# Patient Record
Sex: Male | Born: 1950 | Race: White | Hispanic: No | Marital: Married | Smoking: Never smoker
Health system: Southern US, Community
[De-identification: ages and names within clinical notes are randomized; demographics above are authoritative.]

## PROBLEM LIST (undated history)

## (undated) DIAGNOSIS — I10 Essential (primary) hypertension: Secondary | ICD-10-CM

## (undated) DIAGNOSIS — E785 Hyperlipidemia, unspecified: Secondary | ICD-10-CM

## (undated) DIAGNOSIS — K635 Polyp of colon: Secondary | ICD-10-CM

## (undated) HISTORY — DX: Essential (primary) hypertension: I10

## (undated) HISTORY — DX: Polyp of colon: K63.5

## (undated) HISTORY — DX: Hyperlipidemia, unspecified: E78.5

## (undated) HISTORY — PX: ANKLE FUSION: SHX881

---

## 2007-01-03 ENCOUNTER — Ambulatory Visit: Payer: Self-pay | Admitting: Nurse Practitioner

## 2012-11-05 DIAGNOSIS — Z981 Arthrodesis status: Secondary | ICD-10-CM | POA: Insufficient documentation

## 2012-11-05 DIAGNOSIS — M19072 Primary osteoarthritis, left ankle and foot: Secondary | ICD-10-CM | POA: Insufficient documentation

## 2016-10-11 ENCOUNTER — Encounter (HOSPITAL_COMMUNITY): Payer: Self-pay

## 2016-10-11 ENCOUNTER — Encounter (HOSPITAL_COMMUNITY): Payer: BLUE CROSS/BLUE SHIELD

## 2016-10-11 ENCOUNTER — Encounter (HOSPITAL_COMMUNITY): Payer: BLUE CROSS/BLUE SHIELD | Attending: Oncology | Admitting: Oncology

## 2016-10-11 DIAGNOSIS — D709 Neutropenia, unspecified: Secondary | ICD-10-CM | POA: Diagnosis present

## 2016-10-11 DIAGNOSIS — Z833 Family history of diabetes mellitus: Secondary | ICD-10-CM | POA: Diagnosis not present

## 2016-10-11 DIAGNOSIS — Z7982 Long term (current) use of aspirin: Secondary | ICD-10-CM | POA: Insufficient documentation

## 2016-10-11 DIAGNOSIS — D708 Other neutropenia: Secondary | ICD-10-CM

## 2016-10-11 DIAGNOSIS — Z9889 Other specified postprocedural states: Secondary | ICD-10-CM | POA: Insufficient documentation

## 2016-10-11 DIAGNOSIS — Z823 Family history of stroke: Secondary | ICD-10-CM | POA: Diagnosis not present

## 2016-10-11 DIAGNOSIS — Z79899 Other long term (current) drug therapy: Secondary | ICD-10-CM | POA: Insufficient documentation

## 2016-10-11 DIAGNOSIS — Z8249 Family history of ischemic heart disease and other diseases of the circulatory system: Secondary | ICD-10-CM | POA: Diagnosis not present

## 2016-10-11 DIAGNOSIS — E785 Hyperlipidemia, unspecified: Secondary | ICD-10-CM | POA: Insufficient documentation

## 2016-10-11 DIAGNOSIS — I10 Essential (primary) hypertension: Secondary | ICD-10-CM | POA: Diagnosis not present

## 2016-10-11 LAB — COMPREHENSIVE METABOLIC PANEL
ALK PHOS: 75 U/L (ref 38–126)
ALT: 18 U/L (ref 17–63)
AST: 24 U/L (ref 15–41)
Albumin: 3.8 g/dL (ref 3.5–5.0)
Anion gap: 7 (ref 5–15)
BILIRUBIN TOTAL: 0.8 mg/dL (ref 0.3–1.2)
BUN: 13 mg/dL (ref 6–20)
CALCIUM: 8.9 mg/dL (ref 8.9–10.3)
CO2: 25 mmol/L (ref 22–32)
CREATININE: 0.87 mg/dL (ref 0.61–1.24)
Chloride: 104 mmol/L (ref 101–111)
GFR calc Af Amer: >60 mL/min (ref >60)
GLUCOSE: 99 mg/dL (ref 65–99)
Potassium: 4.3 mmol/L (ref 3.5–5.1)
Sodium: 136 mmol/L (ref 135–145)
TOTAL PROTEIN: 6.8 g/dL (ref 6.5–8.1)

## 2016-10-11 LAB — CBC WITH DIFFERENTIAL/PLATELET
BASOS ABS: 0 10*3/uL (ref 0.0–0.1)
Basophils Relative: 1 %
Eosinophils Absolute: 0.2 10*3/uL (ref 0.0–0.7)
Eosinophils Relative: 5 %
HEMATOCRIT: 35.6 % — AB (ref 39.0–52.0)
HEMOGLOBIN: 12.2 g/dL — AB (ref 13.0–17.0)
LYMPHS PCT: 34 %
Lymphs Abs: 1 10*3/uL (ref 0.7–4.0)
MCH: 29.8 pg (ref 26.0–34.0)
MCHC: 34.3 g/dL (ref 30.0–36.0)
MCV: 86.8 fL (ref 78.0–100.0)
MONO ABS: 0.3 10*3/uL (ref 0.1–1.0)
Monocytes Relative: 11 %
NEUTROS ABS: 1.5 10*3/uL — AB (ref 1.7–7.7)
NEUTROS PCT: 49 %
Platelets: 265 10*3/uL (ref 150–400)
RBC: 4.1 MIL/uL — AB (ref 4.22–5.81)
RDW: 14.9 % (ref 11.5–15.5)
WBC: 3 10*3/uL — ABNORMAL LOW (ref 4.0–10.5)

## 2016-10-11 NOTE — Progress Notes (Signed)
[facility] Initial Visit:  Patient Care Team: Renee Rival, NP as PCP - General (Nurse Practitioner)  CHIEF COMPLAINTS/PURPOSE OF CONSULTATION:  Neutropenia  HISTORY OF PRESENTING ILLNESS: Jesus Cunningham 66 y.o. 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 Patient went to see his physician for routine follow-up and had a CBC performed on 08/29/16 which demonstrated WBC 2.9K, hemoglobin 13.7 g/dL, hematocrit 42.2%, MCV 85, platelet count 257, absolute neutrophils 1.3K, otherwise differential is normal. Review of his previous lab work stated back to November 2017 demonstrated that he has a chronic go Pini ranging between [place] to 4.2K with a neutropenia. Patient states that he does not get frequent or recurrent infections. Overall he feels well. He denies any night sweats, fevers or chills, weight loss, shortness breath, abdominal pain, chest pain, focal weakness.  Review of Systems  Constitutional: Negative for appetite change, chills, fatigue and fever.  HENT:   Negative for hearing loss, lump/mass, mouth sores, sore throat and tinnitus.   Eyes: Negative for eye problems and icterus.  Respiratory: Negative for chest tightness, cough, hemoptysis, shortness of breath and wheezing.   Cardiovascular: Negative for chest pain, leg swelling and palpitations.  Gastrointestinal: Negative for abdominal distention, abdominal pain, blood in stool, diarrhea, nausea and vomiting.  Endocrine: Negative.  Negative for hot flashes.  Genitourinary: Negative for difficulty urinating, frequency and hematuria.   Musculoskeletal: Negative for arthralgias and neck pain.  Skin: Negative for itching and rash.  Neurological: Negative for dizziness, headaches and speech difficulty.  Hematological: Negative for adenopathy. Does not bruise/bleed easily.  Psychiatric/Behavioral: Negative for confusion. The patient is not nervous/anxious.     MEDICAL HISTORY: Past Medical History:   Diagnosis Date  . Hyperlipidemia   . Hypertension     SURGICAL HISTORY: Past Surgical History:  Procedure Laterality Date  . ANKLE FUSION      SOCIAL HISTORY: Social History   Social History  . Marital status: Married    Spouse name: N/A  . Number of children: N/A  . Years of education: N/A   Occupational History  . Not on file.   Social History Main Topics  . Smoking status: Never Smoker  . Smokeless tobacco: Former Systems developer    Types: Chew    Quit date: 10/11/1981  . Alcohol use 2.4 oz/week    4 Glasses of wine per week  . Drug use: No  . Sexual activity: Not on file   Other Topics Concern  . Not on file   Social History Narrative  . No narrative on file    FAMILY HISTORY Family History  Problem Relation Age of Onset  . Stroke Mother   . Heart failure Father   . Diabetes Father   . Hypertension Brother     ALLERGIES:  has No Known Allergies.  MEDICATIONS:  Current Outpatient Prescriptions  Medication Sig Dispense Refill  . Ascorbic Acid (VITAMIN C) 1000 MG tablet Take 1,000 mg by mouth daily.    Marland Kitchen aspirin EC 81 MG tablet Take by mouth.    . Eluxadoline (VIBERZI) 75 MG TABS Take 75 mg by mouth daily.    Marland Kitchen lisinopril (PRINIVIL,ZESTRIL) 20 MG tablet Take by mouth.    . simvastatin (ZOCOR) 20 MG tablet Take by mouth.     No current facility-administered medications for this visit.     PHYSICAL EXAMINATION:  ECOG PERFORMANCE STATUS: 0 - Asymptomatic   Vitals:   10/11/16 0855  BP: (!) 145/77  Pulse: (!) 52  Resp: 16    Filed Weights   10/11/16 0855  Weight: 215 lb (97.5 kg)     Physical Exam  Constitutional: He is oriented to person, place, and time and well-developed, well-nourished, and in no distress. No distress.  HENT:  Head: Normocephalic and atraumatic.  Mouth/Throat: No oropharyngeal exudate.  Eyes: Conjunctivae are normal. Pupils are equal, round, and reactive to light. No scleral icterus.  Neck: Normal range of motion. Neck  supple. No JVD present.  Cardiovascular: Normal rate, regular rhythm and normal heart sounds.  Exam reveals no gallop and no friction rub.   No murmur heard. Pulmonary/Chest: Breath sounds normal. No respiratory distress. He has no wheezes. He has no rales.  Abdominal: Soft. Bowel sounds are normal. He exhibits no distension. There is no tenderness. There is no guarding.  Musculoskeletal: He exhibits no edema or tenderness.  Lymphadenopathy:    He has no cervical adenopathy.  Neurological: He is alert and oriented to person, place, and time. No cranial nerve deficit.  Skin: Skin is warm and dry. No rash noted. No erythema. No pallor.  Psychiatric: Affect and judgment normal.     LABORATORY DATA: I have personally reviewed the data as listed:  Appointment on 10/11/2016  Component Date Value Ref Range Status  . WBC 10/11/2016 3.0* 4.0 - 10.5 K/uL Final  . RBC 10/11/2016 4.10* 4.22 - 5.81 MIL/uL Final  . Hemoglobin 10/11/2016 12.2* 13.0 - 17.0 g/dL Final  . HCT 10/11/2016 35.6* 39.0 - 52.0 % Final  . MCV 10/11/2016 86.8  78.0 - 100.0 fL Final  . MCH 10/11/2016 29.8  26.0 - 34.0 pg Final  . MCHC 10/11/2016 34.3  30.0 - 36.0 g/dL Final  . RDW 10/11/2016 14.9  11.5 - 15.5 % Final  . Platelets 10/11/2016 265  150 - 400 K/uL Final  . Neutrophils Relative % 10/11/2016 49  % Final  . Neutro Abs 10/11/2016 1.5* 1.7 - 7.7 K/uL Final  . Lymphocytes Relative 10/11/2016 34  % Final  . Lymphs Abs 10/11/2016 1.0  0.7 - 4.0 K/uL Final  . Monocytes Relative 10/11/2016 11  % Final  . Monocytes Absolute 10/11/2016 0.3  0.1 - 1.0 K/uL Final  . Eosinophils Relative 10/11/2016 5  % Final  . Eosinophils Absolute 10/11/2016 0.2  0.0 - 0.7 K/uL Final  . Basophils Relative 10/11/2016 1  % Final  . Basophils Absolute 10/11/2016 0.0  0.0 - 0.1 K/uL Final  . Sodium 10/11/2016 136  135 - 145 mmol/L Final  . Potassium 10/11/2016 4.3  3.5 - 5.1 mmol/L Final  . Chloride 10/11/2016 104  101 - 111 mmol/L Final   . CO2 10/11/2016 25  22 - 32 mmol/L Final  . Glucose, Bld 10/11/2016 99  65 - 99 mg/dL Final  . BUN 10/11/2016 13  6 - 20 mg/dL Final  . Creatinine, Ser 10/11/2016 0.87  0.61 - 1.24 mg/dL Final  . Calcium 10/11/2016 8.9  8.9 - 10.3 mg/dL Final  . Total Protein 10/11/2016 6.8  6.5 - 8.1 g/dL Final  . Albumin 10/11/2016 3.8  3.5 - 5.0 g/dL Final  . AST 10/11/2016 24  15 - 41 U/L Final  . ALT 10/11/2016 18  17 - 63 U/L Final  . Alkaline Phosphatase 10/11/2016 75  38 - 126 U/L Final  . Total Bilirubin 10/11/2016 0.8  0.3 - 1.2 mg/dL Final  . GFR calc non Af Amer 10/11/2016 >60  >60 mL/min Final  . GFR calc Af Amer 10/11/2016 >60  >  60 mL/min Final   Comment: (NOTE) The eGFR has been calculated using the CKD EPI equation. This calculation has not been validated in all clinical situations. eGFR's persistently <60 mL/min signify possible Chronic Kidney Disease.   . Anion gap 10/11/2016 7  5 - 15 Final    RADIOGRAPHIC STUDIES: I have personally reviewed the radiological images as listed and agree with the findings in the report  No results found.  ASSESSMENT/PLAN Chronic mild leukopenia with neutropenia- suspect benign isolated neutropenia. Patient is asymptomatic from the neutropenia.  PLAN: Will check labs as stated below for workup of his neutropenia. RTC in 2 weeks to review labs.  Orders Placed This Encounter  Procedures  . CBC with Differential    Standing Status:   Future    Number of Occurrences:   1    Standing Expiration Date:   10/11/2017  . Comprehensive metabolic panel    Standing Status:   Future    Number of Occurrences:   1    Standing Expiration Date:   10/11/2017  . HIV antibody (with reflex)  . Hepatitis panel, acute    Standing Status:   Future    Number of Occurrences:   1    Standing Expiration Date:   10/11/2017  . Copper, serum    Standing Status:   Future    Number of Occurrences:   1    Standing Expiration Date:   10/11/2017    All questions were  answered. The patient knows to call the clinic with any problems, questions or concerns.  This note was electronically signed.    Twana First, MD  10/11/2016 12:30 PM

## 2016-10-12 LAB — HEPATITIS PANEL, ACUTE
HCV AB: 0.1 {s_co_ratio} (ref 0.0–0.9)
HEP A IGM: NEGATIVE
Hep B C IgM: NEGATIVE
Hepatitis B Surface Ag: NEGATIVE

## 2016-10-12 LAB — HIV ANTIBODY (ROUTINE TESTING W REFLEX): HIV Screen 4th Generation wRfx: NONREACTIVE

## 2016-10-13 LAB — COPPER, SERUM: COPPER: 102 ug/dL (ref 72–166)

## 2016-10-25 ENCOUNTER — Encounter (HOSPITAL_COMMUNITY): Payer: BLUE CROSS/BLUE SHIELD | Attending: Oncology | Admitting: Oncology

## 2016-10-25 ENCOUNTER — Encounter (HOSPITAL_COMMUNITY): Payer: Self-pay

## 2016-10-25 VITALS — BP 134/84 | HR 57 | Resp 16 | Ht 72.0 in | Wt 210.0 lb

## 2016-10-25 DIAGNOSIS — I10 Essential (primary) hypertension: Secondary | ICD-10-CM | POA: Insufficient documentation

## 2016-10-25 DIAGNOSIS — E785 Hyperlipidemia, unspecified: Secondary | ICD-10-CM | POA: Insufficient documentation

## 2016-10-25 DIAGNOSIS — Z7982 Long term (current) use of aspirin: Secondary | ICD-10-CM | POA: Insufficient documentation

## 2016-10-25 DIAGNOSIS — D72819 Decreased white blood cell count, unspecified: Secondary | ICD-10-CM

## 2016-10-25 DIAGNOSIS — D709 Neutropenia, unspecified: Secondary | ICD-10-CM | POA: Insufficient documentation

## 2016-10-25 DIAGNOSIS — Z9889 Other specified postprocedural states: Secondary | ICD-10-CM | POA: Insufficient documentation

## 2016-10-25 DIAGNOSIS — D708 Other neutropenia: Secondary | ICD-10-CM

## 2016-10-25 DIAGNOSIS — Z833 Family history of diabetes mellitus: Secondary | ICD-10-CM | POA: Insufficient documentation

## 2016-10-25 DIAGNOSIS — Z8249 Family history of ischemic heart disease and other diseases of the circulatory system: Secondary | ICD-10-CM | POA: Insufficient documentation

## 2016-10-25 DIAGNOSIS — Z823 Family history of stroke: Secondary | ICD-10-CM | POA: Insufficient documentation

## 2016-10-25 DIAGNOSIS — Z79899 Other long term (current) drug therapy: Secondary | ICD-10-CM | POA: Insufficient documentation

## 2016-10-26 NOTE — Progress Notes (Signed)
[facility] Follow Up Visit:  Patient Care Team: Renee Rival, NP as PCP - General (Nurse Practitioner)  CHIEF COMPLAINTS/PURPOSE OF CONSULTATION:  Neutropenia  HISTORY OF PRESENTING ILLNESS: Jesus Cunningham 65 y.o. 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 Patient went to see his physician for routine follow-up and had a CBC performed on 08/29/16 which demonstrated WBC 2.9K, hemoglobin 13.7 g/dL, hematocrit 42.2%, MCV 85, platelet count 257, absolute neutrophils 1.3K, otherwise differential is normal. Review of his previous lab work stated back to November 2017 demonstrated that he has a chronic go Pini ranging between [place] to 4.2K with a neutropenia. Patient states that he does not get frequent or recurrent infections. Overall he feels well. He denies any night sweats, fevers or chills, weight loss, shortness breath, abdominal pain, chest pain, focal weakness.  INTERVAL HISTORY: Patient presented for follow-up and review of his previous leukopenia workup on 10/11/16. CBC revealed a WBC of 3.0K, hemoglobin 12.2 g/dL, hematocrit 35.6%, plate count 119E, ANC 1.5K. CMP, acute hepatitis panel, HIV were all within normal limits. Today patient states that he feels well and has not had any infections since his last visit. He has no complaints today.  Review of Systems  Constitutional: Negative for appetite change, chills, fatigue and fever.  HENT:   Negative for hearing loss, lump/mass, mouth sores, sore throat and tinnitus.   Eyes: Negative for eye problems and icterus.  Respiratory: Negative for chest tightness, cough, hemoptysis, shortness of breath and wheezing.   Cardiovascular: Negative for chest pain, leg swelling and palpitations.  Gastrointestinal: Negative for abdominal distention, abdominal pain, blood in stool, diarrhea, nausea and vomiting.  Endocrine: Negative.  Negative for hot flashes.  Genitourinary: Negative for difficulty urinating, frequency and  hematuria.   Musculoskeletal: Negative for arthralgias and neck pain.  Skin: Negative for itching and rash.  Neurological: Negative for dizziness, headaches and speech difficulty.  Hematological: Negative for adenopathy. Does not bruise/bleed easily.  Psychiatric/Behavioral: Negative for confusion. The patient is not nervous/anxious.     MEDICAL HISTORY: Past Medical History:  Diagnosis Date  . Hyperlipidemia   . Hypertension     SURGICAL HISTORY: Past Surgical History:  Procedure Laterality Date  . ANKLE FUSION      SOCIAL HISTORY: Social History   Social History  . Marital status: Married    Spouse name: N/A  . Number of children: N/A  . Years of education: N/A   Occupational History  . Not on file.   Social History Main Topics  . Smoking status: Never Smoker  . Smokeless tobacco: Former Systems developer    Types: Chew    Quit date: 10/11/1981  . Alcohol use 2.4 oz/week    4 Glasses of wine per week  . Drug use: No  . Sexual activity: Not on file   Other Topics Concern  . Not on file   Social History Narrative  . No narrative on file    FAMILY HISTORY Family History  Problem Relation Age of Onset  . Stroke Mother   . Heart failure Father   . Diabetes Father   . Hypertension Brother     ALLERGIES:  has No Known Allergies.  MEDICATIONS:  Current Outpatient Prescriptions  Medication Sig Dispense Refill  . Ascorbic Acid (VITAMIN C) 1000 MG tablet Take 1,000 mg by mouth daily.    Marland Kitchen aspirin EC 81 MG tablet Take by mouth.    . Eluxadoline (VIBERZI) 75 MG TABS Take 75 mg by mouth  daily.    . lisinopril (PRINIVIL,ZESTRIL) 20 MG tablet Take by mouth.    . simvastatin (ZOCOR) 20 MG tablet Take by mouth.     No current facility-administered medications for this visit.     PHYSICAL EXAMINATION:  ECOG PERFORMANCE STATUS: 0 - Asymptomatic   Vitals:   10/25/16 0941  BP: 134/84  Pulse: (!) 57  Resp: 16    Filed Weights   10/25/16 0941  Weight: 210 lb (95.3  kg)     Physical Exam  Constitutional: He is oriented to person, place, and time and well-developed, well-nourished, and in no distress. No distress.  HENT:  Head: Normocephalic and atraumatic.  Mouth/Throat: No oropharyngeal exudate.  Eyes: Pupils are equal, round, and reactive to light. Conjunctivae are normal. No scleral icterus.  Neck: Normal range of motion. Neck supple. No JVD present.  Cardiovascular: Normal rate, regular rhythm and normal heart sounds.  Exam reveals no gallop and no friction rub.   No murmur heard. Pulmonary/Chest: Breath sounds normal. No respiratory distress. He has no wheezes. He has no rales.  Abdominal: Soft. Bowel sounds are normal. He exhibits no distension. There is no tenderness. There is no guarding.  Musculoskeletal: He exhibits no edema or tenderness.  Lymphadenopathy:    He has no cervical adenopathy.  Neurological: He is alert and oriented to person, place, and time. No cranial nerve deficit.  Skin: Skin is warm and dry. No rash noted. No erythema. No pallor.  Psychiatric: Affect and judgment normal.     LABORATORY DATA: I have personally reviewed the data as listed:  Appointment on 10/11/2016  Component Date Value Ref Range Status  . WBC 10/11/2016 3.0* 4.0 - 10.5 K/uL Final  . RBC 10/11/2016 4.10* 4.22 - 5.81 MIL/uL Final  . Hemoglobin 10/11/2016 12.2* 13.0 - 17.0 g/dL Final  . HCT 10/11/2016 35.6* 39.0 - 52.0 % Final  . MCV 10/11/2016 86.8  78.0 - 100.0 fL Final  . MCH 10/11/2016 29.8  26.0 - 34.0 pg Final  . MCHC 10/11/2016 34.3  30.0 - 36.0 g/dL Final  . RDW 10/11/2016 14.9  11.5 - 15.5 % Final  . Platelets 10/11/2016 265  150 - 400 K/uL Final  . Neutrophils Relative % 10/11/2016 49  % Final  . Neutro Abs 10/11/2016 1.5* 1.7 - 7.7 K/uL Final  . Lymphocytes Relative 10/11/2016 34  % Final  . Lymphs Abs 10/11/2016 1.0  0.7 - 4.0 K/uL Final  . Monocytes Relative 10/11/2016 11  % Final  . Monocytes Absolute 10/11/2016 0.3  0.1 - 1.0  K/uL Final  . Eosinophils Relative 10/11/2016 5  % Final  . Eosinophils Absolute 10/11/2016 0.2  0.0 - 0.7 K/uL Final  . Basophils Relative 10/11/2016 1  % Final  . Basophils Absolute 10/11/2016 0.0  0.0 - 0.1 K/uL Final  . Sodium 10/11/2016 136  135 - 145 mmol/L Final  . Potassium 10/11/2016 4.3  3.5 - 5.1 mmol/L Final  . Chloride 10/11/2016 104  101 - 111 mmol/L Final  . CO2 10/11/2016 25  22 - 32 mmol/L Final  . Glucose, Bld 10/11/2016 99  65 - 99 mg/dL Final  . BUN 10/11/2016 13  6 - 20 mg/dL Final  . Creatinine, Ser 10/11/2016 0.87  0.61 - 1.24 mg/dL Final  . Calcium 10/11/2016 8.9  8.9 - 10.3 mg/dL Final  . Total Protein 10/11/2016 6.8  6.5 - 8.1 g/dL Final  . Albumin 10/11/2016 3.8  3.5 - 5.0 g/dL Final  . AST  10/11/2016 24  15 - 41 U/L Final  . ALT 10/11/2016 18  17 - 63 U/L Final  . Alkaline Phosphatase 10/11/2016 75  38 - 126 U/L Final  . Total Bilirubin 10/11/2016 0.8  0.3 - 1.2 mg/dL Final  . GFR calc non Af Amer 10/11/2016 >60  >60 mL/min Final  . GFR calc Af Amer 10/11/2016 >60  >60 mL/min Final   Comment: (NOTE) The eGFR has been calculated using the CKD EPI equation. This calculation has not been validated in all clinical situations. eGFR's persistently <60 mL/min signify possible Chronic Kidney Disease.   . Anion gap 10/11/2016 7  5 - 15 Final  . Hepatitis B Surface Ag 10/11/2016 Negative  Negative Final  . HCV Ab 10/11/2016 0.1  0.0 - 0.9 s/co ratio Final   Comment: (NOTE)                                  Negative:     < 0.8                             Indeterminate: 0.8 - 0.9                                  Positive:     > 0.9 The CDC recommends that a positive HCV antibody result be followed up with a HCV Nucleic Acid Amplification test (326712). Performed At: [facility] [place], [place] [id number] Lindon Romp MD [id number]   . Hep A IgM 10/11/2016 Negative  Negative Final  . Hep B C IgM 10/11/2016 Negative  Negative  Final  . Copper 10/11/2016 102  72 - 166 ug/dL Final   Comment: (NOTE)                                Detection Limit = 5 Performed At: [facility] [place] [facility] [place], [place] [id number] Lindon Romp MD [id number]   Office Visit on 10/11/2016  Component Date Value Ref Range Status  . HIV Screen 4th Generation wRfx 10/11/2016 Non Reactive  Non Reactive Final   Comment: (NOTE) Performed At: [facility] [place], [place] [id number] Lindon Romp MD [id number]     RADIOGRAPHIC STUDIES: I have personally reviewed the radiological images as listed and agree with the findings in the report  No results found.  ASSESSMENT/PLAN Chronic mild leukopenia with neutropenia- suspect benign isolated neutropenia. Patient is asymptomatic from the neutropenia.  PLAN: Reviewed his labs with him in detail. Mild neutropenia stable. Recommend to continue observation at this time. RTC in 6 months for follow up with labs.  Orders Placed This Encounter  Procedures  . CBC with Differential    Standing Status:   Future    Standing Expiration Date:   10/25/2017  . Comprehensive metabolic panel    Standing Status:   Future    Standing Expiration Date:   10/25/2017    All questions were answered. The patient knows to call the clinic with any problems, questions or concerns.  This note was electronically signed.    Twana First, MD  10/26/2016 8:53 AM

## 2017-04-27 ENCOUNTER — Other Ambulatory Visit (HOSPITAL_COMMUNITY): Payer: BLUE CROSS/BLUE SHIELD

## 2017-04-27 ENCOUNTER — Ambulatory Visit (HOSPITAL_COMMUNITY): Payer: BLUE CROSS/BLUE SHIELD

## 2017-05-01 ENCOUNTER — Inpatient Hospital Stay (HOSPITAL_COMMUNITY): Payer: BLUE CROSS/BLUE SHIELD | Attending: Oncology

## 2017-05-01 ENCOUNTER — Other Ambulatory Visit: Payer: Self-pay

## 2017-05-01 ENCOUNTER — Inpatient Hospital Stay (HOSPITAL_BASED_OUTPATIENT_CLINIC_OR_DEPARTMENT_OTHER): Payer: BLUE CROSS/BLUE SHIELD | Admitting: Oncology

## 2017-05-01 ENCOUNTER — Encounter (HOSPITAL_COMMUNITY): Payer: Self-pay | Admitting: Oncology

## 2017-05-01 VITALS — BP 132/97 | HR 68 | Temp 98.1°F | Resp 18 | Ht 72.0 in | Wt 206.6 lb

## 2017-05-01 DIAGNOSIS — D709 Neutropenia, unspecified: Secondary | ICD-10-CM

## 2017-05-01 DIAGNOSIS — D708 Other neutropenia: Secondary | ICD-10-CM

## 2017-05-01 LAB — CBC WITH DIFFERENTIAL/PLATELET
BASOS ABS: 0 10*3/uL (ref 0.0–0.1)
Basophils Relative: 0 %
EOS ABS: 0.2 10*3/uL (ref 0.0–0.7)
EOS PCT: 3 %
HCT: 43.2 % (ref 39.0–52.0)
Hemoglobin: 14.2 g/dL (ref 13.0–17.0)
Lymphocytes Relative: 27 %
Lymphs Abs: 1.4 10*3/uL (ref 0.7–4.0)
MCH: 29 pg (ref 26.0–34.0)
MCHC: 32.9 g/dL (ref 30.0–36.0)
MCV: 88.2 fL (ref 78.0–100.0)
Monocytes Absolute: 0.7 10*3/uL (ref 0.1–1.0)
Monocytes Relative: 14 %
Neutro Abs: 3 10*3/uL (ref 1.7–7.7)
Neutrophils Relative %: 56 %
PLATELETS: 289 10*3/uL (ref 150–400)
RBC: 4.9 MIL/uL (ref 4.22–5.81)
RDW: 14.7 % (ref 11.5–15.5)
WBC: 5.3 10*3/uL (ref 4.0–10.5)

## 2017-05-01 LAB — COMPREHENSIVE METABOLIC PANEL
ALT: 19 U/L (ref 17–63)
AST: 25 U/L (ref 15–41)
Albumin: 4 g/dL (ref 3.5–5.0)
Alkaline Phosphatase: 85 U/L (ref 38–126)
Anion gap: 10 (ref 5–15)
BUN: 18 mg/dL (ref 6–20)
CHLORIDE: 102 mmol/L (ref 101–111)
CO2: 24 mmol/L (ref 22–32)
CREATININE: 0.84 mg/dL (ref 0.61–1.24)
Calcium: 9.3 mg/dL (ref 8.9–10.3)
GFR calc non Af Amer: >60 mL/min (ref >60)
Glucose, Bld: 101 mg/dL — ABNORMAL HIGH (ref 65–99)
Potassium: 4.4 mmol/L (ref 3.5–5.1)
SODIUM: 136 mmol/L (ref 135–145)
Total Bilirubin: 0.7 mg/dL (ref 0.3–1.2)
Total Protein: 7.6 g/dL (ref 6.5–8.1)

## 2017-05-01 NOTE — Patient Instructions (Signed)
[facility] at [facility] Discharge Instructions  RECOMMENDATIONS MADE BY THE CONSULTANT AND ANY TEST RESULTS WILL BE SENT TO YOUR REFERRING PHYSICIAN.  You were seen today by Jenny Burns, NP  Thank you for choosing [facility] at [facility] to provide your oncology and hematology care.  To afford each patient quality time with our provider, please arrive at least 15 minutes before your scheduled appointment time.    If you have a lab appointment with the Cancer Center please come in thru the  Main Entrance and check in at the main information desk  You need to re-schedule your appointment should you arrive 10 or more minutes late.  We strive to give you quality time with our providers, and arriving late affects you and other patients whose appointments are after yours.  Also, if you no show three or more times for appointments you may be dismissed from the clinic at the providers discretion.     Again, thank you for choosing [facility].  Our hope is that these requests will decrease the amount of time that you wait before being seen by our physicians.       _____________________________________________________________  Should you have questions after your visit to [facility], please contact our office at [phone] between the hours of 8:30 a.m. and 4:30 p.m.  Voicemails left after 4:30 p.m. will not be returned until the following business day.  For prescription refill requests, have your pharmacy contact our office.       Resources For Cancer Patients and their Caregivers ? American Cancer Society: Can assist with transportation, wigs, general needs, runs Look Good Feel Better.        1-888-227-6333 ? Cancer Care: Provides financial assistance, online support groups, medication/co-pay assistance.  1-800-813-HOPE (4673) ? [facility] Assists [place] cancer patients and their  families through emotional , educational and financial support.  336-427-4357 ? [place] DSS Where to apply for food stamps, Medicaid and utility assistance. 336-342-1394 ? [facility]: Transportation to medical appointments. 336-347-2287 ? Social Security Administration: May apply for disability if have a Stage IV cancer. 336-342-7796 1-800-772-1213 ? [place] Aging, Disability and Transit Services: Assists with nutrition, care and transit needs. 336-349-2343  Cancer Center Support Programs: @[email or web address]@ > Cancer Support Group  2nd Tuesday of the month 1pm-2pm, Journey Room  > Creative Journey  3rd Tuesday of the month 1130am-1pm, Journey Room  > Look Good Feel Better  1st Wednesday of the month 10am-12 noon, Journey Room (Call American Cancer Society to register 1-800-395-5775)    

## 2017-05-01 NOTE — Progress Notes (Signed)
[facility] Follow Up Visit:  Patient Care Team: Renee Rival, NP as PCP - General (Nurse Practitioner)  CHIEF COMPLAINTS/PURPOSE OF CONSULTATION:  Neutropenia  HISTORY OF PRESENTING ILLNESS: Jesus Cunningham 67 y.o. 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  Patient was found to have several low white blood cell levels on a series of lab checks at PCPs office.  Initial white blood cell was 2.9K.  Otherwise CBC and differential was normal.  Once reviewed it was determined he had chronic neutropenia ranging between 3K to 4.2K for approximately 6 months.  He denied any frequent or recurrent infections.  He overall felt well.  He denies any night sweats, fevers or chills, weight loss, shortness of breath, abdominal pain, chest pain or weakness.  At last visit, Dr. Talbert Cage checked HIV and hepatitis A B andC. They were all negative.  INTERVAL HISTORY: Today the patient presents for follow-up and review of his labs.  CBC reveals white blood cell count of 5.3, hemoglobin 14.2 and hematocrit of 43.2% platelet count is 280K.  Patient states overall he feels great.    Review of Systems  Constitutional: Negative for appetite change, chills, fatigue and fever.  HENT:   Negative for hearing loss, lump/mass, mouth sores, sore throat and tinnitus.   Eyes: Negative for eye problems and icterus.  Respiratory: Negative for chest tightness, cough, hemoptysis, shortness of breath and wheezing.   Cardiovascular: Negative for chest pain, leg swelling and palpitations.  Gastrointestinal: Negative for abdominal distention, abdominal pain, blood in stool, diarrhea, nausea and vomiting.  Endocrine: Negative.  Negative for hot flashes.  Genitourinary: Negative for difficulty urinating, frequency and hematuria.   Musculoskeletal: Negative for arthralgias and neck pain.  Skin: Negative for itching and rash.  Neurological: Negative for dizziness, headaches and speech difficulty.   Hematological: Negative for adenopathy. Does not bruise/bleed easily.  Psychiatric/Behavioral: Negative for confusion. The patient is not nervous/anxious.     MEDICAL HISTORY: Past Medical History:  Diagnosis Date  . Hyperlipidemia   . Hypertension     SURGICAL HISTORY: Past Surgical History:  Procedure Laterality Date  . ANKLE FUSION      SOCIAL HISTORY: Social History   Socioeconomic History  . Marital status: Married    Spouse name: Not on file  . Number of children: Not on file  . Years of education: Not on file  . Highest education level: Not on file  Social Needs  . Financial resource strain: Not on file  . Food insecurity - worry: Not on file  . Food insecurity - inability: Not on file  . Transportation needs - medical: Not on file  . Transportation needs - non-medical: Not on file  Occupational History  . Not on file  Tobacco Use  . Smoking status: Never Smoker  . Smokeless tobacco: Former Systems developer    Types: Chew  Substance and Sexual Activity  . Alcohol use: Yes    Alcohol/week: 2.4 oz    Types: 4 Glasses of wine per week  . Drug use: No  . Sexual activity: Not on file  Other Topics Concern  . Not on file  Social History Narrative  . Not on file    FAMILY HISTORY Family History  Problem Relation Age of Onset  . Stroke Mother   . Heart failure Father   . Diabetes Father   . Hypertension Brother     ALLERGIES:  has No Known Allergies.  MEDICATIONS:  Current Outpatient Medications  Medication  Sig Dispense Refill  . Ascorbic Acid (VITAMIN C) 1000 MG tablet Take 1,000 mg by mouth daily.    Marland Kitchen aspirin EC 81 MG tablet Take by mouth.    . Eluxadoline (VIBERZI) 75 MG TABS Take 75 mg by mouth daily.    Marland Kitchen lisinopril (PRINIVIL,ZESTRIL) 20 MG tablet Take by mouth.    . simvastatin (ZOCOR) 20 MG tablet Take by mouth.     No current facility-administered medications for this visit.     PHYSICAL EXAMINATION:  ECOG PERFORMANCE STATUS: 0 -  Asymptomatic   Vitals:   05/01/17 1049  BP: (!) 132/97  Pulse: 68  Resp: 18  Temp: 98.1 F (36.7 C)  SpO2: 98%    Filed Weights   05/01/17 1049  Weight: 206 lb 9.6 oz (93.7 kg)     Physical Exam  Constitutional: He is oriented to person, place, and time and well-developed, well-nourished, and in no distress. No distress.  HENT:  Head: Normocephalic and atraumatic.  Mouth/Throat: No oropharyngeal exudate.  Eyes: Conjunctivae are normal. Pupils are equal, round, and reactive to light. No scleral icterus.  Neck: Normal range of motion. Neck supple. No JVD present.  Cardiovascular: Normal rate, regular rhythm and normal heart sounds. Exam reveals no gallop and no friction rub.  No murmur heard. Pulmonary/Chest: Breath sounds normal. No respiratory distress. He has no wheezes. He has no rales.  Abdominal: Soft. Bowel sounds are normal. He exhibits no distension. There is no tenderness. There is no guarding.  Musculoskeletal: He exhibits no edema or tenderness.  Lymphadenopathy:    He has no cervical adenopathy.  Neurological: He is alert and oriented to person, place, and time. No cranial nerve deficit.  Skin: Skin is warm and dry. No rash noted. No erythema. No pallor.  Psychiatric: Affect and judgment normal.     LABORATORY DATA: I have personally reviewed the data as listed:  Appointment on 05/01/2017  Component Date Value Ref Range Status  . WBC 05/01/2017 5.3  4.0 - 10.5 K/uL Final  . RBC 05/01/2017 4.90  4.22 - 5.81 MIL/uL Final  . Hemoglobin 05/01/2017 14.2  13.0 - 17.0 g/dL Final  . HCT 05/01/2017 43.2  39.0 - 52.0 % Final  . MCV 05/01/2017 88.2  78.0 - 100.0 fL Final  . MCH 05/01/2017 29.0  26.0 - 34.0 pg Final  . MCHC 05/01/2017 32.9  30.0 - 36.0 g/dL Final  . RDW 05/01/2017 14.7  11.5 - 15.5 % Final  . Platelets 05/01/2017 289  150 - 400 K/uL Final  . Neutrophils Relative % 05/01/2017 56  % Final  . Neutro Abs 05/01/2017 3.0  1.7 - 7.7 K/uL Final  .  Lymphocytes Relative 05/01/2017 27  % Final  . Lymphs Abs 05/01/2017 1.4  0.7 - 4.0 K/uL Final  . Monocytes Relative 05/01/2017 14  % Final  . Monocytes Absolute 05/01/2017 0.7  0.1 - 1.0 K/uL Final  . Eosinophils Relative 05/01/2017 3  % Final  . Eosinophils Absolute 05/01/2017 0.2  0.0 - 0.7 K/uL Final  . Basophils Relative 05/01/2017 0  % Final  . Basophils Absolute 05/01/2017 0.0  0.0 - 0.1 K/uL Final  . Sodium 05/01/2017 136  135 - 145 mmol/L Final  . Potassium 05/01/2017 4.4  3.5 - 5.1 mmol/L Final  . Chloride 05/01/2017 102  101 - 111 mmol/L Final  . CO2 05/01/2017 24  22 - 32 mmol/L Final  . Glucose, Bld 05/01/2017 101* 65 - 99 mg/dL Final  . BUN  05/01/2017 18  6 - 20 mg/dL Final  . Creatinine, Ser 05/01/2017 0.84  0.61 - 1.24 mg/dL Final  . Calcium 05/01/2017 9.3  8.9 - 10.3 mg/dL Final  . Total Protein 05/01/2017 7.6  6.5 - 8.1 g/dL Final  . Albumin 05/01/2017 4.0  3.5 - 5.0 g/dL Final  . AST 05/01/2017 25  15 - 41 U/L Final  . ALT 05/01/2017 19  17 - 63 U/L Final  . Alkaline Phosphatase 05/01/2017 85  38 - 126 U/L Final  . Total Bilirubin 05/01/2017 0.7  0.3 - 1.2 mg/dL Final  . GFR calc non Af Amer 05/01/2017 >60  >60 mL/min Final  . GFR calc Af Amer 05/01/2017 >60  >60 mL/min Final   Comment: (NOTE) The eGFR has been calculated using the CKD EPI equation. This calculation has not been validated in all clinical situations. eGFR's persistently <60 mL/min signify possible Chronic Kidney Disease.   . Anion gap 05/01/2017 10  5 - 15 Final    RADIOGRAPHIC STUDIES: I have personally reviewed the radiological images as listed and agree with the findings in the report  No results found.  ASSESSMENT/PLAN Chronic mild leukopenia with neutropenia- suspect benign isolated neutropenia. Patient is asymptomatic from the neutropenia.  PLAN: Reviewed his labs with him in detail.  Provided him a copy.  Neutropenia has resolved.  WBCs today is 5.3.  Recommend continued  observation.  Return to clinic in 6 months for follow-up and labs.  No orders of the defined types were placed in this encounter.   All questions were answered. The patient knows to call the clinic with any problems, questions or concerns.  Greater than 50% was spent in counseling and coordination of care with this patient including but not limited to discussion of the relevant topics above (See A&P) including, but not limited to diagnosis and management of acute and chronic medical conditions.   This note was electronically signed.    Jacquelin Hawking, NP  05/01/2017 1:45 PM

## 2017-10-26 ENCOUNTER — Other Ambulatory Visit (HOSPITAL_COMMUNITY): Payer: Self-pay

## 2017-10-26 DIAGNOSIS — D708 Other neutropenia: Secondary | ICD-10-CM

## 2017-10-29 ENCOUNTER — Ambulatory Visit (HOSPITAL_COMMUNITY): Payer: BLUE CROSS/BLUE SHIELD

## 2017-10-29 ENCOUNTER — Other Ambulatory Visit (HOSPITAL_COMMUNITY): Payer: BLUE CROSS/BLUE SHIELD

## 2017-10-29 ENCOUNTER — Ambulatory Visit (HOSPITAL_COMMUNITY): Payer: BLUE CROSS/BLUE SHIELD | Admitting: Hematology

## 2021-03-27 ENCOUNTER — Other Ambulatory Visit: Payer: Self-pay

## 2021-03-27 ENCOUNTER — Inpatient Hospital Stay (HOSPITAL_COMMUNITY)
Admission: EM | Admit: 2021-03-27 | Discharge: 2021-04-03 | DRG: 234 | Disposition: A | Discharge status: Home / Self Care | Payer: BC Managed Care – PPO | Attending: Thoracic Surgery (Cardiothoracic Vascular Surgery) | Admitting: Thoracic Surgery (Cardiothoracic Vascular Surgery)

## 2021-03-27 ENCOUNTER — Emergency Department (HOSPITAL_COMMUNITY): Payer: BC Managed Care – PPO

## 2021-03-27 ENCOUNTER — Encounter (HOSPITAL_COMMUNITY): Payer: Self-pay

## 2021-03-27 DIAGNOSIS — I214 Non-ST elevation (NSTEMI) myocardial infarction: Principal | ICD-10-CM | POA: Diagnosis present

## 2021-03-27 DIAGNOSIS — E8770 Fluid overload, unspecified: Secondary | ICD-10-CM | POA: Diagnosis present

## 2021-03-27 DIAGNOSIS — K449 Diaphragmatic hernia without obstruction or gangrene: Secondary | ICD-10-CM | POA: Diagnosis present

## 2021-03-27 DIAGNOSIS — Z4682 Encounter for fitting and adjustment of non-vascular catheter: Secondary | ICD-10-CM

## 2021-03-27 DIAGNOSIS — I25110 Atherosclerotic heart disease of native coronary artery with unstable angina pectoris: Secondary | ICD-10-CM | POA: Diagnosis present

## 2021-03-27 DIAGNOSIS — E876 Hypokalemia: Secondary | ICD-10-CM | POA: Diagnosis present

## 2021-03-27 DIAGNOSIS — Z7982 Long term (current) use of aspirin: Secondary | ICD-10-CM

## 2021-03-27 DIAGNOSIS — R079 Chest pain, unspecified: Secondary | ICD-10-CM | POA: Diagnosis not present

## 2021-03-27 DIAGNOSIS — D62 Acute posthemorrhagic anemia: Secondary | ICD-10-CM | POA: Diagnosis not present

## 2021-03-27 DIAGNOSIS — Z951 Presence of aortocoronary bypass graft: Secondary | ICD-10-CM

## 2021-03-27 DIAGNOSIS — I10 Essential (primary) hypertension: Secondary | ICD-10-CM | POA: Diagnosis present

## 2021-03-27 DIAGNOSIS — E785 Hyperlipidemia, unspecified: Secondary | ICD-10-CM | POA: Diagnosis present

## 2021-03-27 DIAGNOSIS — Z978 Presence of other specified devices: Secondary | ICD-10-CM

## 2021-03-27 DIAGNOSIS — J939 Pneumothorax, unspecified: Secondary | ICD-10-CM

## 2021-03-27 DIAGNOSIS — I4891 Unspecified atrial fibrillation: Secondary | ICD-10-CM | POA: Diagnosis present

## 2021-03-27 DIAGNOSIS — Z87891 Personal history of nicotine dependence: Secondary | ICD-10-CM

## 2021-03-27 DIAGNOSIS — Z20822 Contact with and (suspected) exposure to covid-19: Secondary | ICD-10-CM | POA: Diagnosis present

## 2021-03-27 DIAGNOSIS — Z791 Long term (current) use of non-steroidal anti-inflammatories (NSAID): Secondary | ICD-10-CM

## 2021-03-27 DIAGNOSIS — Z8249 Family history of ischemic heart disease and other diseases of the circulatory system: Secondary | ICD-10-CM

## 2021-03-27 DIAGNOSIS — K589 Irritable bowel syndrome without diarrhea: Secondary | ICD-10-CM | POA: Diagnosis present

## 2021-03-27 DIAGNOSIS — Z09 Encounter for follow-up examination after completed treatment for conditions other than malignant neoplasm: Secondary | ICD-10-CM

## 2021-03-27 DIAGNOSIS — Z419 Encounter for procedure for purposes other than remedying health state, unspecified: Secondary | ICD-10-CM

## 2021-03-27 DIAGNOSIS — R0603 Acute respiratory distress: Secondary | ICD-10-CM

## 2021-03-27 DIAGNOSIS — Z79899 Other long term (current) drug therapy: Secondary | ICD-10-CM

## 2021-03-27 LAB — CBC
HCT: 46.4 % (ref 39.0–52.0)
Hemoglobin: 16.4 g/dL (ref 13.0–17.0)
MCH: 32.9 pg (ref 26.0–34.0)
MCHC: 35.3 g/dL (ref 30.0–36.0)
MCV: 93.2 fL (ref 80.0–100.0)
Platelets: 315 10*3/uL (ref 150–400)
RBC: 4.98 MIL/uL (ref 4.22–5.81)
RDW: 12.6 % (ref 11.5–15.5)
WBC: 5.1 10*3/uL (ref 4.0–10.5)
nRBC: 0 % (ref 0.0–0.2)

## 2021-03-27 LAB — BASIC METABOLIC PANEL
Anion gap: 10 (ref 5–15)
BUN: 10 mg/dL (ref 8–23)
CO2: 25 mmol/L (ref 22–32)
Calcium: 9.4 mg/dL (ref 8.9–10.3)
Chloride: 100 mmol/L (ref 98–111)
Creatinine, Ser: 1.02 mg/dL (ref 0.61–1.24)
GFR, Estimated: >60 mL/min (ref >60)
Glucose, Bld: 141 mg/dL — ABNORMAL HIGH (ref 70–99)
Potassium: 3.2 mmol/L — ABNORMAL LOW (ref 3.5–5.1)
Sodium: 135 mmol/L (ref 135–145)

## 2021-03-27 LAB — TROPONIN I (HIGH SENSITIVITY)
Troponin I (High Sensitivity): 33 ng/L — ABNORMAL HIGH (ref <18)
Troponin I (High Sensitivity): 48 ng/L — ABNORMAL HIGH (ref <18)
Troponin I (High Sensitivity): 73 ng/L — ABNORMAL HIGH (ref <18)
Troponin I (High Sensitivity): 56 ng/L — ABNORMAL HIGH (ref <18)

## 2021-03-27 LAB — RESP PANEL BY RT-PCR (FLU A&B, COVID) ARPGX2
Influenza A by PCR: NEGATIVE
Influenza B by PCR: NEGATIVE
SARS Coronavirus 2 by RT PCR: NEGATIVE

## 2021-03-27 MED ORDER — ONDANSETRON HCL 4 MG/2ML IJ SOLN: 4.0000 mg | Freq: Four times a day (QID) | INTRAMUSCULAR | Status: DC | PRN

## 2021-03-27 MED ORDER — LISINOPRIL-HYDROCHLOROTHIAZIDE 20-12.5 MG PO TABS: 1.0000 | ORAL_TABLET | Freq: Every day | ORAL | Status: DC

## 2021-03-27 MED ORDER — HYDROCHLOROTHIAZIDE 12.5 MG PO TABS
12.5000 mg | ORAL_TABLET | Freq: Every day | ORAL | Status: DC
Start: 2021-03-28 — End: 2021-03-28
  Administered 2021-03-28: 12.5 mg via ORAL
  Filled 2021-03-27: qty 1

## 2021-03-27 MED ORDER — ASCORBIC ACID 500 MG PO TABS
1000.0000 mg | ORAL_TABLET | Freq: Every day | ORAL | Status: DC
  Administered 2021-03-28 – 2021-03-29 (×2): 1000 mg via ORAL
  Filled 2021-03-27 (×2): qty 2

## 2021-03-27 MED ORDER — ASPIRIN EC 81 MG PO TBEC
81.0000 mg | DELAYED_RELEASE_TABLET | Freq: Every day | ORAL | Status: DC
  Administered 2021-03-28 – 2021-03-29 (×2): 81 mg via ORAL
  Filled 2021-03-27 (×2): qty 1

## 2021-03-27 MED ORDER — ASPIRIN 81 MG PO CHEW
324.0000 mg | CHEWABLE_TABLET | Freq: Once | ORAL | Status: AC
  Administered 2021-03-27: 324 mg via ORAL
  Filled 2021-03-27: qty 4

## 2021-03-27 MED ORDER — SIMVASTATIN 20 MG PO TABS
40.0000 mg | ORAL_TABLET | Freq: Every day | ORAL | Status: DC
  Administered 2021-03-27: 40 mg via ORAL
  Filled 2021-03-27: qty 2

## 2021-03-27 MED ORDER — POTASSIUM CHLORIDE CRYS ER 20 MEQ PO TBCR
20.0000 meq | EXTENDED_RELEASE_TABLET | Freq: Once | ORAL | Status: AC
  Administered 2021-03-27: 20 meq via ORAL
  Filled 2021-03-27: qty 1

## 2021-03-27 MED ORDER — ELUXADOLINE 100 MG PO TABS
100.0000 mg | ORAL_TABLET | Freq: Every day | ORAL | Status: DC
  Filled 2021-03-27: qty 1

## 2021-03-27 MED ORDER — LORATADINE 10 MG PO TABS
10.0000 mg | ORAL_TABLET | Freq: Every day | ORAL | Status: DC
  Administered 2021-03-28 – 2021-03-29 (×2): 10 mg via ORAL
  Filled 2021-03-27 (×2): qty 1

## 2021-03-27 MED ORDER — ENOXAPARIN SODIUM 40 MG/0.4ML IJ SOSY
40.0000 mg | PREFILLED_SYRINGE | INTRAMUSCULAR | Status: DC
  Administered 2021-03-27: 40 mg via SUBCUTANEOUS
  Filled 2021-03-27: qty 0.4

## 2021-03-27 MED ORDER — LISINOPRIL 20 MG PO TABS
20.0000 mg | ORAL_TABLET | Freq: Every day | ORAL | Status: DC
  Administered 2021-03-28 – 2021-03-29 (×2): 20 mg via ORAL
  Filled 2021-03-27: qty 2
  Filled 2021-03-27: qty 1

## 2021-03-27 MED ORDER — AMLODIPINE BESYLATE 5 MG PO TABS
5.0000 mg | ORAL_TABLET | Freq: Every day | ORAL | Status: DC
  Administered 2021-03-28 – 2021-03-29 (×2): 5 mg via ORAL
  Filled 2021-03-27 (×2): qty 1

## 2021-03-27 MED ORDER — ACETAMINOPHEN 325 MG PO TABS: 650.0000 mg | ORAL_TABLET | ORAL | Status: DC | PRN

## 2021-03-27 MED ORDER — NITROGLYCERIN 2 % TD OINT
0.5000 [in_us] | TOPICAL_OINTMENT | Freq: Once | TRANSDERMAL | Status: AC
  Administered 2021-03-27: 0.5 [in_us] via TOPICAL
  Filled 2021-03-27: qty 1

## 2021-03-27 NOTE — ED Triage Notes (Signed)
Pt reports intermittent chest pain and shob since Friday that has become progressively worse. Denies N/V/D, abdominal pain, and back pain.

## 2021-03-27 NOTE — H&P (Signed)
History and Physical    Jesus Cunningham [id number] DOB: [birth year] DOA: 03/27/2021  PCP: Ludwig Clarks, FNP  Patient coming from: Home  Chief Complaint: Chest pressure  HPI: Jesus Cunningham is a 70 y.o. male with medical history significant of HTN, HLD. Presenting with chest pain/pressure. His symptoms started 1 week ago. He had pressure in both his right and left chest w/o any other radiation. These episodes were brought on by activity and improved with rest. The episodes usually lasted around 10 minutes. He didn't think much of them and continued about his life. 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 He didn't have diaphoresis, palpitations, N/V. He felt a lot of pressure. He became concerned and came to the ED. He denies any other aggravating or alleviating factors.   ED Course: He was hypertensive. He was given nitro paste and his pain and BP improved. EKG did not show st elevations. Cardiology was consulted. TRH was called for admission.   Review of Systems: Review of systems is otherwise negative for all not mentioned in HPI.   PMHx Past Medical History:  Diagnosis Date   Hyperlipidemia    Hypertension     PSHx Past Surgical History:  Procedure Laterality Date   ANKLE FUSION      SocHx  reports that he has never smoked. He quit smokeless tobacco use about 39 years ago.  His smokeless tobacco use included chew. He reports current alcohol use of about 4.0 standard drinks per week. He reports that he does not use drugs.  No Known Allergies  FamHx Family History  Problem Relation Age of Onset   Stroke Mother    Heart failure Father    Diabetes Father    Hypertension Brother     Prior to Admission medications   Medication Sig Start Date End Date Taking? Authorizing Provider  amLODipine (NORVASC) 5 MG tablet Take 5 mg by mouth daily. 02/23/21  Yes [provider]  Ascorbic Acid (VITAMIN C) 1000 MG tablet Take 1,000  mg by mouth daily.   Yes [provider]  aspirin EC 81 MG tablet Take 81 mg by mouth daily.   Yes [provider]  cetirizine (ZYRTEC) 10 MG tablet Take 10 mg by mouth daily as needed for allergies.   Yes [provider]  diphenhydrAMINE (BENADRYL) 25 MG tablet Take 25 mg by mouth every 6 (six) hours as needed for allergies.   Yes [provider]  fluticasone (FLONASE) 50 MCG/ACT nasal spray Place 1 spray into both nostrils daily as needed for allergies or rhinitis.   Yes [provider]  lisinopril-hydrochlorothiazide (ZESTORETIC) 20-12.5 MG tablet Take 1 tablet by mouth daily. 02/23/21  Yes [provider]  meloxicam (MOBIC) 15 MG tablet Take 15 mg by mouth daily. 03/14/21  Yes [provider]  simvastatin (ZOCOR) 40 MG tablet Take 40 mg by mouth at bedtime. 01/31/21  Yes [provider]  VIBERZI 100 MG TABS Take 100 mg by mouth daily. 03/07/21  Yes [provider]    Physical Exam: Vitals:   03/27/21 1515 03/27/21 1523 03/27/21 1530 03/27/21 1545  BP: 111/80 128/85 116/83 120/81  Pulse: 81 76 72 73  Resp: (!) 24 13 18 18   Temp:      TempSrc:      SpO2: 95% 94% 93% 95%  Weight:      Height:        General: 70 y.o. male resting  in bed in NAD Eyes: PERRL, normal sclera ENMT: Nares patent w/o discharge, orophaynx clear, dentition normal, ears w/o discharge/lesions/ulcers Neck: Supple, trachea midline Cardiovascular: RRR, +S1, S2, no m/g/r, equal pulses throughout Respiratory: CTABL, no w/r/r, normal WOB GI: BS+, NDNT, no masses noted, no organomegaly noted MSK: No e/c/c Neuro: A&O x 3, no focal deficits Psyc: Appropriate interaction and affect, calm/cooperative  Labs on Admission: I have personally reviewed following labs and imaging studies  CBC: Recent Labs  Lab 03/27/21 1341  WBC 5.1  HGB 16.4  HCT 46.4  MCV 93.2  PLT 315   Basic Metabolic Panel: Recent Labs  Lab 03/27/21 1341  NA 135   K 3.2*  CL 100  CO2 25  GLUCOSE 141*  BUN 10  CREATININE 1.02  CALCIUM 9.4   GFR: Estimated Creatinine Clearance: 80.1 mL/min (by C-G formula based on SCr of 1.02 mg/dL). Liver Function Tests: No results for input(s): AST, ALT, ALKPHOS, BILITOT, PROT, ALBUMIN in the last 168 hours. No results for input(s): LIPASE, AMYLASE in the last 168 hours. No results for input(s): AMMONIA in the last 168 hours. Coagulation Profile: No results for input(s): INR, PROTIME in the last 168 hours. Cardiac Enzymes: No results for input(s): CKTOTAL, CKMB, CKMBINDEX, TROPONINI in the last 168 hours. BNP (last 3 results) No results for input(s): PROBNP in the last 8760 hours. HbA1C: No results for input(s): HGBA1C in the last 72 hours. CBG: No results for input(s): GLUCAP in the last 168 hours. Lipid Profile: No results for input(s): CHOL, HDL, LDLCALC, TRIG, CHOLHDL, LDLDIRECT in the last 72 hours. Thyroid Function Tests: No results for input(s): TSH, T4TOTAL, FREET4, T3FREE, THYROIDAB in the last 72 hours. Anemia Panel: No results for input(s): VITAMINB12, FOLATE, FERRITIN, TIBC, IRON, RETICCTPCT in the last 72 hours. Urine analysis: No results found for: COLORURINE, APPEARANCEUR, LABSPEC, PHURINE, GLUCOSEU, HGBUR, BILIRUBINUR, KETONESUR, PROTEINUR, UROBILINOGEN, NITRITE, LEUKOCYTESUR  Radiological Exams on Admission: DG Chest 2 View  Result Date: 03/27/2021 CLINICAL DATA:  Chest pain and shortness of breath EXAM: CHEST - 2 VIEW COMPARISON:  None. FINDINGS: The heart size and mediastinal contours are within normal limits. Large hiatal hernia. Left basilar atelectasis, lungs are otherwise clear. The visualized skeletal structures are unremarkable. IMPRESSION: 1.  Large hiatal hernia. 2.  Left basilar atelectasis, lungs otherwise clear. Electronically Signed   By: Larose Hires D.O.   On: 03/27/2021 14:02    EKG: Independently reviewed. Sinus, no st elevations  Assessment/Plan Chest  pain Elevated troponin     - place in obs, tele     - trend trp     - ASA     - echo     - cardiology consulted, appreciate assistance  HLD     - continue home statin  HTN     - continue home regimen  IBS     - continue home viberzi  DVT prophylaxis: lovenox Code Status: FULL  Family Communication: w/ wife at bedside  Consults called: EDP spoke with Cardiology  Status is: Observation  The patient remains OBS appropriate and will d/c before 2 midnights.  Teddy Spike DO [facility]  If 7PM-7AM, please contact night-coverage [email or web address]  03/27/2021, 4:02 PM

## 2021-03-27 NOTE — ED Provider Notes (Signed)
[facility]-EMERGENCY DEPT Provider Note   CSN: [id number] Arrival date & time: 03/27/21  1337     History Chief Complaint  Patient presents with   Chest Pain   Shortness of Breath    Jesus Cunningham is a 70 y.o. male.  71 year old male with prior medical history as detailed below presents for evaluation.  Patient reports exertional chest discomfort.  This has been progressive over the last 7 days.  Patient reports that with exertion he will have substernal left-sided chest pressure and discomfort.  With rest his symptoms resolved.  At worst, his discomfort lasted approximately 15 minutes.  He denies associated diaphoresis or nausea.  He does feel winded with exertion as well.  Currently, at rest he is without complaint of pain or dyspnea.  He denies prior history of cardiac issues.  Patient does have established history of hypertension and hyperlipidemia.  Patient does have ED.  He denies any recent use of Viagra within the last 4 to 5 days.    The history is provided by the patient.  Chest Pain Pain location:  L chest and substernal area Pain quality: aching and pressure   Pain radiates to:  Does not radiate Pain severity:  No pain Onset quality:  Gradual Duration:  7 days Timing:  Intermittent Progression:  Waxing and waning Chronicity:  New Associated symptoms: shortness of breath   Shortness of Breath Associated symptoms: chest pain       Past Medical History:  Diagnosis Date   Hyperlipidemia    Hypertension     Patient Active Problem List   Diagnosis Date Noted   Neutropenia (HCC) 10/11/2016    Past Surgical History:  Procedure Laterality Date   ANKLE FUSION         Family History  Problem Relation Age of Onset   Stroke Mother    Heart failure Father    Diabetes Father    Hypertension Brother     Social History   Tobacco Use   Smoking status: Never   Smokeless tobacco: Former    Types: Chew    Quit date: 10/11/1981   Vaping Use   Vaping Use: Never used  Substance Use Topics   Alcohol use: Yes    Alcohol/week: 4.0 standard drinks    Types: 4 Glasses of wine per week   Drug use: No    Home Medications Prior to Admission medications   Medication Sig Start Date End Date Taking? Authorizing Provider  amLODipine (NORVASC) 5 MG tablet Take 5 mg by mouth daily. 02/23/21  Yes [provider]  Ascorbic Acid (VITAMIN C) 1000 MG tablet Take 1,000 mg by mouth daily.   Yes [provider]  aspirin EC 81 MG tablet Take 81 mg by mouth daily.   Yes [provider]  cetirizine (ZYRTEC) 10 MG tablet Take 10 mg by mouth daily as needed for allergies.   Yes [provider]  diphenhydrAMINE (BENADRYL) 25 MG tablet Take 25 mg by mouth every 6 (six) hours as needed for allergies.   Yes [provider]  fluticasone (FLONASE) 50 MCG/ACT nasal spray Place 1 spray into both nostrils daily as needed for allergies or rhinitis.   Yes [provider]  lisinopril-hydrochlorothiazide (ZESTORETIC) 20-12.5 MG tablet Take 1 tablet by mouth daily. 02/23/21  Yes [provider]  meloxicam (MOBIC) 15 MG tablet Take 15 mg by mouth daily. 03/14/21  Yes [provider]  simvastatin (ZOCOR) 40 MG tablet Take 40 mg  by mouth at bedtime. 01/31/21  Yes [provider]  VIBERZI 100 MG TABS Take 100 mg by mouth daily. 03/07/21  Yes [provider]    Allergies    Patient has no known allergies.  Review of Systems   Review of Systems  Respiratory:  Positive for shortness of breath.   Cardiovascular:  Positive for chest pain.  All other systems reviewed and are negative.  Physical Exam Updated Vital Signs BP 111/80   Pulse 81   Temp 98.1 F (36.7 C) (Oral)   Resp (!) 24   Ht 6' (1.829 m)   Wt 93.7 kg   SpO2 95%   BMI 28.02 kg/m   Physical Exam Vitals and nursing note reviewed.  Constitutional:      General: He is not in acute distress.     Appearance: Normal appearance. He is well-developed.  HENT:     Head: Normocephalic and atraumatic.  Eyes:     Conjunctiva/sclera: Conjunctivae normal.     Pupils: Pupils are equal, round, and reactive to light.  Cardiovascular:     Rate and Rhythm: Normal rate and regular rhythm.     Heart sounds: Normal heart sounds.  Pulmonary:     Effort: Pulmonary effort is normal. No respiratory distress.     Breath sounds: Normal breath sounds.  Abdominal:     General: There is no distension.     Palpations: Abdomen is soft.     Tenderness: There is no abdominal tenderness.  Musculoskeletal:        General: No deformity. Normal range of motion.     Cervical back: Normal range of motion and neck supple.  Skin:    General: Skin is warm and dry.  Neurological:     General: No focal deficit present.     Mental Status: He is alert and oriented to person, place, and time.    ED Results / Procedures / Treatments   Labs (all labs ordered are listed, but only abnormal results are displayed) Labs Reviewed  BASIC METABOLIC PANEL - Abnormal; Notable for the following components:      Result Value   Potassium 3.2 (*)    Glucose, Bld 141 (*)    All other components within normal limits  TROPONIN I (HIGH SENSITIVITY) - Abnormal; Notable for the following components:   Troponin I (High Sensitivity) 33 (*)    All other components within normal limits  RESP PANEL BY RT-PCR (FLU A&B, COVID) [id number]  CBC  TROPONIN I (HIGH SENSITIVITY)    EKG EKG Interpretation  Date/Time:  Sunday March 27 2021 14:23:56 EST Ventricular Rate:  76 PR Interval:  159 QRS Duration: 91 QT Interval:  434 QTC Calculation: 488 R Axis:   -12 Text Interpretation: Sinus rhythm Atrial premature complexes Borderline ST depression, lateral leads Borderline prolonged QT interval Confirmed by Kristine Royal [id number]) on 03/27/2021 2:32:41 PM  Radiology DG Chest 2 View  Result Date: 03/27/2021 CLINICAL DATA:  Chest pain  and shortness of breath EXAM: CHEST - 2 VIEW COMPARISON:  None. FINDINGS: The heart size and mediastinal contours are within normal limits. Large hiatal hernia. Left basilar atelectasis, lungs are otherwise clear. The visualized skeletal structures are unremarkable. IMPRESSION: 1.  Large hiatal hernia. 2.  Left basilar atelectasis, lungs otherwise clear. Electronically Signed   By: Larose Hires D.O.   On: 03/27/2021 14:02    Procedures Procedures   Medications Ordered in ED Medications  aspirin chewable tablet 324 mg (324 mg Oral  Given 03/27/21 1410)  nitroGLYCERIN (NITROGLYN) 2 % ointment 0.5 inch (0.5 inches Topical Given 03/27/21 1410)    ED Course  I have reviewed the triage vital signs and the nursing notes.  Pertinent labs & imaging results that were available during my care of the patient were reviewed by me and considered in my medical decision making (see chart for details).    MDM Rules/Calculators/A&P                           MDM  MSE complete  CRUZITO STANDRE was evaluated in Emergency Department on 03/27/2021 for the symptoms described in the history of present illness. He was evaluated in the context of the global COVID-19 pandemic, which necessitated consideration that the patient might be at risk for infection with the SARS-CoV-2 virus that causes COVID-19. Institutional protocols and algorithms that pertain to the evaluation of patients at risk for COVID-19 are in a state of rapid change based on information released by regulatory bodies including the CDC and federal and state organizations. These policies and algorithms were followed during the patient's care in the ED.  Patient is presenting with complaint of chest discomfort associated with exertion.  At rest he is pain-free.  Initial EKG is concerning for possible ischemia.  No evidence of ST elevation MI.  Patient was noted to be hypertensive on arrival with a systolic into the 170s. Improved at time of  admission.  Patient is pain free at time of admission.  Patient without prior cardiac work-up or evaluation  Initial troponin is 33.  Aspirin given on arrival.  Case discussed with cardiology and they will consult.    Tentative plan for stress testing tomorrow.  Patient would benefit from admission for further work-up and treatment.  Hospitalist service is aware of case.   Final Clinical Impression(s) / ED Diagnoses Final diagnoses:  Chest pain, unspecified type    Rx / DC Orders ED Discharge Orders     None        Wynetta Fines, MD 03/27/21 1528

## 2021-03-28 ENCOUNTER — Observation Stay (HOSPITAL_COMMUNITY): Payer: BC Managed Care – PPO

## 2021-03-28 ENCOUNTER — Inpatient Hospital Stay (HOSPITAL_COMMUNITY): Payer: BC Managed Care – PPO

## 2021-03-28 ENCOUNTER — Encounter (HOSPITAL_COMMUNITY)
Admission: EM | Disposition: A | Discharge status: Home / Self Care | Payer: Self-pay | Source: Home / Self Care | Attending: Thoracic Surgery (Cardiothoracic Vascular Surgery)

## 2021-03-28 DIAGNOSIS — K449 Diaphragmatic hernia without obstruction or gangrene: Secondary | ICD-10-CM | POA: Diagnosis present

## 2021-03-28 DIAGNOSIS — R079 Chest pain, unspecified: Secondary | ICD-10-CM | POA: Diagnosis present

## 2021-03-28 DIAGNOSIS — J9601 Acute respiratory failure with hypoxia: Secondary | ICD-10-CM | POA: Diagnosis not present

## 2021-03-28 DIAGNOSIS — I214 Non-ST elevation (NSTEMI) myocardial infarction: Secondary | ICD-10-CM | POA: Diagnosis present

## 2021-03-28 DIAGNOSIS — Z951 Presence of aortocoronary bypass graft: Secondary | ICD-10-CM | POA: Diagnosis not present

## 2021-03-28 DIAGNOSIS — I4891 Unspecified atrial fibrillation: Secondary | ICD-10-CM | POA: Diagnosis present

## 2021-03-28 DIAGNOSIS — Z01810 Encounter for preprocedural cardiovascular examination: Secondary | ICD-10-CM

## 2021-03-28 DIAGNOSIS — I10 Essential (primary) hypertension: Secondary | ICD-10-CM | POA: Diagnosis present

## 2021-03-28 DIAGNOSIS — I2510 Atherosclerotic heart disease of native coronary artery without angina pectoris: Secondary | ICD-10-CM

## 2021-03-28 DIAGNOSIS — I25110 Atherosclerotic heart disease of native coronary artery with unstable angina pectoris: Secondary | ICD-10-CM | POA: Diagnosis present

## 2021-03-28 DIAGNOSIS — E876 Hypokalemia: Secondary | ICD-10-CM | POA: Diagnosis present

## 2021-03-28 DIAGNOSIS — K529 Noninfective gastroenteritis and colitis, unspecified: Secondary | ICD-10-CM | POA: Diagnosis not present

## 2021-03-28 DIAGNOSIS — E871 Hypo-osmolality and hyponatremia: Secondary | ICD-10-CM | POA: Diagnosis not present

## 2021-03-28 DIAGNOSIS — Z79899 Other long term (current) drug therapy: Secondary | ICD-10-CM | POA: Diagnosis not present

## 2021-03-28 DIAGNOSIS — E785 Hyperlipidemia, unspecified: Secondary | ICD-10-CM | POA: Diagnosis present

## 2021-03-28 DIAGNOSIS — Z7982 Long term (current) use of aspirin: Secondary | ICD-10-CM | POA: Diagnosis not present

## 2021-03-28 DIAGNOSIS — E8770 Fluid overload, unspecified: Secondary | ICD-10-CM | POA: Diagnosis present

## 2021-03-28 DIAGNOSIS — I9789 Other postprocedural complications and disorders of the circulatory system, not elsewhere classified: Secondary | ICD-10-CM | POA: Diagnosis not present

## 2021-03-28 DIAGNOSIS — D62 Acute posthemorrhagic anemia: Secondary | ICD-10-CM | POA: Diagnosis not present

## 2021-03-28 DIAGNOSIS — K589 Irritable bowel syndrome without diarrhea: Secondary | ICD-10-CM | POA: Diagnosis present

## 2021-03-28 DIAGNOSIS — Z8249 Family history of ischemic heart disease and other diseases of the circulatory system: Secondary | ICD-10-CM | POA: Diagnosis not present

## 2021-03-28 DIAGNOSIS — Z20822 Contact with and (suspected) exposure to covid-19: Secondary | ICD-10-CM | POA: Diagnosis present

## 2021-03-28 DIAGNOSIS — Z87891 Personal history of nicotine dependence: Secondary | ICD-10-CM | POA: Diagnosis not present

## 2021-03-28 DIAGNOSIS — Z791 Long term (current) use of non-steroidal anti-inflammatories (NSAID): Secondary | ICD-10-CM | POA: Diagnosis not present

## 2021-03-28 HISTORY — PX: LEFT HEART CATH AND CORONARY ANGIOGRAPHY: CATH118249

## 2021-03-28 LAB — ECHOCARDIOGRAM COMPLETE
Area-P 1/2: 2.76 cm2
Height: 72 in
S' Lateral: 3 cm
Weight: 3305.14 oz

## 2021-03-28 LAB — HIV ANTIBODY (ROUTINE TESTING W REFLEX): HIV Screen 4th Generation wRfx: NONREACTIVE

## 2021-03-28 LAB — LIPID PANEL
Cholesterol: 169 mg/dL (ref 0–200)
HDL: 80 mg/dL (ref >40)
LDL Cholesterol: 77 mg/dL (ref 0–99)
Total CHOL/HDL Ratio: 2.1 RATIO
Triglycerides: 59 mg/dL (ref <150)
VLDL: 12 mg/dL (ref 0–40)

## 2021-03-28 SURGERY — LEFT HEART CATH AND CORONARY ANGIOGRAPHY
Anesthesia: LOCAL

## 2021-03-28 MED ORDER — SODIUM CHLORIDE 0.9% FLUSH
3.0000 mL | Freq: Two times a day (BID) | INTRAVENOUS | Status: DC
  Administered 2021-03-28 – 2021-03-29 (×2): 3 mL via INTRAVENOUS

## 2021-03-28 MED ORDER — SODIUM CHLORIDE 0.9 % IV SOLN: 250.0000 mL | INTRAVENOUS | Status: DC | PRN

## 2021-03-28 MED ORDER — ATORVASTATIN CALCIUM 40 MG PO TABS: 40.0000 mg | ORAL_TABLET | Freq: Every day | ORAL | Status: DC

## 2021-03-28 MED ORDER — FENTANYL CITRATE (PF) 100 MCG/2ML IJ SOLN
INTRAMUSCULAR | Status: DC | PRN
  Administered 2021-03-28: 50 ug via INTRAVENOUS

## 2021-03-28 MED ORDER — MUPIROCIN 2 % EX OINT
1.0000 "application " | TOPICAL_OINTMENT | Freq: Two times a day (BID) | CUTANEOUS | Status: DC
  Administered 2021-03-29 (×2): 1 via NASAL
  Filled 2021-03-28: qty 22

## 2021-03-28 MED ORDER — HEPARIN (PORCINE) IN NACL 1000-0.9 UT/500ML-% IV SOLN
INTRAVENOUS | Status: AC
  Filled 2021-03-28: qty 1000

## 2021-03-28 MED ORDER — HEPARIN (PORCINE) 25000 UT/250ML-% IV SOLN
1400.0000 [IU]/h | INTRAVENOUS | Status: DC
  Administered 2021-03-28: 1300 [IU]/h via INTRAVENOUS
  Administered 2021-03-29: 1400 [IU]/h via INTRAVENOUS
  Filled 2021-03-28 (×2): qty 250

## 2021-03-28 MED ORDER — HEPARIN SODIUM (PORCINE) 1000 UNIT/ML IJ SOLN
INTRAMUSCULAR | Status: AC
  Filled 2021-03-28: qty 10

## 2021-03-28 MED ORDER — HEPARIN BOLUS VIA INFUSION
4000.0000 [IU] | Freq: Once | INTRAVENOUS | Status: AC
  Administered 2021-03-28: 4000 [IU] via INTRAVENOUS
  Filled 2021-03-28: qty 4000

## 2021-03-28 MED ORDER — MIDAZOLAM HCL 2 MG/2ML IJ SOLN
INTRAMUSCULAR | Status: DC | PRN
  Administered 2021-03-28: 2 mg via INTRAVENOUS

## 2021-03-28 MED ORDER — HYDRALAZINE HCL 20 MG/ML IJ SOLN: 10.0000 mg | INTRAMUSCULAR | Status: AC | PRN

## 2021-03-28 MED ORDER — VERAPAMIL HCL 2.5 MG/ML IV SOLN
INTRAVENOUS | Status: DC | PRN
  Administered 2021-03-28: 10 mL via INTRA_ARTERIAL

## 2021-03-28 MED ORDER — ATORVASTATIN CALCIUM 80 MG PO TABS
80.0000 mg | ORAL_TABLET | Freq: Every day | ORAL | Status: DC
  Administered 2021-03-29 – 2021-04-03 (×5): 80 mg via ORAL
  Filled 2021-03-28 (×5): qty 1

## 2021-03-28 MED ORDER — SODIUM CHLORIDE 0.9% FLUSH: 3.0000 mL | INTRAVENOUS | Status: DC | PRN

## 2021-03-28 MED ORDER — LIDOCAINE HCL (PF) 1 % IJ SOLN
INTRAMUSCULAR | Status: DC | PRN
  Administered 2021-03-28: 5 mL

## 2021-03-28 MED ORDER — FENTANYL CITRATE (PF) 100 MCG/2ML IJ SOLN
INTRAMUSCULAR | Status: AC
  Filled 2021-03-28: qty 2

## 2021-03-28 MED ORDER — LABETALOL HCL 5 MG/ML IV SOLN: 10.0000 mg | INTRAVENOUS | Status: AC | PRN

## 2021-03-28 MED ORDER — SODIUM CHLORIDE 0.9 % IV SOLN: INTRAVENOUS | Status: AC

## 2021-03-28 MED ORDER — MIDAZOLAM HCL 2 MG/2ML IJ SOLN
INTRAMUSCULAR | Status: AC
  Filled 2021-03-28: qty 2

## 2021-03-28 MED ORDER — VERAPAMIL HCL 2.5 MG/ML IV SOLN
INTRAVENOUS | Status: AC
  Filled 2021-03-28: qty 2

## 2021-03-28 MED ORDER — HEPARIN (PORCINE) 25000 UT/250ML-% IV SOLN
1200.0000 [IU]/h | INTRAVENOUS | Status: DC
Start: 2021-03-28 — End: 2021-03-28
  Administered 2021-03-28: 1200 [IU]/h via INTRAVENOUS
  Filled 2021-03-28: qty 250

## 2021-03-28 MED ORDER — HEPARIN (PORCINE) IN NACL 1000-0.9 UT/500ML-% IV SOLN
INTRAVENOUS | Status: DC | PRN
  Administered 2021-03-28 (×2): 500 mL

## 2021-03-28 MED ORDER — HEPARIN SODIUM (PORCINE) 1000 UNIT/ML IJ SOLN
INTRAMUSCULAR | Status: DC | PRN
  Administered 2021-03-28: 5000 [IU] via INTRAVENOUS

## 2021-03-28 MED ORDER — HEPARIN (PORCINE) 25000 UT/250ML-% IV SOLN: 1300.0000 [IU]/h | INTRAVENOUS | Status: DC

## 2021-03-28 MED ORDER — IOHEXOL 350 MG/ML SOLN
INTRAVENOUS | Status: DC | PRN
  Administered 2021-03-28: 65 mL via INTRACARDIAC

## 2021-03-28 MED ORDER — LIDOCAINE HCL (PF) 1 % IJ SOLN
INTRAMUSCULAR | Status: AC
  Filled 2021-03-28: qty 30

## 2021-03-28 SURGICAL SUPPLY — 12 items
CATH 5FR JL3.5 JR4 ANG PIG MP (CATHETERS) ×1 IMPLANT
DEVICE RAD COMP TR BAND LRG (VASCULAR PRODUCTS) ×1 IMPLANT
GLIDESHEATH SLEND SS 6F .021 (SHEATH) ×1 IMPLANT
GUIDEWIRE INQWIRE 1.5J.035X260 (WIRE) IMPLANT
INQWIRE 1.5J .035X260CM (WIRE) ×2
KIT HEART LEFT (KITS) ×2 IMPLANT
PACK CARDIAC CATHETERIZATION (CUSTOM PROCEDURE TRAY) ×2 IMPLANT
SHEATH 6FR 85 DEST SLENDER (SHEATH) ×1 IMPLANT
SYR MEDRAD MARK 7 150ML (SYRINGE) ×2 IMPLANT
TRANSDUCER W/STOPCOCK (MISCELLANEOUS) ×2 IMPLANT
TUBING CIL FLEX 10 FLL-RA (TUBING) ×2 IMPLANT
WIRE HI TORQ VERSACORE-J 145CM (WIRE) ×1 IMPLANT

## 2021-03-28 NOTE — Consult Note (Addendum)
Cardiology Consultation:   Patient ID: Jesus Cunningham MRN: [id number]; DOB: 02/22/1951  Admit date: 03/27/2021 Date of Consult: 03/28/2021  PCP:  Adams, Erica W, FNP   [facility] Providers Cardiologist:  None  new  Patient Profile:   Jesus Cunningham is a 70 y.o. male with a hx of HTN, HLD, who is being seen 03/28/2021 for the evaluation of chest pain, elevated troponin, at the request of Dr Memon.  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  He also goes to the gym a couple of times a week.  Over the last couple of weeks, he would notice some chest tightness whenever he started to do something that was at the upper level of his normal exertion.  The symptoms were always relieved by rest and never lasted very long.  Yesterday, he was lifting some feed bags that were pretty heavy.  The chest pain started again, but was more severe and did not go away quickly.  It reached a 5 or 6 out of 10.  It made him feel little short of breath.  There was no nausea, vomiting or diaphoresis.  It was the same pain he had been having, but was increased in severity and duration.  He knew something was wrong and came to the hospital.  His symptoms have resolved with rest, and have not returned since he was admitted.  Currently he is resting comfortably.  When options were discussed with the patient, he prefers a definitive evaluation.   Past Medical History:  Diagnosis Date   Hyperlipidemia    Hypertension     Past Surgical History:  Procedure Laterality Date   ANKLE FUSION       Home Medications:  Prior to Admission medications   Medication Sig Start Date End Date Taking? Authorizing Provider  amLODipine (NORVASC) 5 MG tablet Take 5 mg by mouth daily. 02/23/21  Yes [provider]  Ascorbic Acid (VITAMIN C) 1000 MG tablet Take 1,000 mg by mouth daily.   Yes [provider]  aspirin EC 81 MG tablet Take 81 mg by  mouth daily.   Yes [provider]  cetirizine (ZYRTEC) 10 MG tablet Take 10 mg by mouth daily as needed for allergies.   Yes [provider]  diphenhydrAMINE (BENADRYL) 25 MG tablet Take 25 mg by mouth every 6 (six) hours as needed for allergies.   Yes [provider]  fluticasone (FLONASE) 50 MCG/ACT nasal spray Place 1 spray into both nostrils daily as needed for allergies or rhinitis.   Yes [provider]  lisinopril-hydrochlorothiazide (ZESTORETIC) 20-12.5 MG tablet Take 1 tablet by mouth daily. 02/23/21  Yes [provider]  meloxicam (MOBIC) 15 MG tablet Take 15 mg by mouth daily. 03/14/21  Yes [provider]  simvastatin (ZOCOR) 40 MG tablet Take 40 mg by mouth at bedtime. 01/31/21  Yes [provider]  VIBERZI 100 MG TABS Take 100 mg by mouth daily. 03/07/21  Yes [provider]    Inpatient Medications: Scheduled Meds:  amLODipine  5 mg Oral Daily   vitamin C  1,000 mg Oral Daily   aspirin EC  81 mg Oral Daily   Eluxadoline  100 mg Oral Daily   enoxaparin (LOVENOX) injection  40 mg Subcutaneous Q24H   lisinopril  20 mg Oral Daily   And   hydrochlorothiazide  12.5 mg Oral Daily   loratadine  10 mg Oral Daily     simvastatin  40 mg Oral QHS   Continuous Infusions:  PRN Meds: acetaminophen, ondansetron (ZOFRAN) IV  Allergies:   No Known Allergies  Social History:   Social History   Socioeconomic History   Marital status: Married    Spouse name: Not on file   Number of children: Not on file   Years of education: Not on file   Highest education level: Not on file  Occupational History   Not on file  Tobacco Use   Smoking status: Never   Smokeless tobacco: Former    Types: Chew    Quit date: 10/11/1981  Vaping Use   Vaping Use: Never used  Substance and Sexual Activity   Alcohol use: Yes    Alcohol/week: 4.0 standard drinks    Types: 4 Glasses of wine per week   Drug use: No   Sexual  activity: Not on file  Other Topics Concern   Not on file  Social History Narrative   Not on file   Social Determinants of Health   Financial Resource Strain: Not on file  Food Insecurity: Not on file  Transportation Needs: Not on file  Physical Activity: Not on file  Stress: Not on file  Social Connections: Not on file  Intimate Partner Violence: Not on file    Family History:   Family History  Problem Relation Age of Onset   Stroke Mother    Heart failure Father    Diabetes Father    Hypertension Brother      ROS:  Please see the history of present illness.  All other ROS reviewed and negative.     Physical Exam/Data:   Vitals:   03/28/21 0330 03/28/21 0430 03/28/21 0500 03/28/21 0600  BP: 131/81 120/74 120/78 131/88  Pulse: (!) 51 (!) 50 (!) 52 (!) 55  Resp: 15 18 19 17  Temp:      TempSrc:      SpO2: 94% 92% 92% 94%  Weight:      Height:        Intake/Output Summary (Last 24 hours) at 03/28/2021 0912 Last data filed at 03/27/2021 2021 Gross per 24 hour  Intake --  Output 600 ml  Net -600 ml   Last 3 Weights 03/27/2021 05/01/2017 10/25/2016  Weight (lbs) 206 lb 9.1 oz 206 lb 9.6 oz 210 lb  Weight (kg) 93.7 kg 93.713 kg 95.255 kg     Body mass index is 28.02 kg/m.  General:  Well nourished, well developed, in no acute distress HEENT: normal for age Neck: no JVD Vascular: No carotid bruits; Distal pulses 2+ bilaterally Cardiac:  normal S1, S2; RRR; no murmur  Lungs:  clear to auscultation bilaterally, no wheezing, rhonchi or rales  Abd: soft, nontender, no hepatomegaly  Ext: no edema Musculoskeletal:  No deformities, BUE and BLE strength normal and equal Skin: warm and dry  Neuro:  CNs 2-12 intact, no focal abnormalities noted Psych:  Normal affect   EKG:  The EKG was personally reviewed and demonstrates:  03/27/2021 @ 13:42, SR, HR 84, diffuse ST depression, improved on subsequent ECG Telemetry:  Telemetry was personally reviewed and demonstrates:  Sinus rhythm, sinus bradycardia to the high 40s  Relevant CV Studies:  ECHO: ordered  Laboratory Data:  High Sensitivity Troponin:   Recent Labs  Lab 03/27/21 1341 03/27/21 1530 03/27/21 1807 03/27/21 2003  TROPONINIHS 33* 48* 73* 56*     Chemistry Recent Labs  Lab 03/27/21 1341 03/27/21 1808  NA 135  --     K 3.2*  --   CL 100  --   CO2 25  --   GLUCOSE 141*  --   BUN 10  --   CREATININE 1.02 0.81  CALCIUM 9.4  --   GFRNONAA >60 >60  ANIONGAP 10  --     No results for input(s): PROT, ALBUMIN, AST, ALT, ALKPHOS, BILITOT in the last 168 hours. Lipids  Recent Labs  Lab 03/28/21 0230  CHOL 169  TRIG 59  HDL 80  LDLCALC 77  CHOLHDL 2.1    Hematology Recent Labs  Lab 03/27/21 1341 03/27/21 1808  WBC 5.1 4.5  RBC 4.98 4.54  HGB 16.4 15.0  HCT 46.4 42.5  MCV 93.2 93.6  MCH 32.9 33.0  MCHC 35.3 35.3  RDW 12.6 12.7  PLT 315 282   Thyroid No results for input(s): TSH, FREET4 in the last 168 hours.  BNPNo results for input(s): BNP, PROBNP in the last 168 hours.  DDimer No results for input(s): DDIMER in the last 168 hours.   Radiology/Studies:  DG Chest 2 View  Result Date: 03/27/2021 CLINICAL DATA:  Chest pain and shortness of breath EXAM: CHEST - 2 VIEW COMPARISON:  None. FINDINGS: The heart size and mediastinal contours are within normal limits. Large hiatal hernia. Left basilar atelectasis, lungs are otherwise clear. The visualized skeletal structures are unremarkable. IMPRESSION: 1.  Large hiatal hernia. 2.  Left basilar atelectasis, lungs otherwise clear. Electronically Signed   By: Larose Hires D.O.   On: 03/27/2021 14:02     Assessment and Plan:   Non-STEMI -He was having consistent exertional chest pain, that increased with increased activity - Cardiac enzymes are abnormal - His initial ECG shows some diffuse ST depression that improved on subsequent ECG - Cardiac catheterization was discussed with the patient fully. The patient understands  that risks include but are not limited to stroke (1 in 1000), death (1 in 1000), kidney failure [usually temporary] (1 in 500), bleeding (1 in 200), allergic reaction [possibly serious] (1 in 200).  The patient understands and is willing to proceed.    2.  Hypertension -Hold HCTZ for, continue lisinopril -Avoid beta-blocker with resting bradycardia  3.  Hyperlipidemia: - LDL 77 on Zocor 40 mg daily - Recommend changing to Lipitor 40 mg daily   Risk Assessment/Risk Scores:     TIMI Risk Score for Unstable Angina or Non-ST Elevation MI:   The patient's TIMI risk score is 5, which indicates a 26% risk of all cause mortality, new or recurrent myocardial infarction or need for urgent revascularization in the next 14 days.   For questions or updates, please contact [facility] Please consult [email or web address] for contact info under    Signed, Theodore Demark, PA-C  03/28/2021 9:12 AM  Patient seen and examined.  Agree with above documentation.  Jesus Cunningham is a 70 year old male with history of hypertension, hyperlipidemia who we are consulted by Dr. Kerry Hough for evaluation of chest pain.  He reports that over the last few weeks he has begun having exertional chest pain.  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  Typically would only last for about 2 minutes.  However yesterday reports that he was lifting some bags of feed and started having chest pain that was similar to what he had been having but more intense and lasted longer, about 8 to 10 minutes.  Given the symptoms he came to the ED.  On presentation to the ED, initial vital signs notable for BP 165/88, SPO2 97%, pulse 85.  Labs notable for potassium 3.2, creatinine 1.0, WBC 5.1, hemoglobin 16.4, platelets 315, COVID-19 negative, LDL 77, troponin 33 > 48 > 73 > 56.  Chest x-ray showed large hiatal hernia, left basilar atelectasis.  EKG shows sinus rhythm, rate 84, PVC, ST  elevation in aVR with diffuse ST depressions, improved on repeat EKG.  Presentation consistent with an acute coronary syndrome, as he describes worsening exertional chest pain over the last few weeks and presents with mild troponin elevation and ischemic changes on EKG.  Currently chest pain-free.  Recommend cardiac catheterization for further evaluation.  Start heparin drip.  We will switch simvastatin to atorvastatin 40 mg daily.  Risks and benefits of cardiac catheterization have been discussed with the patient.  These include bleeding, infection, kidney damage, stroke, heart attack, death.  The patient understands these risks and is willing to proceed.  Buddie Marston L Hayley Horn, MD  

## 2021-03-28 NOTE — Progress Notes (Signed)
ANTICOAGULATION CONSULT NOTE   Pharmacy Consult for Heparin Indication: chest pain/ACS  No Known Allergies  Patient Measurements: Height: 6' (182.9 cm) Weight: 93.7 kg (206 lb 9.1 oz) IBW/kg (Calculated) : 77.6 Heparin Dosing Weight: 93.7 kg  Vital Signs: BP: 132/88 (12/12 0914) Pulse Rate: 54 (12/12 0900)  Labs: Recent Labs    03/27/21 1341 03/27/21 1530 03/27/21 1807 03/27/21 2003  HGB 16.4  --   --   --   HCT 46.4  --   --   --   PLT 315  --   --   --   CREATININE 1.02  --   --   --   TROPONINIHS 33* 48* 73* 56*   Estimated Creatinine Clearance: 80.1 mL/min (by C-G formula based on SCr of 1.02 mg/dL).  Medical History: Past Medical History:  Diagnosis Date   Hyperlipidemia    Hypertension    Medications:  Scheduled:   amLODipine  5 mg Oral Daily   vitamin C  1,000 mg Oral Daily   aspirin EC  81 mg Oral Daily   Eluxadoline  100 mg Oral Daily   lisinopril  20 mg Oral Daily   loratadine  10 mg Oral Daily   simvastatin  40 mg Oral QHS   Infusions:   heparin 1,200 Units/hr (03/28/21 1013)    Assessment: 70 yoM to ED with chest pain, elevated troponins, intermittent chest pain over past 2 wk. Cards eval: NSTEMI, plan cath, begin Heparin infusion. Baseline CBC wnl, no anti-coagulation PTA.  Goal of Therapy:  Heparin level 0.3-0.7 units/ml Monitor platelets by anticoagulation protocol: Yes   Plan:  Heparin bolus 4000 units, infusion rate at 1200 units/hr Check 6 hr Heparin level, plan daily Heparin level at steady state Daily CBC  Chilton Si, Brand Males PharmD 03/28/2021,9:45 AM

## 2021-03-28 NOTE — Consult Note (Addendum)
[address]       [place] [address]             [phone]        Jesus Cunningham [place] Medical Record [id number] Date of Birth: 10-15-1950  Referring: Burnell Blanks, MD Primary Care: Ludwig Clarks, FNP Primary Cardiologist:None  Chief Complaint:    Chief Complaint  Patient presents with   Chest Pain   Shortness of Breath    History of Present Illness:     Jesus Cunningham is seen Tiet is a very pleasant 70 year old gentleman with no prior cardiac history but does have a history of hypertension, hyperlipidemia, and irritable bowel syndrome.   He formerly used chewing tobacco but has not done so since 1983.  He presented to the emergency room yesterday given history of exertional chest pressure occurring over the previous 10 days.  He described heavy pressure in both his right and left chest that was brought on with activities like stacking firewood or walking up a hill.  But would resolve with rest with an a couple of minutes.  He had a more intense episode of pain yesterday while moving some feed bags and presented to the emergency room.  EKG showed diffuse ST segment depressions.  Serial high-sensitivity troponins measured at [phone].  He was found to be hypertensive in the emergency room and was treated with nitroglycerin with resolution of both his high blood pressure and chest pain.  Chest x-ray showed clear lung fields with some mild left lower lung zone atelectasis.  There is evidence of a large hiatal hernia. Jesus Cunningham was admitted to the hospital for observation.  The cardiology team was consulted and the patient was seen by Dr. Gardiner Rhyme.  He was started on aspirin, lisinopril, and Lipitor.  He was not given a beta-blocker due to sinus bradycardia observed in the ED.  Left heart catheterization was recommended and carried out earlier today.  Study demonstrates a 99% distal left main stenosis with extension into the ostia of both the  LAD and circumflex coronary arteries.  Additionally, there is a long segment 40% stenosis of the mid right coronary artery and a 99% distal right coronary stenosis just before the bifurcation of the PDA and PL.  A ventriculogram was not performed but the ventricle appears vigorous on the angiography images.  An echocardiogram has been performed earlier today but the report is not yet available.    We have been asked to evaluate Jesus Cunningham for consideration of urgent coronary bypass grafting.  Jesus Cunningham is accompanied by his wife for 41 years.  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  He considers himself to have a healthy lifestyle with good eating habits and regular exercise.  He has not had any other medical problems other than mild hypertension and dyslipidemia.  His only previous surgery was a left ankle fusion done 12 years ago with good result.  He has regular dental visits and denies having any active dental problems at this time.  Current Activity/ Functional Status: Patient is independent with mobility/ambulation, transfers, ADL's, IADL's.   Zubrod Score: At the time of surgery this patient's most appropriate activity status/level should be described as: []     0    Normal activity, no symptoms []     1    Restricted in physical strenuous activity but ambulatory, able to do out light  work []     2    Ambulatory and capable of self care, unable to do work activities, up and about                 more than 50%  Of the time                            []     3    Only limited self care, in bed greater than 50% of waking hours []     4    Completely disabled, no self care, confined to bed or chair []     5    Moribund  Past Medical History:  Diagnosis Date   Hyperlipidemia    Hypertension     Past Surgical History:  Procedure Laterality Date   ANKLE FUSION      Social History   Tobacco Use  Smoking Status Never  Smokeless  Tobacco Former   Types: Chew   Quit date: 10/11/1981    Social History   Substance and Sexual Activity  Alcohol Use Yes   Alcohol/week: 4.0 standard drinks   Types: 4 Glasses of wine per week     No Known Allergies  Current Facility-Administered Medications  Medication Dose Route Frequency Provider Last Rate Last Admin   0.9 %  sodium chloride infusion   Intravenous Continuous Burnell Blanks, MD       [MAR Hold] acetaminophen (TYLENOL) tablet 650 mg  650 mg Oral Q4H PRN Marylyn Ishihara, Tyrone A, DO       [MAR Hold] amLODipine (NORVASC) tablet 5 mg  5 mg Oral Daily Kyle, Tyrone A, DO   5 mg at 03/28/21 0914   [MAR Hold] ascorbic acid (VITAMIN C) tablet 1,000 mg  1,000 mg Oral Daily Kyle, Tyrone A, DO   1,000 mg at 03/28/21 0909   [MAR Hold] aspirin EC tablet 81 mg  81 mg Oral Daily Kyle, Tyrone A, DO   81 mg at 03/28/21 0910   [MAR Hold] atorvastatin (LIPITOR) tablet 40 mg  40 mg Oral Daily Donato Heinz, MD       [MAR Hold] Eluxadoline TABS 100 mg  100 mg Oral Daily Kyle, Tyrone A, DO       fentaNYL (SUBLIMAZE) injection    PRN Burnell Blanks, MD   50 mcg at 03/28/21 1312   Heparin (Porcine) in NaCl 1000-0.9 UT/500ML-% SOLN    PRN Lauree Chandler D, MD   500 mL at 03/28/21 1311   heparin ADULT infusion 100 units/mL (25000 units/2104mL)  1,200 Units/hr Intravenous Continuous Green, Terri L, RPH 0.5 mL/hr at 03/28/21 1202 12 Units/hr at 03/28/21 1202   heparin sodium (porcine) injection    PRN Burnell Blanks, MD   5,000 Units at 03/28/21 1338   iohexol (OMNIPAQUE) 350 MG/ML injection    PRN Burnell Blanks, MD   65 mL at 03/28/21 1355   lidocaine (PF) (XYLOCAINE) 1 % injection    PRN Burnell Blanks, MD   5 mL at 03/28/21 1325   [MAR Hold] lisinopril (ZESTRIL) tablet 20 mg  20 mg Oral Daily Kyle, Tyrone A, DO   20 mg at 03/28/21 0909   [MAR Hold] loratadine (CLARITIN) tablet 10 mg  10 mg Oral Daily Kyle, Tyrone A, DO   10 mg at 03/28/21 0909    midazolam (VERSED) injection    PRN Burnell Blanks, MD   2 mg at 03/28/21  1312   [MAR Hold] ondansetron (ZOFRAN) injection 4 mg  4 mg Intravenous Q6H PRN Ronaldo Miyamoto, Tyrone A, DO       Radial Cocktail/Verapamil only    PRN Kathleene Hazel, MD   10 mL at 03/28/21 1326    Medications Prior to Admission  Medication Sig Dispense Refill Last Dose   amLODipine (NORVASC) 5 MG tablet Take 5 mg by mouth daily.   03/27/2021   Ascorbic Acid (VITAMIN C) 1000 MG tablet Take 1,000 mg by mouth daily.   03/25/2021   aspirin EC 81 MG tablet Take 81 mg by mouth daily.   03/13/2021   cetirizine (ZYRTEC) 10 MG tablet Take 10 mg by mouth daily as needed for allergies.   03/26/2021   diphenhydrAMINE (BENADRYL) 25 MG tablet Take 25 mg by mouth every 6 (six) hours as needed for allergies.   Past Week   fluticasone (FLONASE) 50 MCG/ACT nasal spray Place 1 spray into both nostrils daily as needed for allergies or rhinitis.   Past Week   lisinopril-hydrochlorothiazide (ZESTORETIC) 20-12.5 MG tablet Take 1 tablet by mouth daily.   03/27/2021   meloxicam (MOBIC) 15 MG tablet Take 15 mg by mouth daily.   03/27/2021   simvastatin (ZOCOR) 40 MG tablet Take 40 mg by mouth at bedtime.   03/26/2021   VIBERZI 100 MG TABS Take 100 mg by mouth daily.   03/27/2021    Family History  Problem Relation Age of Onset   Stroke Mother    Heart failure Father    Diabetes Father    Hypertension Brother      Review of Systems:      Cardiac Review of Systems: Y or  [    ]= no  Chest Pain [   x ]  Resting SOB [   ] Exertional SOB  [ x ]  Orthopnea [  ]   Pedal Edema [   ]    Palpitations [  ] Syncope  [  ]   Presyncope [   ]  General Review of Systems: [Y] = yes [  ]=no Constitional: recent weight change [  ]; anorexia [  ]; fatigue [  ]; nausea [  ]; night sweats [  ]; fever [  ]; or chills [  ]                                                               Dental: Last Dentist visit: Within the past few months  Eye  : blurred vision [  ]; diplopia [   ]; vision changes [  ];  Amaurosis fugax[  ]; Resp: cough [  ];  wheezing[  ];  hemoptysis[  ]; shortness of breath[  ]; paroxysmal nocturnal dyspnea[  ]; dyspnea on exertion[  ]; or orthopnea[  ];  GI:  gallstones[  ], vomiting[  ];  dysphagia[  ]; melena[  ];  hematochezia [  ]; heartburn[  ];   Hx of  Colonoscopy[  ]; GU: kidney stones [  ]; hematuria[  ];   dysuria [  ];  nocturia[  ];  history of     obstruction [  ]; urinary frequency [  ]  Skin: rash, swelling[  ];, hair loss[  ];  peripheral edema[  ];  or itching[  ]; Musculosketetal: myalgias[  ];  joint swelling[  ];  joint erythema[  ];  joint pain[  ];  back pain[  ];  Heme/Lymph: bruising[  ];  bleeding[  ];  anemia[  ];  Neuro: TIA[  ];  headaches[  ];  stroke[  ];  vertigo[  ];  seizures[  ];   paresthesias[  ];  difficulty walking[  ];  Psych:depression[  ]; anxiety[  ];  Endocrine: diabetes[  ];  thyroid dysfunction[  ];                Physical Exam: BP (!) 144/85   Pulse (!) 58   Temp 98.1 F (36.7 C) (Oral)   Resp (!) 21   Ht 6' (1.829 m)   Wt 93.7 kg   SpO2 94%   BMI 28.02 kg/m    General appearance: alert, cooperative, and no distress Head: Normocephalic, without obvious abnormality, atraumatic Neck: no adenopathy, no carotid bruit, no JVD, and supple, symmetrical, trachea midline Lymph nodes: No palpable cervical or clavicular adenopathy Resp: Breath sounds are full, equal, and clear to auscultation. Cardio: Regular rate and rhythm, no murmur or gallop.  Monitor shows sinus rhythm at 61/min GI: Soft and nontender, active bowel sounds. Extremities: There is left ankle prominence consistent with his fusion surgery 12 years ago.  Otherwise no deformity in the extremities.  All distal pulses are easily palpable.  There is a TR band in place over the right radial artery. Neurologic: Grossly normal  Diagnostic Studies & Laboratory Cunningham:  LEFT HEART CATH AND  CORONARY ANGIOGRAPHY   Conclusion      Prox RCA to Dist RCA lesion is 40% stenosed.   Dist RCA lesion is 99% stenosed.   Mid LM to Ost LAD lesion is 99% stenosed with 99% stenosed side branch in Ost Cx.   Severe, heavily calcified distal left main stenosis The LAD and Circumflex are relatively disease free beyond the ostial segments and appear to be good targets for bypass The RCA is heavily calcified with moderate mid stenosis. Severe distal RCA stenosis Normal LV filling pressure   Recommendations: Will admit to telemetry here at [facility]. Will resume IV heparin 4 hours post sheath pull. CT surgery consult for CABG. Continue ASA, statin and beta blocker.     Coronary Findings  Diagnostic Dominance: Right Left Main  Mid LM to Ost LAD lesion is 99% stenosed with 99% stenosed side branch in Ost Cx. The lesion is calcified.    Left Anterior Descending  Vessel is large.    Left Circumflex  Vessel is large.    Right Coronary Artery  Prox RCA to Dist RCA lesion is 40% stenosed. The lesion is calcified.  Dist RCA lesion is 99% stenosed. The lesion is calcified.    Intervention   No interventions have been documented.   Coronary Diagrams  Diagnostic Dominance: Right      Recent Radiology Findings:   DG Chest 2 View  Result Date: 03/27/2021 CLINICAL Cunningham:  Chest pain and shortness of breath EXAM: CHEST - 2 VIEW COMPARISON:  None. FINDINGS: The heart size and mediastinal contours are within normal limits. Large hiatal hernia. Left basilar atelectasis, lungs are otherwise clear. The visualized skeletal structures are unremarkable. IMPRESSION: 1.  Large hiatal hernia. 2.  Left basilar atelectasis, lungs otherwise clear. Electronically Signed   By: Miles Costain.O.  On: 03/27/2021 14:02     I have independently reviewed the above radiologic studies and discussed with the patient   Recent Lab Findings: Lab Results  Component Value Date   WBC 5.1 03/27/2021   HGB 16.4  03/27/2021   HCT 46.4 03/27/2021   PLT 315 03/27/2021   GLUCOSE 141 (H) 03/27/2021   CHOL 169 03/28/2021   TRIG 59 03/28/2021   HDL 80 03/28/2021   LDLCALC 77 03/28/2021   ALT 19 05/01/2017   AST 25 05/01/2017   NA 135 03/27/2021   K 3.2 (L) 03/27/2021   CL 100 03/27/2021   CREATININE 1.02 03/27/2021   BUN 10 03/27/2021   CO2 25 03/27/2021      Assessment / Plan:      -70 year old male presenting with acute coronary syndrome is found to have a complex high-grade distal left main stenosis with extension into the ostia of both the LAD and circumflex arteries.  Additionally, there is a long segment of the distal RCA with irregularity and a 40% stenosis followed by 890% stenosis just before the bifurcation of the PDA and PL.  Echocardiogram was performed earlier today and its report is pending.  We agree that coronary bypass grafting is certainly his best option for revascularization.  The procedure and expected perioperative course were explained to Jesus Cunningham and his wife in detail.  Multiple questions were asked and answered to their satisfaction.  They would like for [place] to proceed with the work-up in preparation for surgery as soon as possible.  He is currently pain-free resting in bed.  Heparin will be resumed later today.  -History of hypertension-managed with amlodipine and Zestoretic prior to admission.  -Dyslipidemia-he was taking simvastatin prior to admission but has been transitioned to atorvastatin  -History of irritable bowel syndrome treated with Viberzi  Dr. Kipp Brood will review Jesus Cunningham, angiography, and echocardiogram.  Discussion regarding timing of surgery will follow.  I  spent 25 minutes counseling the patient face to face.   Antony Odea, PA-C  03/28/2021 3:04 PM     Agree with above.  This is a 70 year old male this admitted with chest pain.  He underwent a left heart cath which showed severe left main/three-vessel coronary  artery disease.  He currently is asymptomatic.  I reviewed his echocardiogram he does have preserved biventricular function and no significant valvular disease.  We will plan for three-vessel CABG on 03/30/2021.  Kanaan Kagawa Bary Leriche

## 2021-03-28 NOTE — ED Notes (Signed)
Pt denies chest pain, SHOB at this time.

## 2021-03-28 NOTE — Progress Notes (Signed)
Pre-CABG study completed.  ° °Please see CV Proc for preliminary results.  ° °Laurena Valko, RDMS, RVT ° °

## 2021-03-28 NOTE — ED Notes (Signed)
Pt ambulatory to restroom

## 2021-03-28 NOTE — Progress Notes (Signed)
ANTICOAGULATION CONSULT NOTE   Pharmacy Consult for Heparin Indication: chest pain/ACS  No Known Allergies  Patient Measurements: Height: 6' (182.9 cm) Weight: 93.7 kg (206 lb 9.1 oz) IBW/kg (Calculated) : 77.6 Heparin Dosing Weight: 93.7 kg  Vital Signs: Temp: 98 F (36.7 C) (12/12 1514) Temp Source: Oral (12/12 1514) BP: 143/80 (12/12 1514) Pulse Rate: 57 (12/12 1514)  Labs: Recent Labs    03/27/21 1341 03/27/21 1530 03/27/21 1807 03/27/21 2003  HGB 16.4  --   --   --   HCT 46.4  --   --   --   PLT 315  --   --   --   CREATININE 1.02  --   --   --   TROPONINIHS 33* 48* 73* 56*    Estimated Creatinine Clearance: 80.1 mL/min (by C-G formula based on SCr of 1.02 mg/dL).   Assessment: 70 yoM to ED with chest pain, elevated troponins, intermittent chest pain over past 2 wk.  S/p cath - to resume heparin 4 hours post sheath removal, CVTS consult planned  Goal of Therapy:  Heparin level 0.3-0.7 units/ml Monitor platelets by anticoagulation protocol: Yes   Plan:  Begin heparin at 1300 units / hr at 1800 pm Daily CBC, heparin level   Elwin Sleight PharmD 03/28/2021,3:17 PM

## 2021-03-28 NOTE — Progress Notes (Signed)
  Transition of Care [facility]) Screening Note   Patient Details  Name: ARAD BURSTON Date of Birth: 08-08-1950   [facility] CM/SW Contact:    Delilah Shan, LCSWA Phone Number: 03/28/2021, 4:33 PM    Transition of Care Department [facility]) has reviewed patient and no TOC needs have been identified at this time. We will continue to monitor patient advancement through interdisciplinary progression rounds. If new patient transition needs arise, please place a TOC consult.

## 2021-03-28 NOTE — H&P (View-Only) (Signed)
Cardiology Consultation:   Patient ID: CAM SADUSKY MRN: [id number]; DOB: 30-Sep-1950  Admit date: 03/27/2021 Date of Consult: 03/28/2021  PCP:  Ludwig Clarks, [place] [facility] Cardiologist:  None  new  Patient Profile:   Jesus Cunningham is a 70 y.o. male with a hx of HTN, HLD, who is being seen 03/28/2021 for the evaluation of chest pain, elevated troponin, at the request of Dr Roderic Palau.  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  He also goes to the gym a couple of times a week.  Over the last couple of weeks, he would notice some chest tightness whenever he started to do something that was at the upper level of his normal exertion.  The symptoms were always relieved by rest and never lasted very long.  Yesterday, he was lifting some feed bags that were pretty heavy.  The chest pain started again, but was more severe and did not go away quickly.  It reached a 5 or 6 out of 10.  It made him feel little short of breath.  There was no nausea, vomiting or diaphoresis.  It was the same pain he had been having, but was increased in severity and duration.  He knew something was wrong and came to the hospital.  His symptoms have resolved with rest, and have not returned since he was admitted.  Currently he is resting comfortably.  When options were discussed with the patient, he prefers a definitive evaluation.   Past Medical History:  Diagnosis Date   Hyperlipidemia    Hypertension     Past Surgical History:  Procedure Laterality Date   ANKLE FUSION       Home Medications:  Prior to Admission medications   Medication Sig Start Date End Date Taking? Authorizing Provider  amLODipine (NORVASC) 5 MG tablet Take 5 mg by mouth daily. 02/23/21  Yes [provider]  Ascorbic Acid (VITAMIN C) 1000 MG tablet Take 1,000 mg by mouth daily.   Yes [provider]  aspirin EC 81 MG tablet Take 81 mg by  mouth daily.   Yes [provider]  cetirizine (ZYRTEC) 10 MG tablet Take 10 mg by mouth daily as needed for allergies.   Yes [provider]  diphenhydrAMINE (BENADRYL) 25 MG tablet Take 25 mg by mouth every 6 (six) hours as needed for allergies.   Yes [provider]  fluticasone (FLONASE) 50 MCG/ACT nasal spray Place 1 spray into both nostrils daily as needed for allergies or rhinitis.   Yes [provider]  lisinopril-hydrochlorothiazide (ZESTORETIC) 20-12.5 MG tablet Take 1 tablet by mouth daily. 02/23/21  Yes [provider]  meloxicam (MOBIC) 15 MG tablet Take 15 mg by mouth daily. 03/14/21  Yes [provider]  simvastatin (ZOCOR) 40 MG tablet Take 40 mg by mouth at bedtime. 01/31/21  Yes [provider]  VIBERZI 100 MG TABS Take 100 mg by mouth daily. 03/07/21  Yes [provider]    Inpatient Medications: Scheduled Meds:  amLODipine  5 mg Oral Daily   vitamin C  1,000 mg Oral Daily   aspirin EC  81 mg Oral Daily   Eluxadoline  100 mg Oral Daily   enoxaparin (LOVENOX) injection  40 mg Subcutaneous Q24H   lisinopril  20 mg Oral Daily   And   hydrochlorothiazide  12.5 mg Oral Daily   loratadine  10 mg Oral Daily  simvastatin  40 mg Oral QHS   Continuous Infusions:  PRN Meds: acetaminophen, ondansetron (ZOFRAN) IV  Allergies:   No Known Allergies  Social History:   Social History   Socioeconomic History   Marital status: Married    Spouse name: Not on file   Number of children: Not on file   Years of education: Not on file   Highest education level: Not on file  Occupational History   Not on file  Tobacco Use   Smoking status: Never   Smokeless tobacco: Former    Types: Chew    Quit date: 10/11/1981  Vaping Use   Vaping Use: Never used  Substance and Sexual Activity   Alcohol use: Yes    Alcohol/week: 4.0 standard drinks    Types: 4 Glasses of wine per week   Drug use: No   Sexual  activity: Not on file  Other Topics Concern   Not on file  Social History Narrative   Not on file   Social Determinants of Health   Financial Resource Strain: Not on file  Food Insecurity: Not on file  Transportation Needs: Not on file  Physical Activity: Not on file  Stress: Not on file  Social Connections: Not on file  Intimate Partner Violence: Not on file    Family History:   Family History  Problem Relation Age of Onset   Stroke Mother    Heart failure Father    Diabetes Father    Hypertension Brother      ROS:  Please see the history of present illness.  All other ROS reviewed and negative.     Physical Exam/Data:   Vitals:   03/28/21 0330 03/28/21 0430 03/28/21 0500 03/28/21 0600  BP: 131/81 120/74 120/78 131/88  Pulse: (!) 51 (!) 50 (!) 52 (!) 55  Resp: 15 18 19 17   Temp:      TempSrc:      SpO2: 94% 92% 92% 94%  Weight:      Height:        Intake/Output Summary (Last 24 hours) at 03/28/2021 0912 Last data filed at 03/27/2021 2021 Gross per 24 hour  Intake --  Output 600 ml  Net -600 ml   Last 3 Weights 03/27/2021 05/01/2017 10/25/2016  Weight (lbs) 206 lb 9.1 oz 206 lb 9.6 oz 210 lb  Weight (kg) 93.7 kg 93.713 kg 95.255 kg     Body mass index is 28.02 kg/m.  General:  Well nourished, well developed, in no acute distress HEENT: normal for age Neck: no JVD Vascular: No carotid bruits; Distal pulses 2+ bilaterally Cardiac:  normal S1, S2; RRR; no murmur  Lungs:  clear to auscultation bilaterally, no wheezing, rhonchi or rales  Abd: soft, nontender, no hepatomegaly  Ext: no edema Musculoskeletal:  No deformities, BUE and BLE strength normal and equal Skin: warm and dry  Neuro:  CNs 2-12 intact, no focal abnormalities noted Psych:  Normal affect   EKG:  The EKG was personally reviewed and demonstrates:  03/27/2021 @ 13:42, SR, HR 84, diffuse ST depression, improved on subsequent ECG Telemetry:  Telemetry was personally reviewed and demonstrates:  Sinus rhythm, sinus bradycardia to the high 40s  Relevant CV Studies:  ECHO: ordered  Laboratory Data:  High Sensitivity Troponin:   Recent Labs  Lab 03/27/21 1341 03/27/21 1530 03/27/21 1807 03/27/21 2003  TROPONINIHS 33* 48* 73* 56*     Chemistry Recent Labs  Lab 03/27/21 1341 03/27/21 1808  NA 135  --  K 3.2*  --   CL 100  --   CO2 25  --   GLUCOSE 141*  --   BUN 10  --   CREATININE 1.02 0.81  CALCIUM 9.4  --   GFRNONAA >60 >60  ANIONGAP 10  --     No results for input(s): PROT, ALBUMIN, AST, ALT, ALKPHOS, BILITOT in the last 168 hours. Lipids  Recent Labs  Lab 03/28/21 0230  CHOL 169  TRIG 59  HDL 80  LDLCALC 77  CHOLHDL 2.1    Hematology Recent Labs  Lab 03/27/21 1341 03/27/21 1808  WBC 5.1 4.5  RBC 4.98 4.54  HGB 16.4 15.0  HCT 46.4 42.5  MCV 93.2 93.6  MCH 32.9 33.0  MCHC 35.3 35.3  RDW 12.6 12.7  PLT 315 282   Thyroid No results for input(s): TSH, FREET4 in the last 168 hours.  BNPNo results for input(s): BNP, PROBNP in the last 168 hours.  DDimer No results for input(s): DDIMER in the last 168 hours.   Radiology/Studies:  DG Chest 2 View  Result Date: 03/27/2021 CLINICAL DATA:  Chest pain and shortness of breath EXAM: CHEST - 2 VIEW COMPARISON:  None. FINDINGS: The heart size and mediastinal contours are within normal limits. Large hiatal hernia. Left basilar atelectasis, lungs are otherwise clear. The visualized skeletal structures are unremarkable. IMPRESSION: 1.  Large hiatal hernia. 2.  Left basilar atelectasis, lungs otherwise clear. Electronically Signed   By: Larose Hires D.O.   On: 03/27/2021 14:02     Assessment and Plan:   Non-STEMI -He was having consistent exertional chest pain, that increased with increased activity - Cardiac enzymes are abnormal - His initial ECG shows some diffuse ST depression that improved on subsequent ECG - Cardiac catheterization was discussed with the patient fully. The patient understands  that risks include but are not limited to stroke (1 in 1000), death (1 in 1000), kidney failure [usually temporary] (1 in 500), bleeding (1 in 200), allergic reaction [possibly serious] (1 in 200).  The patient understands and is willing to proceed.    2.  Hypertension -Hold HCTZ for, continue lisinopril -Avoid beta-blocker with resting bradycardia  3.  Hyperlipidemia: - LDL 77 on Zocor 40 mg daily - Recommend changing to Lipitor 40 mg daily   Risk Assessment/Risk Scores:     TIMI Risk Score for Unstable Angina or Non-ST Elevation MI:   The patient's TIMI risk score is 5, which indicates a 26% risk of all cause mortality, new or recurrent myocardial infarction or need for urgent revascularization in the next 14 days.   For questions or updates, please contact [facility] Please consult [email or web address] for contact info under    Signed, Theodore Demark, PA-C  03/28/2021 9:12 AM  Patient seen and examined.  Agree with above documentation.  Mr. Bevins is a 70 year old male with history of hypertension, hyperlipidemia who we are consulted by Dr. Kerry Hough for evaluation of chest pain.  He reports that over the last few weeks he has begun having exertional chest pain.  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  Typically would only last for about 2 minutes.  However yesterday reports that he was lifting some bags of feed and started having chest pain that was similar to what he had been having but more intense and lasted longer, about 8 to 10 minutes.  Given the symptoms he came to the ED.  On presentation to the ED, initial vital signs notable for BP 165/88, SPO2 97%, pulse 85.  Labs notable for potassium 3.2, creatinine 1.0, WBC 5.1, hemoglobin 16.4, platelets 315, COVID-19 negative, LDL 77, troponin 33 > 48 > 73 > 56.  Chest x-ray showed large hiatal hernia, left basilar atelectasis.  EKG shows sinus rhythm, rate 84, PVC, ST  elevation in aVR with diffuse ST depressions, improved on repeat EKG.  Presentation consistent with an acute coronary syndrome, as he describes worsening exertional chest pain over the last few weeks and presents with mild troponin elevation and ischemic changes on EKG.  Currently chest pain-free.  Recommend cardiac catheterization for further evaluation.  Start heparin drip.  We will switch simvastatin to atorvastatin 40 mg daily.  Risks and benefits of cardiac catheterization have been discussed with the patient.  These include bleeding, infection, kidney damage, stroke, heart attack, death.  The patient understands these risks and is willing to proceed.  Donato Heinz, MD

## 2021-03-28 NOTE — Progress Notes (Signed)
Echocardiogram 2D Echocardiogram has been performed.  Leta Jungling M 03/28/2021, 11:08 AM

## 2021-03-28 NOTE — Interval H&P Note (Signed)
History and Physical Interval Note:  03/28/2021 12:52 PM  COTTON BECKLEY  has presented today for surgery, with the diagnosis of nstemi.  The various methods of treatment have been discussed with the patient and family. After consideration of risks, benefits and other options for treatment, the patient has consented to  Procedure(s): LEFT HEART CATH AND CORONARY ANGIOGRAPHY (N/A) as a surgical intervention.  The patient's history has been reviewed, patient examined, no change in status, stable for surgery.  I have reviewed the patient's chart and labs.  Questions were answered to the patient's satisfaction.    Cath Lab Visit (complete for each Cath Lab visit)  Clinical Evaluation Leading to the Procedure:   ACS: Yes.    Non-ACS:    Anginal Classification: CCS III  Anti-ischemic medical therapy: Minimal Therapy (1 class of medications)  Non-Invasive Test Results: No non-invasive testing performed  Prior CABG: No previous CABG        Verne Carrow

## 2021-03-29 ENCOUNTER — Encounter (HOSPITAL_COMMUNITY): Payer: Self-pay | Admitting: Cardiovascular Disease

## 2021-03-29 LAB — BLOOD GAS, ARTERIAL
Acid-base deficit: 1.7 mmol/L (ref 0.0–2.0)
Bicarbonate: 22 mmol/L (ref 20.0–28.0)
Drawn by: 38235
FIO2: 21
O2 Saturation: 96.8 %
Patient temperature: 37
pCO2 arterial: 33.5 mmHg (ref 32.0–48.0)
pH, Arterial: 7.432 (ref 7.350–7.450)
pO2, Arterial: 84.1 mmHg (ref 83.0–108.0)

## 2021-03-29 LAB — CBC
HCT: 41.4 % (ref 39.0–52.0)
Hemoglobin: 14.3 g/dL (ref 13.0–17.0)
MCH: 32.3 pg (ref 26.0–34.0)
MCHC: 34.5 g/dL (ref 30.0–36.0)
MCV: 93.5 fL (ref 80.0–100.0)
Platelets: 270 10*3/uL (ref 150–400)
RBC: 4.43 MIL/uL (ref 4.22–5.81)
RDW: 12.5 % (ref 11.5–15.5)
WBC: 4.3 10*3/uL (ref 4.0–10.5)
nRBC: 0 % (ref 0.0–0.2)

## 2021-03-29 LAB — URINALYSIS, ROUTINE W REFLEX MICROSCOPIC
Bilirubin Urine: NEGATIVE
Glucose, UA: NEGATIVE mg/dL
Hgb urine dipstick: NEGATIVE
Ketones, ur: NEGATIVE mg/dL
Leukocytes,Ua: NEGATIVE
Nitrite: NEGATIVE
Protein, ur: NEGATIVE mg/dL
Specific Gravity, Urine: 1.025 (ref 1.005–1.030)
pH: 6 (ref 5.0–8.0)

## 2021-03-29 LAB — HEPARIN LEVEL (UNFRACTIONATED)
Heparin Unfractionated: 0.26 IU/mL — ABNORMAL LOW (ref 0.30–0.70)
Heparin Unfractionated: 0.35 IU/mL (ref 0.30–0.70)

## 2021-03-29 LAB — SURGICAL PCR SCREEN
MRSA, PCR: NEGATIVE
Staphylococcus aureus: POSITIVE — AB

## 2021-03-29 LAB — BASIC METABOLIC PANEL
Anion gap: 11 (ref 5–15)
BUN: 10 mg/dL (ref 8–23)
CO2: 21 mmol/L — ABNORMAL LOW (ref 22–32)
Calcium: 8.9 mg/dL (ref 8.9–10.3)
Chloride: 100 mmol/L (ref 98–111)
Creatinine, Ser: 0.91 mg/dL (ref 0.61–1.24)
GFR, Estimated: >60 mL/min (ref >60)
Glucose, Bld: 94 mg/dL (ref 70–99)
Potassium: 3.7 mmol/L (ref 3.5–5.1)
Sodium: 132 mmol/L — ABNORMAL LOW (ref 135–145)

## 2021-03-29 LAB — PREPARE RBC (CROSSMATCH)

## 2021-03-29 LAB — ABO/RH: ABO/RH(D): O POS

## 2021-03-29 MED ORDER — VANCOMYCIN HCL 1500 MG/300ML IV SOLN
1500.0000 mg | INTRAVENOUS | Status: AC
  Administered 2021-03-30: 09:00:00 1500 mg via INTRAVENOUS
  Filled 2021-03-29: qty 300

## 2021-03-29 MED ORDER — NITROGLYCERIN IN D5W 200-5 MCG/ML-% IV SOLN
2.0000 ug/min | INTRAVENOUS | Status: DC
  Filled 2021-03-29: qty 250

## 2021-03-29 MED ORDER — PLASMA-LYTE A IV SOLN
INTRAVENOUS | Status: DC
  Filled 2021-03-29: qty 5

## 2021-03-29 MED ORDER — CEFAZOLIN SODIUM-DEXTROSE 2-4 GM/100ML-% IV SOLN
2.0000 g | INTRAVENOUS | Status: AC
  Administered 2021-03-30: 12:00:00 2 g via INTRAVENOUS
  Filled 2021-03-29: qty 100

## 2021-03-29 MED ORDER — EPINEPHRINE HCL 5 MG/250ML IV SOLN IN NS
0.0000 ug/min | INTRAVENOUS | Status: DC
  Filled 2021-03-29: qty 250

## 2021-03-29 MED ORDER — CHLORHEXIDINE GLUCONATE 0.12 % MT SOLN
15.0000 mL | Freq: Once | OROMUCOSAL | Status: AC
  Administered 2021-03-30: 06:00:00 15 mL via OROMUCOSAL
  Filled 2021-03-29: qty 15

## 2021-03-29 MED ORDER — CEFAZOLIN SODIUM-DEXTROSE 2-4 GM/100ML-% IV SOLN
2.0000 g | INTRAVENOUS | Status: AC
  Administered 2021-03-30: 09:00:00 2 g via INTRAVENOUS
  Filled 2021-03-29: qty 100

## 2021-03-29 MED ORDER — TRANEXAMIC ACID 1000 MG/10ML IV SOLN
1.5000 mg/kg/h | INTRAVENOUS | Status: AC
  Administered 2021-03-30: 09:00:00 1.5 mg/kg/h via INTRAVENOUS
  Filled 2021-03-29: qty 25

## 2021-03-29 MED ORDER — PHENYLEPHRINE HCL-NACL 20-0.9 MG/250ML-% IV SOLN
30.0000 ug/min | INTRAVENOUS | Status: AC
  Administered 2021-03-30: 09:00:00 15 ug/min via INTRAVENOUS
  Filled 2021-03-29: qty 250

## 2021-03-29 MED ORDER — INSULIN REGULAR(HUMAN) IN NACL 100-0.9 UT/100ML-% IV SOLN
INTRAVENOUS | Status: AC
  Administered 2021-03-30: 10:00:00 .8 [IU]/h via INTRAVENOUS
  Filled 2021-03-29: qty 100

## 2021-03-29 MED ORDER — POTASSIUM CHLORIDE 2 MEQ/ML IV SOLN
80.0000 meq | INTRAVENOUS | Status: DC
  Filled 2021-03-29: qty 40

## 2021-03-29 MED ORDER — BISACODYL 5 MG PO TBEC: 5.0000 mg | DELAYED_RELEASE_TABLET | Freq: Once | ORAL | Status: DC

## 2021-03-29 MED ORDER — METOPROLOL TARTRATE 12.5 MG HALF TABLET
12.5000 mg | ORAL_TABLET | Freq: Once | ORAL | Status: AC
  Administered 2021-03-30: 06:00:00 12.5 mg via ORAL
  Filled 2021-03-29: qty 1

## 2021-03-29 MED ORDER — MILRINONE LACTATE IN DEXTROSE 20-5 MG/100ML-% IV SOLN
0.3000 ug/kg/min | INTRAVENOUS | Status: DC
  Filled 2021-03-29: qty 100

## 2021-03-29 MED ORDER — CHLORHEXIDINE GLUCONATE CLOTH 2 % EX PADS
6.0000 | MEDICATED_PAD | Freq: Once | CUTANEOUS | Status: AC
  Administered 2021-03-29: 6 via TOPICAL

## 2021-03-29 MED ORDER — DEXMEDETOMIDINE HCL IN NACL 400 MCG/100ML IV SOLN
0.1000 ug/kg/h | INTRAVENOUS | Status: AC
  Administered 2021-03-30: 11:00:00 .5 ug/kg/h via INTRAVENOUS
  Filled 2021-03-29: qty 100

## 2021-03-29 MED ORDER — TRANEXAMIC ACID (OHS) BOLUS VIA INFUSION
15.0000 mg/kg | INTRAVENOUS | Status: AC
  Administered 2021-03-30: 09:00:00 1405.5 mg via INTRAVENOUS
  Filled 2021-03-29: qty 1406

## 2021-03-29 MED ORDER — CHLORHEXIDINE GLUCONATE CLOTH 2 % EX PADS
6.0000 | MEDICATED_PAD | Freq: Once | CUTANEOUS | Status: AC
  Administered 2021-03-30: 05:00:00 6 via TOPICAL

## 2021-03-29 MED ORDER — HEPARIN 30,000 UNITS/1000 ML (OHS) CELLSAVER SOLUTION
Status: DC
  Filled 2021-03-29: qty 1000

## 2021-03-29 MED ORDER — NOREPINEPHRINE 4 MG/250ML-% IV SOLN
0.0000 ug/min | INTRAVENOUS | Status: DC
  Filled 2021-03-29: qty 250

## 2021-03-29 MED ORDER — TRANEXAMIC ACID (OHS) PUMP PRIME SOLUTION
2.0000 mg/kg | INTRAVENOUS | Status: DC
  Filled 2021-03-29: qty 1.87

## 2021-03-29 MED ORDER — MANNITOL 20 % IV SOLN
INTRAVENOUS | Status: DC
  Filled 2021-03-29: qty 13

## 2021-03-29 MED ORDER — TEMAZEPAM 15 MG PO CAPS
15.0000 mg | ORAL_CAPSULE | Freq: Once | ORAL | Status: AC | PRN
  Administered 2021-03-29: 15 mg via ORAL
  Filled 2021-03-29: qty 1

## 2021-03-29 NOTE — Anesthesia Preprocedure Evaluation (Addendum)
Anesthesia Evaluation  Patient identified by MRN, date of birth, ID band Patient awake    Reviewed: Allergy & Precautions, NPO status , Patient's Chart, lab work & pertinent test results  Airway Mallampati: II  TM Distance: >3 FB Neck ROM: Full    Dental no notable dental hx.    Pulmonary neg pulmonary ROS,    Pulmonary exam normal        Cardiovascular hypertension, + CAD and + Past MI   Rhythm:Regular Rate:Normal     Neuro/Psych negative neurological ROS  negative psych ROS   GI/Hepatic negative GI ROS, Neg liver ROS,   Endo/Other  negative endocrine ROS  Renal/GU negative Renal ROS  negative genitourinary   Musculoskeletal negative musculoskeletal ROS (+)   Abdominal Normal abdominal exam  (+)   Peds  Hematology negative hematology ROS (+)   Anesthesia Other Findings   Reproductive/Obstetrics                            Anesthesia Physical Anesthesia Plan  ASA: 4  Anesthesia Plan: General   Post-op Pain Management:    Induction: Intravenous  PONV Risk Score and Plan: 2 and Midazolam and Treatment may vary due to age or medical condition  Airway Management Planned: Mask and Oral ETT  Additional Equipment: Arterial line, CVP, TEE, 3D TEE and Ultrasound Guidance Line Placement  Intra-op Plan:   Post-operative Plan: Post-operative intubation/ventilation  Informed Consent: I have reviewed the patients History and Physical, chart, labs and discussed the procedure including the risks, benefits and alternatives for the proposed anesthesia with the patient or authorized representative who has indicated his/her understanding and acceptance.     Dental advisory given  Plan Discussed with: CRNA  Anesthesia Plan Comments: [facility]   Lab Results      Component                Value               Date                      WBC                      4.3                 03/29/2021                 HGB                      14.3                03/29/2021                HCT                      41.4                03/29/2021                MCV                      93.5                03/29/2021                PLT  270                 03/29/2021           Lab Results      Component                Value               Date                      NA                       132 (L)             03/29/2021                K                        3.7                 03/29/2021                CO2                      21 (L)              03/29/2021                GLUCOSE                  94                  03/29/2021                BUN                      10                  03/29/2021                CREATININE               0.91                03/29/2021                CALCIUM                  8.9                 03/29/2021                GFRNONAA                 >60                 03/29/2021           ECHO 03/27/21: 1. Left ventricular ejection fraction, by estimation, is 60 to 65%. The  left ventricle has normal function. Left ventricular endocardial border  not optimally defined to evaluate regional wall motion. Left ventricular  diastolic parameters are consistent  with Grade I diastolic dysfunction (impaired relaxation).  2. Right ventricular systolic function is normal. The right ventricular  size is normal.  3. The mitral valve is normal in structure. No evidence of mitral valve  regurgitation.  4. The aortic valve is grossly normal. Aortic valve regurgitation is not  visualized.  5. Not well  visualized.  Cardiac Cath 03/28/21:   Prox RCA to Dist RCA lesion is 40% stenosed.   Dist RCA lesion is 99% stenosed.   Mid LM to Ost LAD lesion is 99% stenosed with 99% stenosed side branch in Ost Cx.  1. Severe, heavily calcified distal left main stenosis 2. The LAD and Circumflex are relatively disease free beyond the ostial segments and appear to be  good targets for bypass 3. The RCA is heavily calcified with moderate mid stenosis. Severe distal RCA stenosis 4. Normal LV filling pressure     )       Anesthesia Quick Evaluation

## 2021-03-29 NOTE — Progress Notes (Signed)
CARDIAC REHAB PHASE I   Preop education completed with pt and spouse. Pt given IS, Cardiac Surgery booklet, in-the-tube sheet, and OHS care guide. Pt educated on importance of IS, walks, and sternal precautions post-op. Questions/concerns addressed. Will continue to follow throughout hospital stay.  [id number] Reynold Bowen, RN BSN 03/29/2021 2:23 PM

## 2021-03-29 NOTE — Progress Notes (Signed)
ANTICOAGULATION CONSULT NOTE   Pharmacy Consult for Heparin Indication: chest pain/ACS  No Known Allergies  Patient Measurements: Height: 6' (182.9 cm) Weight: 93.7 kg (206 lb 9.1 oz) IBW/kg (Calculated) : 77.6 Heparin Dosing Weight: 93.7 kg  Vital Signs: Temp: 98 F (36.7 C) (12/13 0656) Temp Source: Oral (12/13 0656) BP: 131/83 (12/13 0656) Pulse Rate: 60 (12/13 0656)  Labs: Recent Labs    03/27/21 1341 03/27/21 1530 03/27/21 1807 03/27/21 2003 03/29/21 0345 03/29/21 1104  HGB 16.4  --   --   --  14.3  --   HCT 46.4  --   --   --  41.4  --   PLT 315  --   --   --  270  --   HEPARINUNFRC  --   --   --   --  0.26* 0.35  CREATININE 1.02  --   --   --  0.91  --   TROPONINIHS 33* 48* 73* 56*  --   --     Estimated Creatinine Clearance: 89.7 mL/min (by C-G formula based on SCr of 0.91 mg/dL).   Assessment: 70 yoM to ED with chest pain, elevated troponins, intermittent chest pain over past 2 wk.  S/p cath - to resume heparin 4 hours post sheath removal, CVTS consult planned  Heparin level at goal, no issues noted. Hgb down 16>14.3 from 12/11. No bleeding issues noted.   Goal of Therapy:  Heparin level 0.3-0.7 units/ml Monitor platelets by anticoagulation protocol: Yes   Plan:  Continue heparin at 1400 units/hr Daily CBC, heparin level  Sheppard Coil PharmD., BCPS Clinical Pharmacist 03/29/2021 11:51 AM c

## 2021-03-29 NOTE — Progress Notes (Signed)
ANTICOAGULATION CONSULT NOTE - Follow Up Consult  Pharmacy Consult for heparin Indication:  CAD awaiting CABG  Labs: Recent Labs    03/27/21 1341 03/27/21 1530 03/27/21 1807 03/27/21 2003 03/29/21 0345  HGB 16.4  --   --   --  14.3  HCT 46.4  --   --   --  41.4  PLT 315  --   --   --  270  HEPARINUNFRC  --   --   --   --  0.26*  CREATININE 1.02  --   --   --   --   TROPONINIHS 33* 48* 73* 56*  --     Assessment: 70yo male subtherapeutic on heparin after resumed post-cath; no infusion issues or signs of bleeding per RN.  Goal of Therapy:  Heparin level 0.3-0.7 units/ml   Plan:  Will increase heparin infusion by 1 unit/kg/hr to 1400 units/hr and check level in 6 hours.    Vernard Gambles, PharmD, BCPS  03/29/2021,5:26 AM

## 2021-03-29 NOTE — Progress Notes (Signed)
Progress Note  Patient Name: Jesus Cunningham Date of Encounter: 03/29/2021  [facility] Cardiologist: None   Subjective   BP 131/83 this morning.  Denies any chest pain or dyspnea.  Inpatient Medications    Scheduled Meds:  amLODipine  5 mg Oral Daily   vitamin C  1,000 mg Oral Daily   aspirin EC  81 mg Oral Daily   atorvastatin  80 mg Oral Daily   Eluxadoline  100 mg Oral Daily   lisinopril  20 mg Oral Daily   loratadine  10 mg Oral Daily   mupirocin ointment  1 application Nasal BID   sodium chloride flush  3 mL Intravenous Q12H   Continuous Infusions:  sodium chloride     heparin 1,400 Units/hr (03/29/21 0537)   PRN Meds: sodium chloride, acetaminophen, ondansetron (ZOFRAN) IV, sodium chloride flush   Vital Signs    Vitals:   03/28/21 1755 03/28/21 1931 03/28/21 2348 03/29/21 0656  BP: (!) 164/89 128/83 123/78 131/83  Pulse: 63 65 64 60  Resp: 17 16 17 17   Temp:  97.8 F (36.6 C) 97.8 F (36.6 C) 98 F (36.7 C)  TempSrc:  Oral Oral Oral  SpO2: 95% 98% 96% 95%  Weight:      Height:        Intake/Output Summary (Last 24 hours) at 03/29/2021 1011 Last data filed at 03/28/2021 2308 Gross per 24 hour  Intake 1179.59 ml  Output --  Net 1179.59 ml   Last 3 Weights 03/27/2021 05/01/2017 10/25/2016  Weight (lbs) 206 lb 9.1 oz 206 lb 9.6 oz 210 lb  Weight (kg) 93.7 kg 93.713 kg 95.255 kg      Telemetry    Normal sinus rhythm rate 50s to 70s- Personally Reviewed  ECG    No new EKG- Personally Reviewed  Physical Exam   GEN: No acute distress.   Neck: No JVD Cardiac: RRR, no murmurs, rubs, or gallops.  Respiratory: Clear to auscultation bilaterally. GI: Soft, nontender, non-distended  MS: No edema; No deformity. Neuro:  Nonfocal  Psych: Normal affect   Labs    High Sensitivity Troponin:   Recent Labs  Lab 03/27/21 1341 03/27/21 1530 03/27/21 1807 03/27/21 2003  TROPONINIHS 33* 48* 73* 56*     Chemistry Recent Labs  Lab  03/27/21 1341 03/29/21 0345  NA 135 132*  K 3.2* 3.7  CL 100 100  CO2 25 21*  GLUCOSE 141* 94  BUN 10 10  CREATININE 1.02 0.91  CALCIUM 9.4 8.9  GFRNONAA >60 >60  ANIONGAP 10 11    Lipids  Recent Labs  Lab 03/28/21 0230  CHOL 169  TRIG 59  HDL 80  LDLCALC 77  CHOLHDL 2.1    Hematology Recent Labs  Lab 03/27/21 1341 03/29/21 0345  WBC 5.1 4.3  RBC 4.98 4.43  HGB 16.4 14.3  HCT 46.4 41.4  MCV 93.2 93.5  MCH 32.9 32.3  MCHC 35.3 34.5  RDW 12.6 12.5  PLT 315 270   Thyroid No results for input(s): TSH, FREET4 in the last 168 hours.  BNPNo results for input(s): BNP, PROBNP in the last 168 hours.  DDimer No results for input(s): DDIMER in the last 168 hours.   Radiology    DG Chest 2 View  Result Date: 03/27/2021 CLINICAL DATA:  Chest pain and shortness of breath EXAM: CHEST - 2 VIEW COMPARISON:  None. FINDINGS: The heart size and mediastinal contours are within normal limits. Large hiatal hernia. Left basilar atelectasis,  lungs are otherwise clear. The visualized skeletal structures are unremarkable. IMPRESSION: 1.  Large hiatal hernia. 2.  Left basilar atelectasis, lungs otherwise clear. Electronically Signed   By: Keane Police D.O.   On: 03/27/2021 14:02   CARDIAC CATHETERIZATION  Result Date: 03/28/2021   Prox RCA to Dist RCA lesion is 40% stenosed.   Dist RCA lesion is 99% stenosed.   Mid LM to Ost LAD lesion is 99% stenosed with 99% stenosed side branch in Ost Cx. Severe, heavily calcified distal left main stenosis The LAD and Circumflex are relatively disease free beyond the ostial segments and appear to be good targets for bypass The RCA is heavily calcified with moderate mid stenosis. Severe distal RCA stenosis Normal LV filling pressure Recommendations: Will admit to telemetry here at [facility]. Will resume IV heparin 4 hours post sheath pull. CT surgery consult for CABG. Continue ASA, statin and beta blocker.   ECHOCARDIOGRAM COMPLETE  Result Date: 03/28/2021     ECHOCARDIOGRAM REPORT   Patient Name:   Jesus Cunningham Date of Exam: 03/28/2021 Medical Rec #:  [id number]        Height:       72.0 in Accession #:    [id number]       Weight:       206.6 lb Date of Birth:  [birth year]       BSA:          2.160 m Patient Age:    31 years         BP:           132/88 mmHg Patient Gender: M                HR:           58 bpm. Exam Location:  Inpatient Procedure: 3D Echo, 2D Echo, Strain Analysis, Cardiac Doppler and Color Doppler Indications:    Chest Pain R07.9  History:        Patient has no prior history of Echocardiogram examinations.                 Risk Factors:Hypertension and Dyslipidemia.  Sonographer:    Darlina Sicilian RDCS Referring Phys: [id number] [place]  1. Left ventricular ejection fraction, by estimation, is 60 to 65%. The left ventricle has normal function. Left ventricular endocardial border not optimally defined to evaluate regional wall motion. Left ventricular diastolic parameters are consistent with Grade I diastolic dysfunction (impaired relaxation).  2. Right ventricular systolic function is normal. The right ventricular size is normal.  3. The mitral valve is normal in structure. No evidence of mitral valve regurgitation.  4. The aortic valve is grossly normal. Aortic valve regurgitation is not visualized.  5. Not well visualized. Comparison(s): No prior Echocardiogram. FINDINGS  Left Ventricle: Left ventricular ejection fraction, by estimation, is 60 to 65%. The left ventricle has normal function. Left ventricular endocardial border not optimally defined to evaluate regional wall motion. The left ventricular internal cavity size was normal in size. There is no left ventricular hypertrophy of the basal-septal segment. Left ventricular diastolic parameters are consistent with Grade I diastolic dysfunction (impaired relaxation). Right Ventricle: The right ventricular size is normal. No increase in right ventricular wall thickness. Right  ventricular systolic function is normal. Left Atrium: Left atrial size was normal in size. Right Atrium: Right atrial size was normal in size. Pericardium: There is no evidence of pericardial effusion. Mitral Valve: The mitral valve is normal in structure. No  evidence of mitral valve regurgitation. Tricuspid Valve: The tricuspid valve is not well visualized. Tricuspid valve regurgitation is not demonstrated. Aortic Valve: The aortic valve is grossly normal. Aortic valve regurgitation is not visualized. Pulmonic Valve: The pulmonic valve was grossly normal. Pulmonic valve regurgitation is trivial. Aorta: The aortic root and ascending aorta are structurally normal, with no evidence of dilitation. Venous: Not well visualized. IAS/Shunts: The interatrial septum was not well visualized.  LEFT VENTRICLE PLAX 2D LVIDd:         4.20 cm   Diastology LVIDs:         3.00 cm   LV e' medial:    5.40 cm/s LV PW:         0.90 cm   LV E/e' medial:  8.4 LV IVS:        1.00 cm   LV e' lateral:   6.24 cm/s LVOT diam:     2.60 cm   LV E/e' lateral: 7.3 LV SV:         98 LV SV Index:   45 LVOT Area:     5.31 cm                           3D Volume EF:                          3D EF:        64 %                          LV EDV:       140 ml                          LV ESV:       50 ml                          LV SV:        90 ml RIGHT VENTRICLE RV S prime:     16.30 cm/s TAPSE (M-mode): 3.2 cm LEFT ATRIUM             Index        RIGHT ATRIUM           Index LA diam:        3.10 cm 1.44 cm/m   RA Area:     15.80 cm LA Vol (A2C):   54.4 ml 25.18 ml/m  RA Volume:   30.90 ml  14.31 ml/m LA Vol (A4C):   39.1 ml 18.10 ml/m LA Biplane Vol: 46.9 ml 21.71 ml/m  AORTIC VALVE LVOT Vmax:   74.60 cm/s LVOT Vmean:  54.300 cm/s LVOT VTI:    0.185 m  AORTA Ao Root diam: 3.90 cm Ao Asc diam:  3.60 cm MITRAL VALVE MV Area (PHT): 2.76 cm    SHUNTS MV Decel Time: 275 msec    Systemic VTI:  0.18 m MV E velocity: 45.30 cm/s  Systemic Diam: 2.60 cm MV  A velocity: 47.40 cm/s MV E/A ratio:  0.96 Mary Scientist, physiological signed by Phineas Inches Signature Date/Time: 03/28/2021/4:29:01 PM    Final    VAS US DOPPLER PRE CABG  Result Date: 03/28/2021 PREOPERATIVE VASCULAR EVALUATION Patient Name:  Jesus Cunningham  Date of Exam:   03/28/2021 Medical Rec #: [id number]  Accession #:    [id number] Date of Birth: 1950/08/13        Patient Gender: M Patient Age:   30 years Exam Location:  [facility] Procedure:      VAS US DOPPLER PRE CABG Referring Phys: HARRELL LIGHTFOOT --------------------------------------------------------------------------------  Indications:      Pre-CABG. Risk Factors:     Hypertension, hyperlipidemia, past history of smoking, prior                   MI. Comparison Study: No prior studies. Performing Technologist: Darlin Coco RDMS, RVT  Examination Guidelines: A complete evaluation includes B-mode imaging, spectral Doppler, color Doppler, and power Doppler as needed of all accessible portions of each vessel. Bilateral testing is considered an integral part of a complete examination. Limited examinations for reoccurring indications may be performed as noted.  Right Carotid Findings: +----------+--------+--------+--------+--------+------------------+             PSV cm/s EDV cm/s Stenosis Describe Comments            +----------+--------+--------+--------+--------+------------------+  CCA Prox   81       21                                             +----------+--------+--------+--------+--------+------------------+  CCA Distal 75       22                         intimal thickening  +----------+--------+--------+--------+--------+------------------+  ICA Prox   68       26                                             +----------+--------+--------+--------+--------+------------------+  ICA Distal 37       13                                             +----------+--------+--------+--------+--------+------------------+  ECA        86        12                                             +----------+--------+--------+--------+--------+------------------+ +----------+--------+-------+--------+------------+             PSV cm/s EDV cms Describe Arm Pressure  +----------+--------+-------+--------+------------+  Subclavian 189              Tortuous               +----------+--------+-------+--------+------------+ +---------+--------+--+--------+--+---------+  Vertebral PSV cm/s 64 EDV cm/s 17 Antegrade  +---------+--------+--+--------+--+---------+ Left Carotid Findings: +----------+--------+--------+--------+--------+------------------+             PSV cm/s EDV cm/s Stenosis Describe Comments            +----------+--------+--------+--------+--------+------------------+  CCA Prox   142      24                                             +----------+--------+--------+--------+--------+------------------+  CCA Distal 78       14                         intimal thickening  +----------+--------+--------+--------+--------+------------------+  ICA Prox   47       13                                             +----------+--------+--------+--------+--------+------------------+  ICA Distal 31       11                                             +----------+--------+--------+--------+--------+------------------+  ECA        86       8                                              +----------+--------+--------+--------+--------+------------------+ +----------+--------+--------+----------------+------------+  Subclavian PSV cm/s EDV cm/s Describe         Arm Pressure  +----------+--------+--------+----------------+------------+             120               Multiphasic, WNL               +----------+--------+--------+----------------+------------+ +---------+--------+--+--------+--+---------+  Vertebral PSV cm/s 69 EDV cm/s 13 Antegrade  +---------+--------+--+--------+--+---------+  ABI Findings: +--------+------------------+-----+---------+--------+  Right     Rt Pressure (mmHg) Index Waveform  Comment   +--------+------------------+-----+---------+--------+  Brachial                          triphasic           +--------+------------------+-----+---------+--------+  PTA                               triphasic           +--------+------------------+-----+---------+--------+  DP                                triphasic           +--------+------------------+-----+---------+--------+ +--------+------------------+-----+---------+-------+  Left     Lt Pressure (mmHg) Index Waveform  Comment  +--------+------------------+-----+---------+-------+  Brachial                          triphasic          +--------+------------------+-----+---------+-------+  PTA                               triphasic          +--------+------------------+-----+---------+-------+  DP                                triphasic          +--------+------------------+-----+---------+-------+  Right Doppler Findings: +--------+--------+-----+---------+--------+  Site     Pressure Index Doppler   Comments  +--------+--------+-----+---------+--------+  Brachial                triphasic           +--------+--------+-----+---------+--------+  Radial                  triphasic           +--------+--------+-----+---------+--------+  Ulnar                   triphasic           +--------+--------+-----+---------+--------+  Left Doppler Findings: +--------+--------+-----+---------+--------+  Site     Pressure Index Doppler   Comments  +--------+--------+-----+---------+--------+  Brachial                triphasic           +--------+--------+-----+---------+--------+  Radial                  triphasic           +--------+--------+-----+---------+--------+  Ulnar                   triphasic           +--------+--------+-----+---------+--------+  Summary: Right Carotid: The extracranial vessels were near-normal with only minimal wall                thickening or plaque. Left Carotid: The extracranial vessels were  near-normal with only minimal wall               thickening or plaque. Vertebrals:  Bilateral vertebral arteries demonstrate antegrade flow. Subclavians: Normal flow hemodynamics were seen in bilateral subclavian              arteries. Right Upper Extremity: Doppler waveforms remain within normal limits with right radial compression. Doppler waveforms remain within normal limits with right ulnar compression. Left Upper Extremity: Doppler waveforms decrease >50% with left radial compression. Doppler waveforms remain within normal limits with left ulnar compression.  Electronically signed by Lemar LivingsBrandon Cain MD on 03/28/2021 at 7:38:28 PM.    Final     Cardiac Studies   LHC 03/28/21:   Prox RCA to Dist RCA lesion is 40% stenosed.   Dist RCA lesion is 99% stenosed.   Mid LM to Ost LAD lesion is 99% stenosed with 99% stenosed side branch in Ost Cx.   Severe, heavily calcified distal left main stenosis The LAD and Circumflex are relatively disease free beyond the ostial segments and appear to be good targets for bypass The RCA is heavily calcified with moderate mid stenosis. Severe distal RCA stenosis Normal LV filling pressure   Recommendations: [facility]. Will resume IV heparin 4 hours post sheath pull. CT surgery consult for CABG. Continue ASA, statin and beta blocker.   Echo 03/28/21: 1. Left ventricular ejection fraction, by estimation, is 60 to 65%. The  left ventricle has normal function. Left ventricular endocardial border  not optimally defined to evaluate regional wall motion. Left ventricular  diastolic parameters are consistent  with Grade I diastolic dysfunction (impaired relaxation).   2. Right ventricular systolic function is normal. The right ventricular  size is normal.   3. The mitral valve is normal in structure. No evidence of mitral valve  regurgitation.   4. The aortic valve is grossly normal. Aortic valve regurgitation is not  visualized.   5. Not  well visualized.     Patient Profile     70 y.o. male with hypertension, hyperlipidemia who  presents with NSTEMI and found to have severe left main disease  Assessment & Plan    NSTEMI: Presented with exertional chest pain over the last 2 weeks.  Mild troponin elevation.  Initial EKG suggestive of global ischemia with aVR elevation and diffuse ST depressions, improved on subsequent EKG.  Echocardiogram 12/12 showed normal biventricular function, no significant valvular disease.  Cath on 12/12 showed 99% mid left main to ostial LAD and ostial LCx stenosis, 99% distal RCA stenosis. -Seen by cardiac surgery, planning CABG tomorrow -Continue aspirin 81 mg daily, heparin drip -Continue atorvastatin 80 mg daily -Continue lisinopril 20 mg daily -Not on beta-blocker due to low resting heart rate  Hypertension: On lisinopril-HCTZ 20-12.5 mg daily and amlodipine 5 mg daily at home. -Holding HCTZ, continue lisinopril and amlodipine  Hyperlipidemia: LDL 77 on simvastatin 40 mg daily -Switched to atorvastatin 80 mg daily   -Diet heart healthy -DVT PPx: heparin gtt -Code: Full   For questions or updates, please contact [facility] Please consult [email or web address] for contact info under        Signed, Donato Heinz, MD  03/29/2021, 10:11 AM

## 2021-03-30 ENCOUNTER — Inpatient Hospital Stay (HOSPITAL_COMMUNITY): Payer: BC Managed Care – PPO

## 2021-03-30 ENCOUNTER — Inpatient Hospital Stay (HOSPITAL_COMMUNITY)
Admission: EM | Disposition: A | Discharge status: Home / Self Care | Payer: Self-pay | Source: Home / Self Care | Attending: Thoracic Surgery (Cardiothoracic Vascular Surgery)

## 2021-03-30 ENCOUNTER — Inpatient Hospital Stay (HOSPITAL_COMMUNITY): Payer: BC Managed Care – PPO | Admitting: Certified Registered Nurse Anesthetist

## 2021-03-30 ENCOUNTER — Encounter (HOSPITAL_COMMUNITY): Payer: Self-pay | Admitting: Cardiology

## 2021-03-30 DIAGNOSIS — I214 Non-ST elevation (NSTEMI) myocardial infarction: Secondary | ICD-10-CM

## 2021-03-30 DIAGNOSIS — I25110 Atherosclerotic heart disease of native coronary artery with unstable angina pectoris: Secondary | ICD-10-CM

## 2021-03-30 DIAGNOSIS — Z951 Presence of aortocoronary bypass graft: Secondary | ICD-10-CM

## 2021-03-30 DIAGNOSIS — J9601 Acute respiratory failure with hypoxia: Secondary | ICD-10-CM

## 2021-03-30 HISTORY — PX: CORONARY ARTERY BYPASS GRAFT: SHX141

## 2021-03-30 HISTORY — PX: TEE WITHOUT CARDIOVERSION: SHX5443

## 2021-03-30 HISTORY — PX: ENDOVEIN HARVEST OF GREATER SAPHENOUS VEIN: SHX5059

## 2021-03-30 LAB — POCT I-STAT 7, (LYTES, BLD GAS, ICA,H+H)
Acid-Base Excess: 1 mmol/L (ref 0.0–2.0)
Acid-Base Excess: 1 mmol/L (ref 0.0–2.0)
Acid-base deficit: 3 mmol/L — ABNORMAL HIGH (ref 0.0–2.0)
Acid-base deficit: 1 mmol/L (ref 0.0–2.0)
Acid-base deficit: 3 mmol/L — ABNORMAL HIGH (ref 0.0–2.0)
Acid-base deficit: 9 mmol/L — ABNORMAL HIGH (ref 0.0–2.0)
Acid-base deficit: 9 mmol/L — ABNORMAL HIGH (ref 0.0–2.0)
Acid-base deficit: 7 mmol/L — ABNORMAL HIGH (ref 0.0–2.0)
Acid-base deficit: 6 mmol/L — ABNORMAL HIGH (ref 0.0–2.0)
Acid-base deficit: 5 mmol/L — ABNORMAL HIGH (ref 0.0–2.0)
Bicarbonate: 22.6 mmol/L (ref 20.0–28.0)
Bicarbonate: 25.5 mmol/L (ref 20.0–28.0)
Bicarbonate: 26.5 mmol/L (ref 20.0–28.0)
Bicarbonate: 22 mmol/L (ref 20.0–28.0)
Bicarbonate: 23.6 mmol/L (ref 20.0–28.0)
Bicarbonate: 17.1 mmol/L — ABNORMAL LOW (ref 20.0–28.0)
Bicarbonate: 16.7 mmol/L — ABNORMAL LOW (ref 20.0–28.0)
Bicarbonate: 18.2 mmol/L — ABNORMAL LOW (ref 20.0–28.0)
Bicarbonate: 19 mmol/L — ABNORMAL LOW (ref 20.0–28.0)
Bicarbonate: 19.4 mmol/L — ABNORMAL LOW (ref 20.0–28.0)
Calcium, Ion: 1.23 mmol/L (ref 1.15–1.40)
Calcium, Ion: 1.08 mmol/L — ABNORMAL LOW (ref 1.15–1.40)
Calcium, Ion: 1.09 mmol/L — ABNORMAL LOW (ref 1.15–1.40)
Calcium, Ion: 1.06 mmol/L — ABNORMAL LOW (ref 1.15–1.40)
Calcium, Ion: 1.27 mmol/L (ref 1.15–1.40)
Calcium, Ion: 1.08 mmol/L — ABNORMAL LOW (ref 1.15–1.40)
Calcium, Ion: 0.96 mmol/L — ABNORMAL LOW (ref 1.15–1.40)
Calcium, Ion: 1.09 mmol/L — ABNORMAL LOW (ref 1.15–1.40)
Calcium, Ion: 1.11 mmol/L — ABNORMAL LOW (ref 1.15–1.40)
Calcium, Ion: 1.09 mmol/L — ABNORMAL LOW (ref 1.15–1.40)
HCT: 39 % (ref 39.0–52.0)
HCT: 29 % — ABNORMAL LOW (ref 39.0–52.0)
HCT: 32 % — ABNORMAL LOW (ref 39.0–52.0)
HCT: 29 % — ABNORMAL LOW (ref 39.0–52.0)
HCT: 31 % — ABNORMAL LOW (ref 39.0–52.0)
HCT: 27 % — ABNORMAL LOW (ref 39.0–52.0)
HCT: 21 % — ABNORMAL LOW (ref 39.0–52.0)
HCT: 28 % — ABNORMAL LOW (ref 39.0–52.0)
HCT: 29 % — ABNORMAL LOW (ref 39.0–52.0)
HCT: 30 % — ABNORMAL LOW (ref 39.0–52.0)
Hemoglobin: 13.3 g/dL (ref 13.0–17.0)
Hemoglobin: 10.9 g/dL — ABNORMAL LOW (ref 13.0–17.0)
Hemoglobin: 9.9 g/dL — ABNORMAL LOW (ref 13.0–17.0)
Hemoglobin: 10.5 g/dL — ABNORMAL LOW (ref 13.0–17.0)
Hemoglobin: 9.9 g/dL — ABNORMAL LOW (ref 13.0–17.0)
Hemoglobin: 9.2 g/dL — ABNORMAL LOW (ref 13.0–17.0)
Hemoglobin: 7.1 g/dL — ABNORMAL LOW (ref 13.0–17.0)
Hemoglobin: 9.5 g/dL — ABNORMAL LOW (ref 13.0–17.0)
Hemoglobin: 9.9 g/dL — ABNORMAL LOW (ref 13.0–17.0)
Hemoglobin: 10.2 g/dL — ABNORMAL LOW (ref 13.0–17.0)
O2 Saturation: 100 %
O2 Saturation: 100 %
O2 Saturation: 100 %
O2 Saturation: 100 %
O2 Saturation: 99 %
O2 Saturation: 97 %
O2 Saturation: 96 %
O2 Saturation: 94 %
O2 Saturation: 97 %
O2 Saturation: 96 %
Patient temperature: 36.2
Patient temperature: 36.2
Patient temperature: 37.2
Patient temperature: 37.5
Patient temperature: 37.9
Potassium: 3.6 mmol/L (ref 3.5–5.1)
Potassium: 4.2 mmol/L (ref 3.5–5.1)
Potassium: 5.4 mmol/L — ABNORMAL HIGH (ref 3.5–5.1)
Potassium: 4.7 mmol/L (ref 3.5–5.1)
Potassium: 5.4 mmol/L — ABNORMAL HIGH (ref 3.5–5.1)
Potassium: 3.5 mmol/L (ref 3.5–5.1)
Potassium: 3 mmol/L — ABNORMAL LOW (ref 3.5–5.1)
Potassium: 3.9 mmol/L (ref 3.5–5.1)
Potassium: 3.9 mmol/L (ref 3.5–5.1)
Potassium: 4 mmol/L (ref 3.5–5.1)
Sodium: 139 mmol/L (ref 135–145)
Sodium: 134 mmol/L — ABNORMAL LOW (ref 135–145)
Sodium: 137 mmol/L (ref 135–145)
Sodium: 133 mmol/L — ABNORMAL LOW (ref 135–145)
Sodium: 138 mmol/L (ref 135–145)
Sodium: 143 mmol/L (ref 135–145)
Sodium: 145 mmol/L (ref 135–145)
Sodium: 141 mmol/L (ref 135–145)
Sodium: 142 mmol/L (ref 135–145)
Sodium: 141 mmol/L (ref 135–145)
TCO2: 24 mmol/L (ref 22–32)
TCO2: 27 mmol/L (ref 22–32)
TCO2: 28 mmol/L (ref 22–32)
TCO2: 23 mmol/L (ref 22–32)
TCO2: 25 mmol/L (ref 22–32)
TCO2: 18 mmol/L — ABNORMAL LOW (ref 22–32)
TCO2: 18 mmol/L — ABNORMAL LOW (ref 22–32)
TCO2: 19 mmol/L — ABNORMAL LOW (ref 22–32)
TCO2: 20 mmol/L — ABNORMAL LOW (ref 22–32)
TCO2: 20 mmol/L — ABNORMAL LOW (ref 22–32)
pCO2 arterial: 43.8 mmHg (ref 32.0–48.0)
pCO2 arterial: 39.7 mmHg (ref 32.0–48.0)
pCO2 arterial: 45.8 mmHg (ref 32.0–48.0)
pCO2 arterial: 38.2 mmHg (ref 32.0–48.0)
pCO2 arterial: 40.8 mmHg (ref 32.0–48.0)
pCO2 arterial: 35.6 mmHg (ref 32.0–48.0)
pCO2 arterial: 32 mmHg (ref 32.0–48.0)
pCO2 arterial: 35.8 mmHg (ref 32.0–48.0)
pCO2 arterial: 35.6 mmHg (ref 32.0–48.0)
pCO2 arterial: 34.9 mmHg (ref 32.0–48.0)
pH, Arterial: 7.322 — ABNORMAL LOW (ref 7.350–7.450)
pH, Arterial: 7.371 (ref 7.350–7.450)
pH, Arterial: 7.416 (ref 7.350–7.450)
pH, Arterial: 7.341 — ABNORMAL LOW (ref 7.350–7.450)
pH, Arterial: 7.399 (ref 7.350–7.450)
pH, Arterial: 7.286 — ABNORMAL LOW (ref 7.350–7.450)
pH, Arterial: 7.322 — ABNORMAL LOW (ref 7.350–7.450)
pH, Arterial: 7.315 — ABNORMAL LOW (ref 7.350–7.450)
pH, Arterial: 7.337 — ABNORMAL LOW (ref 7.350–7.450)
pH, Arterial: 7.356 (ref 7.350–7.450)
pO2, Arterial: 181 mmHg — ABNORMAL HIGH (ref 83.0–108.0)
pO2, Arterial: 272 mmHg — ABNORMAL HIGH (ref 83.0–108.0)
pO2, Arterial: 308 mmHg — ABNORMAL HIGH (ref 83.0–108.0)
pO2, Arterial: 130 mmHg — ABNORMAL HIGH (ref 83.0–108.0)
pO2, Arterial: 188 mmHg — ABNORMAL HIGH (ref 83.0–108.0)
pO2, Arterial: 94 mmHg (ref 83.0–108.0)
pO2, Arterial: 85 mmHg (ref 83.0–108.0)
pO2, Arterial: 77 mmHg — ABNORMAL LOW (ref 83.0–108.0)
pO2, Arterial: 100 mmHg (ref 83.0–108.0)
pO2, Arterial: 88 mmHg (ref 83.0–108.0)

## 2021-03-30 LAB — POCT I-STAT, CHEM 8
BUN: 11 mg/dL (ref 8–23)
BUN: 11 mg/dL (ref 8–23)
BUN: 12 mg/dL (ref 8–23)
BUN: 11 mg/dL (ref 8–23)
Calcium, Ion: 1.22 mmol/L (ref 1.15–1.40)
Calcium, Ion: 1.13 mmol/L — ABNORMAL LOW (ref 1.15–1.40)
Calcium, Ion: 1.03 mmol/L — ABNORMAL LOW (ref 1.15–1.40)
Calcium, Ion: 1.26 mmol/L (ref 1.15–1.40)
Chloride: 105 mmol/L (ref 98–111)
Chloride: 106 mmol/L (ref 98–111)
Chloride: 103 mmol/L (ref 98–111)
Chloride: 106 mmol/L (ref 98–111)
Creatinine, Ser: 0.7 mg/dL (ref 0.61–1.24)
Creatinine, Ser: 0.7 mg/dL (ref 0.61–1.24)
Creatinine, Ser: 0.7 mg/dL (ref 0.61–1.24)
Creatinine, Ser: 0.7 mg/dL (ref 0.61–1.24)
Glucose, Bld: 99 mg/dL (ref 70–99)
Glucose, Bld: 102 mg/dL — ABNORMAL HIGH (ref 70–99)
Glucose, Bld: 93 mg/dL (ref 70–99)
Glucose, Bld: 96 mg/dL (ref 70–99)
HCT: 38 % — ABNORMAL LOW (ref 39.0–52.0)
HCT: 36 % — ABNORMAL LOW (ref 39.0–52.0)
HCT: 27 % — ABNORMAL LOW (ref 39.0–52.0)
HCT: 27 % — ABNORMAL LOW (ref 39.0–52.0)
Hemoglobin: 12.9 g/dL — ABNORMAL LOW (ref 13.0–17.0)
Hemoglobin: 12.2 g/dL — ABNORMAL LOW (ref 13.0–17.0)
Hemoglobin: 9.2 g/dL — ABNORMAL LOW (ref 13.0–17.0)
Hemoglobin: 9.2 g/dL — ABNORMAL LOW (ref 13.0–17.0)
Potassium: 3.6 mmol/L (ref 3.5–5.1)
Potassium: 4 mmol/L (ref 3.5–5.1)
Potassium: 5.4 mmol/L — ABNORMAL HIGH (ref 3.5–5.1)
Potassium: 4.7 mmol/L (ref 3.5–5.1)
Sodium: 140 mmol/L (ref 135–145)
Sodium: 137 mmol/L (ref 135–145)
Sodium: 133 mmol/L — ABNORMAL LOW (ref 135–145)
Sodium: 137 mmol/L (ref 135–145)
TCO2: 22 mmol/L (ref 22–32)
TCO2: 23 mmol/L (ref 22–32)
TCO2: 25 mmol/L (ref 22–32)
TCO2: 23 mmol/L (ref 22–32)

## 2021-03-30 LAB — ECHO INTRAOPERATIVE TEE
AR max vel: 2.55 cm2
AV Area VTI: 2.52 cm2
AV Area mean vel: 2.11 cm2
AV Mean grad: 3 mmHg
AV Peak grad: 4.9 mmHg
Ao pk vel: 1.11 m/s
Area-P 1/2: 4.89 cm2
Height: 72 in
Weight: 3245.17 oz

## 2021-03-30 LAB — CBC
HCT: 42.8 % (ref 39.0–52.0)
HCT: 33.2 % — ABNORMAL LOW (ref 39.0–52.0)
HCT: 30.5 % — ABNORMAL LOW (ref 39.0–52.0)
Hemoglobin: 15.3 g/dL (ref 13.0–17.0)
Hemoglobin: 11.5 g/dL — ABNORMAL LOW (ref 13.0–17.0)
Hemoglobin: 10.4 g/dL — ABNORMAL LOW (ref 13.0–17.0)
MCH: 33.3 pg (ref 26.0–34.0)
MCH: 33 pg (ref 26.0–34.0)
MCH: 32.8 pg (ref 26.0–34.0)
MCHC: 35.7 g/dL (ref 30.0–36.0)
MCHC: 34.6 g/dL (ref 30.0–36.0)
MCHC: 34.1 g/dL (ref 30.0–36.0)
MCV: 93.2 fL (ref 80.0–100.0)
MCV: 95.4 fL (ref 80.0–100.0)
MCV: 96.2 fL (ref 80.0–100.0)
Platelets: 256 10*3/uL (ref 150–400)
Platelets: 172 10*3/uL (ref 150–400)
Platelets: 183 10*3/uL (ref 150–400)
RBC: 4.59 MIL/uL (ref 4.22–5.81)
RBC: 3.48 MIL/uL — ABNORMAL LOW (ref 4.22–5.81)
RBC: 3.17 MIL/uL — ABNORMAL LOW (ref 4.22–5.81)
RDW: 12.5 % (ref 11.5–15.5)
RDW: 12.5 % (ref 11.5–15.5)
RDW: 12.5 % (ref 11.5–15.5)
WBC: 4.1 10*3/uL (ref 4.0–10.5)
WBC: 7.3 10*3/uL (ref 4.0–10.5)
WBC: 9.2 10*3/uL (ref 4.0–10.5)
nRBC: 0 % (ref 0.0–0.2)
nRBC: 0 % (ref 0.0–0.2)
nRBC: 0 % (ref 0.0–0.2)

## 2021-03-30 LAB — POCT I-STAT EG7
Acid-base deficit: 1 mmol/L (ref 0.0–2.0)
Bicarbonate: 25.5 mmol/L (ref 20.0–28.0)
Calcium, Ion: 1.14 mmol/L — ABNORMAL LOW (ref 1.15–1.40)
HCT: 31 % — ABNORMAL LOW (ref 39.0–52.0)
Hemoglobin: 10.5 g/dL — ABNORMAL LOW (ref 13.0–17.0)
O2 Saturation: 80 %
Potassium: 5.5 mmol/L — ABNORMAL HIGH (ref 3.5–5.1)
Sodium: 134 mmol/L — ABNORMAL LOW (ref 135–145)
TCO2: 27 mmol/L (ref 22–32)
pCO2, Ven: 48.3 mmHg (ref 44.0–60.0)
pH, Ven: 7.331 (ref 7.250–7.430)
pO2, Ven: 48 mmHg — ABNORMAL HIGH (ref 32.0–45.0)

## 2021-03-30 LAB — GLUCOSE, CAPILLARY
Glucose-Capillary: 84 mg/dL (ref 70–99)
Glucose-Capillary: 106 mg/dL — ABNORMAL HIGH (ref 70–99)
Glucose-Capillary: 84 mg/dL (ref 70–99)
Glucose-Capillary: 86 mg/dL (ref 70–99)
Glucose-Capillary: 146 mg/dL — ABNORMAL HIGH (ref 70–99)

## 2021-03-30 LAB — MAGNESIUM: Magnesium: 2.7 mg/dL — ABNORMAL HIGH (ref 1.7–2.4)

## 2021-03-30 LAB — BASIC METABOLIC PANEL
Anion gap: 6 (ref 5–15)
Anion gap: 7 (ref 5–15)
BUN: 12 mg/dL (ref 8–23)
BUN: 10 mg/dL (ref 8–23)
CO2: 22 mmol/L (ref 22–32)
CO2: 21 mmol/L — ABNORMAL LOW (ref 22–32)
Calcium: 8.9 mg/dL (ref 8.9–10.3)
Calcium: 7.9 mg/dL — ABNORMAL LOW (ref 8.9–10.3)
Chloride: 103 mmol/L (ref 98–111)
Chloride: 107 mmol/L (ref 98–111)
Creatinine, Ser: 0.87 mg/dL (ref 0.61–1.24)
Creatinine, Ser: 0.92 mg/dL (ref 0.61–1.24)
GFR, Estimated: >60 mL/min (ref >60)
GFR, Estimated: >60 mL/min (ref >60)
Glucose, Bld: 104 mg/dL — ABNORMAL HIGH (ref 70–99)
Glucose, Bld: 167 mg/dL — ABNORMAL HIGH (ref 70–99)
Potassium: 3.8 mmol/L (ref 3.5–5.1)
Potassium: 4.3 mmol/L (ref 3.5–5.1)
Sodium: 131 mmol/L — ABNORMAL LOW (ref 135–145)
Sodium: 135 mmol/L (ref 135–145)

## 2021-03-30 LAB — PROTIME-INR
INR: 1.1 (ref 0.8–1.2)
INR: 1.4 — ABNORMAL HIGH (ref 0.8–1.2)
Prothrombin Time: 14.3 seconds (ref 11.4–15.2)
Prothrombin Time: 17.2 seconds — ABNORMAL HIGH (ref 11.4–15.2)

## 2021-03-30 LAB — HEMOGLOBIN A1C
Hgb A1c MFr Bld: 5.4 % (ref 4.8–5.6)
Mean Plasma Glucose: 108.28 mg/dL

## 2021-03-30 LAB — APTT
aPTT: 62 seconds — ABNORMAL HIGH (ref 24–36)
aPTT: 31 seconds (ref 24–36)

## 2021-03-30 LAB — HEMOGLOBIN AND HEMATOCRIT, BLOOD
HCT: 31.3 % — ABNORMAL LOW (ref 39.0–52.0)
Hemoglobin: 11 g/dL — ABNORMAL LOW (ref 13.0–17.0)

## 2021-03-30 LAB — PLATELET COUNT: Platelets: 195 10*3/uL (ref 150–400)

## 2021-03-30 LAB — HEPARIN LEVEL (UNFRACTIONATED): Heparin Unfractionated: 0.45 IU/mL (ref 0.30–0.70)

## 2021-03-30 SURGERY — CORONARY ARTERY BYPASS GRAFTING (CABG)
Anesthesia: General | Site: Leg Upper | Laterality: Right

## 2021-03-30 MED ORDER — ROCURONIUM BROMIDE 10 MG/ML (PF) SYRINGE
PREFILLED_SYRINGE | INTRAVENOUS | Status: AC
  Filled 2021-03-30: qty 20

## 2021-03-30 MED ORDER — MIDAZOLAM HCL (PF) 5 MG/ML IJ SOLN
INTRAMUSCULAR | Status: DC | PRN
  Administered 2021-03-30 (×4): 2 mg via INTRAVENOUS

## 2021-03-30 MED ORDER — ALBUMIN HUMAN 5 % IV SOLN: INTRAVENOUS | Status: DC | PRN

## 2021-03-30 MED ORDER — ROCURONIUM BROMIDE 10 MG/ML (PF) SYRINGE
PREFILLED_SYRINGE | INTRAVENOUS | Status: DC | PRN
Start: 2021-03-30 — End: 2021-03-30
  Administered 2021-03-30: 30 mg via INTRAVENOUS
  Administered 2021-03-30: 70 mg via INTRAVENOUS
  Administered 2021-03-30 (×2): 50 mg via INTRAVENOUS

## 2021-03-30 MED ORDER — MIDAZOLAM HCL (PF) 10 MG/2ML IJ SOLN
INTRAMUSCULAR | Status: AC
  Filled 2021-03-30: qty 2

## 2021-03-30 MED ORDER — PHENYLEPHRINE HCL-NACL 20-0.9 MG/250ML-% IV SOLN: 0.0000 ug/min | INTRAVENOUS | Status: DC

## 2021-03-30 MED ORDER — CEFAZOLIN SODIUM-DEXTROSE 2-4 GM/100ML-% IV SOLN
2.0000 g | Freq: Three times a day (TID) | INTRAVENOUS | Status: AC
  Administered 2021-03-30 – 2021-04-01 (×6): 2 g via INTRAVENOUS
  Filled 2021-03-30 (×6): qty 100

## 2021-03-30 MED ORDER — ONDANSETRON HCL 4 MG/2ML IJ SOLN
4.0000 mg | Freq: Four times a day (QID) | INTRAMUSCULAR | Status: DC | PRN
  Administered 2021-03-30 – 2021-03-31 (×3): 4 mg via INTRAVENOUS
  Filled 2021-03-30 (×3): qty 2

## 2021-03-30 MED ORDER — FENTANYL CITRATE (PF) 250 MCG/5ML IJ SOLN
INTRAMUSCULAR | Status: AC
  Filled 2021-03-30: qty 5

## 2021-03-30 MED ORDER — CHLORHEXIDINE GLUCONATE 0.12% ORAL RINSE (MEDLINE KIT)
15.0000 mL | Freq: Two times a day (BID) | OROMUCOSAL | Status: DC
  Administered 2021-03-30 – 2021-04-01 (×2): 15 mL via OROMUCOSAL

## 2021-03-30 MED ORDER — METOPROLOL TARTRATE 25 MG/10 ML ORAL SUSPENSION: 12.5000 mg | Freq: Two times a day (BID) | ORAL | Status: DC

## 2021-03-30 MED ORDER — MIDAZOLAM HCL 2 MG/2ML IJ SOLN: 2.0000 mg | INTRAMUSCULAR | Status: DC | PRN

## 2021-03-30 MED ORDER — NOREPINEPHRINE 4 MG/250ML-% IV SOLN
0.0000 ug/min | INTRAVENOUS | Status: DC
  Administered 2021-03-30: 18:00:00 10 ug/min via INTRAVENOUS
  Administered 2021-03-31: 6 ug/min via INTRAVENOUS
  Filled 2021-03-30 (×2): qty 250

## 2021-03-30 MED ORDER — ACETAMINOPHEN 500 MG PO TABS
1000.0000 mg | ORAL_TABLET | Freq: Four times a day (QID) | ORAL | Status: DC
  Administered 2021-03-31 – 2021-04-03 (×12): 1000 mg via ORAL
  Filled 2021-03-30 (×13): qty 2

## 2021-03-30 MED ORDER — SODIUM CHLORIDE 0.9% FLUSH
10.0000 mL | Freq: Two times a day (BID) | INTRAVENOUS | Status: DC
  Administered 2021-03-30: 15:00:00 40 mL
  Administered 2021-03-30 – 2021-03-31 (×3): 10 mL
  Administered 2021-04-01: 40 mL
  Administered 2021-04-01: 10 mL

## 2021-03-30 MED ORDER — SODIUM BICARBONATE 8.4 % IV SOLN
50.0000 meq | Freq: Once | INTRAVENOUS | Status: AC
  Administered 2021-03-30: 14:00:00 50 meq via INTRAVENOUS

## 2021-03-30 MED ORDER — LACTATED RINGERS IV SOLN: INTRAVENOUS | Status: DC | PRN

## 2021-03-30 MED ORDER — OXYCODONE HCL 5 MG PO TABS
5.0000 mg | ORAL_TABLET | ORAL | Status: DC | PRN
  Administered 2021-03-30 – 2021-03-31 (×3): 10 mg via ORAL
  Filled 2021-03-30 (×3): qty 2

## 2021-03-30 MED ORDER — TRAMADOL HCL 50 MG PO TABS
50.0000 mg | ORAL_TABLET | ORAL | Status: DC | PRN
  Administered 2021-03-31 (×2): 100 mg via ORAL
  Administered 2021-04-01 (×2): 50 mg via ORAL
  Filled 2021-03-30 (×2): qty 1
  Filled 2021-03-30 (×2): qty 2

## 2021-03-30 MED ORDER — METOPROLOL TARTRATE 5 MG/5ML IV SOLN: 2.5000 mg | INTRAVENOUS | Status: DC | PRN

## 2021-03-30 MED ORDER — BISACODYL 10 MG RE SUPP: 10.0000 mg | Freq: Every day | RECTAL | Status: DC

## 2021-03-30 MED ORDER — POTASSIUM CHLORIDE 10 MEQ/50ML IV SOLN
10.0000 meq | INTRAVENOUS | Status: AC
  Administered 2021-03-30 (×3): 10 meq via INTRAVENOUS

## 2021-03-30 MED ORDER — CHLORHEXIDINE GLUCONATE 0.12 % MT SOLN
15.0000 mL | OROMUCOSAL | Status: AC
  Administered 2021-03-30: 15:00:00 15 mL via OROMUCOSAL

## 2021-03-30 MED ORDER — PROTAMINE SULFATE 10 MG/ML IV SOLN
INTRAVENOUS | Status: DC | PRN
  Administered 2021-03-30: 300 mg via INTRAVENOUS

## 2021-03-30 MED ORDER — ALBUMIN HUMAN 5 % IV SOLN
12.5000 g | INTRAVENOUS | Status: AC | PRN
  Administered 2021-03-31 (×2): 12.5 g via INTRAVENOUS
  Filled 2021-03-30 (×2): qty 250

## 2021-03-30 MED ORDER — NICARDIPINE HCL IN NACL 20-0.86 MG/200ML-% IV SOLN
5.0000 mg/h | INTRAVENOUS | Status: DC
  Administered 2021-03-30: 17:00:00 5 mg/h via INTRAVENOUS
  Filled 2021-03-30: qty 200

## 2021-03-30 MED ORDER — MAGNESIUM SULFATE 4 GM/100ML IV SOLN
4.0000 g | Freq: Once | INTRAVENOUS | Status: DC
  Filled 2021-03-30: qty 100

## 2021-03-30 MED ORDER — PROTAMINE SULFATE 10 MG/ML IV SOLN
INTRAVENOUS | Status: AC
  Filled 2021-03-30: qty 25

## 2021-03-30 MED ORDER — VANCOMYCIN HCL IN DEXTROSE 1-5 GM/200ML-% IV SOLN
1000.0000 mg | Freq: Once | INTRAVENOUS | Status: AC
  Administered 2021-03-30: 21:00:00 1000 mg via INTRAVENOUS
  Filled 2021-03-30: qty 200

## 2021-03-30 MED ORDER — ASPIRIN EC 325 MG PO TBEC
325.0000 mg | DELAYED_RELEASE_TABLET | Freq: Every day | ORAL | Status: DC
  Administered 2021-03-31: 325 mg via ORAL
  Filled 2021-03-30: qty 1

## 2021-03-30 MED ORDER — SODIUM CHLORIDE 0.9 % IV SOLN: INTRAVENOUS | Status: DC

## 2021-03-30 MED ORDER — EPHEDRINE 5 MG/ML INJ
INTRAVENOUS | Status: AC
  Filled 2021-03-30: qty 5

## 2021-03-30 MED ORDER — FAMOTIDINE IN NACL 20-0.9 MG/50ML-% IV SOLN
20.0000 mg | Freq: Two times a day (BID) | INTRAVENOUS | Status: DC
  Administered 2021-03-30: 14:00:00 20 mg via INTRAVENOUS
  Filled 2021-03-30: qty 50

## 2021-03-30 MED ORDER — ACETAMINOPHEN 160 MG/5ML PO SOLN: 650.0000 mg | Freq: Once | ORAL | Status: AC

## 2021-03-30 MED ORDER — DEXMEDETOMIDINE HCL IN NACL 400 MCG/100ML IV SOLN
0.0000 ug/kg/h | INTRAVENOUS | Status: DC
  Filled 2021-03-30: qty 100

## 2021-03-30 MED ORDER — SODIUM CHLORIDE 0.9 % IV SOLN: INTRAVENOUS | Status: DC | PRN

## 2021-03-30 MED ORDER — MORPHINE SULFATE (PF) 2 MG/ML IV SOLN
1.0000 mg | INTRAVENOUS | Status: DC | PRN
  Administered 2021-03-30 – 2021-03-31 (×5): 2 mg via INTRAVENOUS
  Filled 2021-03-30 (×5): qty 1
  Filled 2021-03-30: qty 2

## 2021-03-30 MED ORDER — PHENYLEPHRINE 40 MCG/ML (10ML) SYRINGE FOR IV PUSH (FOR BLOOD PRESSURE SUPPORT)
PREFILLED_SYRINGE | INTRAVENOUS | Status: DC | PRN
  Administered 2021-03-30: 80 ug via INTRAVENOUS
  Administered 2021-03-30: 40 ug via INTRAVENOUS
  Administered 2021-03-30 (×2): 80 ug via INTRAVENOUS
  Administered 2021-03-30: 40 ug via INTRAVENOUS

## 2021-03-30 MED ORDER — 0.9 % SODIUM CHLORIDE (POUR BTL) OPTIME
TOPICAL | Status: DC | PRN
  Administered 2021-03-30: 10:00:00 5000 mL

## 2021-03-30 MED ORDER — SODIUM CHLORIDE 0.9 % IV SOLN: 250.0000 mL | INTRAVENOUS | Status: DC

## 2021-03-30 MED ORDER — ACETAMINOPHEN 650 MG RE SUPP
650.0000 mg | Freq: Once | RECTAL | Status: AC
  Administered 2021-03-30: 15:00:00 650 mg via RECTAL

## 2021-03-30 MED ORDER — ASPIRIN 81 MG PO CHEW: 324.0000 mg | CHEWABLE_TABLET | Freq: Every day | ORAL | Status: DC

## 2021-03-30 MED ORDER — PROTAMINE SULFATE 10 MG/ML IV SOLN
INTRAVENOUS | Status: AC
  Filled 2021-03-30: qty 5

## 2021-03-30 MED ORDER — SODIUM CHLORIDE 0.45 % IV SOLN: INTRAVENOUS | Status: DC | PRN

## 2021-03-30 MED ORDER — PROPOFOL 10 MG/ML IV BOLUS
INTRAVENOUS | Status: AC
  Filled 2021-03-30: qty 20

## 2021-03-30 MED ORDER — SODIUM CHLORIDE 0.9% FLUSH
3.0000 mL | Freq: Two times a day (BID) | INTRAVENOUS | Status: DC
  Administered 2021-03-31 – 2021-04-02 (×5): 3 mL via INTRAVENOUS

## 2021-03-30 MED ORDER — PHENYLEPHRINE 40 MCG/ML (10ML) SYRINGE FOR IV PUSH (FOR BLOOD PRESSURE SUPPORT)
PREFILLED_SYRINGE | INTRAVENOUS | Status: AC
  Filled 2021-03-30: qty 10

## 2021-03-30 MED ORDER — DOBUTAMINE IN D5W 4-5 MG/ML-% IV SOLN: 2.5000 ug/kg/min | INTRAVENOUS | Status: DC

## 2021-03-30 MED ORDER — LACTATED RINGERS IV SOLN: INTRAVENOUS | Status: DC

## 2021-03-30 MED ORDER — ORAL CARE MOUTH RINSE: 15.0000 mL | OROMUCOSAL | Status: DC

## 2021-03-30 MED ORDER — PANTOPRAZOLE SODIUM 40 MG PO TBEC
40.0000 mg | DELAYED_RELEASE_TABLET | Freq: Every day | ORAL | Status: DC
  Administered 2021-04-01 – 2021-04-03 (×3): 40 mg via ORAL
  Filled 2021-03-30 (×3): qty 1

## 2021-03-30 MED ORDER — HEPARIN SODIUM (PORCINE) 1000 UNIT/ML IJ SOLN
INTRAMUSCULAR | Status: DC | PRN
  Administered 2021-03-30: 30000 [IU] via INTRAVENOUS

## 2021-03-30 MED ORDER — INSULIN REGULAR(HUMAN) IN NACL 100-0.9 UT/100ML-% IV SOLN: INTRAVENOUS | Status: DC

## 2021-03-30 MED ORDER — EPHEDRINE SULFATE-NACL 50-0.9 MG/10ML-% IV SOSY
PREFILLED_SYRINGE | INTRAVENOUS | Status: DC | PRN
  Administered 2021-03-30 (×5): 5 mg via INTRAVENOUS

## 2021-03-30 MED ORDER — PROPOFOL 10 MG/ML IV BOLUS
INTRAVENOUS | Status: DC | PRN
  Administered 2021-03-30: 20 mg via INTRAVENOUS
  Administered 2021-03-30: 150 mg via INTRAVENOUS

## 2021-03-30 MED ORDER — BISACODYL 5 MG PO TBEC
10.0000 mg | DELAYED_RELEASE_TABLET | Freq: Every day | ORAL | Status: DC
  Administered 2021-03-31 – 2021-04-01 (×2): 10 mg via ORAL
  Filled 2021-03-30 (×4): qty 2

## 2021-03-30 MED ORDER — SODIUM CHLORIDE (PF) 0.9 % IJ SOLN
OROMUCOSAL | Status: DC | PRN
  Administered 2021-03-30 (×2): 4 mL via TOPICAL

## 2021-03-30 MED ORDER — PLASMA-LYTE A IV SOLN
INTRAVENOUS | Status: DC | PRN
  Administered 2021-03-30: 10:00:00 1000 mL via INTRAVASCULAR

## 2021-03-30 MED ORDER — ELUXADOLINE 100 MG PO TABS
100.0000 mg | ORAL_TABLET | Freq: Every day | ORAL | Status: DC
  Administered 2021-04-01 – 2021-04-03 (×3): 100 mg via ORAL
  Filled 2021-03-30 (×2): qty 1

## 2021-03-30 MED ORDER — LACTATED RINGERS IV SOLN: 500.0000 mL | Freq: Once | INTRAVENOUS | Status: DC | PRN

## 2021-03-30 MED ORDER — METOPROLOL TARTRATE 12.5 MG HALF TABLET: 12.5000 mg | ORAL_TABLET | Freq: Two times a day (BID) | ORAL | Status: DC

## 2021-03-30 MED ORDER — FENTANYL CITRATE (PF) 250 MCG/5ML IJ SOLN
INTRAMUSCULAR | Status: DC | PRN
  Administered 2021-03-30: 50 ug via INTRAVENOUS
  Administered 2021-03-30: 100 ug via INTRAVENOUS
  Administered 2021-03-30: 50 ug via INTRAVENOUS
  Administered 2021-03-30: 150 ug via INTRAVENOUS
  Administered 2021-03-30 (×2): 50 ug via INTRAVENOUS
  Administered 2021-03-30 (×2): 150 ug via INTRAVENOUS
  Administered 2021-03-30 (×2): 100 ug via INTRAVENOUS
  Administered 2021-03-30: 150 ug via INTRAVENOUS
  Administered 2021-03-30: 100 ug via INTRAVENOUS
  Administered 2021-03-30: 50 ug via INTRAVENOUS

## 2021-03-30 MED ORDER — DOCUSATE SODIUM 100 MG PO CAPS
200.0000 mg | ORAL_CAPSULE | Freq: Every day | ORAL | Status: DC
  Administered 2021-03-31 – 2021-04-03 (×4): 200 mg via ORAL
  Filled 2021-03-30 (×4): qty 2

## 2021-03-30 MED ORDER — ACETAMINOPHEN 160 MG/5ML PO SOLN: 1000.0000 mg | Freq: Four times a day (QID) | ORAL | Status: DC

## 2021-03-30 MED ORDER — ORAL CARE MOUTH RINSE
15.0000 mL | Freq: Two times a day (BID) | OROMUCOSAL | Status: DC
  Administered 2021-03-30 – 2021-04-03 (×6): 15 mL via OROMUCOSAL

## 2021-03-30 MED ORDER — INSULIN ASPART 100 UNIT/ML IJ SOLN
0.0000 [IU] | INTRAMUSCULAR | Status: DC
Start: 2021-03-30 — End: 2021-03-31
  Administered 2021-03-30 – 2021-03-31 (×3): 2 [IU] via SUBCUTANEOUS

## 2021-03-30 MED ORDER — SODIUM CHLORIDE 0.9% FLUSH: 3.0000 mL | INTRAVENOUS | Status: DC | PRN

## 2021-03-30 MED ORDER — SODIUM CHLORIDE 0.9% FLUSH: 10.0000 mL | INTRAVENOUS | Status: DC | PRN

## 2021-03-30 MED ORDER — ALBUMIN HUMAN 5 % IV SOLN
250.0000 mL | INTRAVENOUS | Status: AC | PRN
  Administered 2021-03-30 (×4): 12.5 g via INTRAVENOUS
  Filled 2021-03-30: qty 250

## 2021-03-30 MED ORDER — DEXTROSE 50 % IV SOLN: 0.0000 mL | INTRAVENOUS | Status: DC | PRN

## 2021-03-30 MED ORDER — CHLORHEXIDINE GLUCONATE CLOTH 2 % EX PADS
6.0000 | MEDICATED_PAD | Freq: Every day | CUTANEOUS | Status: DC
  Administered 2021-03-30 – 2021-04-02 (×4): 6 via TOPICAL

## 2021-03-30 MED FILL — Heparin Sodium (Porcine) Inj 1000 Unit/ML: Qty: 1000 | Status: AC

## 2021-03-30 MED FILL — Potassium Chloride Inj 2 mEq/ML: INTRAVENOUS | Qty: 40 | Status: AC

## 2021-03-30 MED FILL — Lidocaine HCl Local Preservative Free (PF) Inj 2%: INTRAMUSCULAR | Qty: 15 | Status: AC

## 2021-03-30 SURGICAL SUPPLY — 84 items
BAG DECANTER FOR FLEXI CONT (MISCELLANEOUS) ×4 IMPLANT
BLADE CLIPPER SURG (BLADE) ×4 IMPLANT
BLADE STERNUM SYSTEM 6 (BLADE) ×4 IMPLANT
BNDG ELASTIC 4X5.8 VLCR STR LF (GAUZE/BANDAGES/DRESSINGS) ×4 IMPLANT
BNDG ELASTIC 6X10 VLCR STRL LF (GAUZE/BANDAGES/DRESSINGS) ×1 IMPLANT
BNDG ELASTIC 6X5.8 VLCR STR LF (GAUZE/BANDAGES/DRESSINGS) ×4 IMPLANT
BNDG GAUZE ELAST 4 BULKY (GAUZE/BANDAGES/DRESSINGS) ×4 IMPLANT
CABLE SURGICAL S-101-97-12 (CABLE) ×3 IMPLANT
CANISTER SUCT 3000ML PPV (MISCELLANEOUS) ×4 IMPLANT
CANNULA MC2 2 STG 29/37 NON-V (CANNULA) ×3 IMPLANT
CANNULA MC2 TWO STAGE (CANNULA) ×1
CANNULA NON VENT 20FR 12 (CANNULA) ×4 IMPLANT
CATH ROBINSON RED A/P 18FR (CATHETERS) ×8 IMPLANT
CLIP RETRACTION 3.0MM CORONARY (MISCELLANEOUS) ×3 IMPLANT
CLIP VESOCCLUDE MED 24/CT (CLIP) IMPLANT
CLIP VESOCCLUDE SM WIDE 24/CT (CLIP) IMPLANT
CONN ST 1/2X1/2  BEN (MISCELLANEOUS) ×1
CONN ST 1/2X1/2 BEN (MISCELLANEOUS) ×3 IMPLANT
CONNECTOR BLAKE 2:1 CARIO BLK (MISCELLANEOUS) ×4 IMPLANT
CONTAINER PROTECT SURGISLUSH (MISCELLANEOUS) ×7 IMPLANT
DERMABOND ADVANCED (GAUZE/BANDAGES/DRESSINGS) ×1
DERMABOND ADVANCED .7 DNX12 (GAUZE/BANDAGES/DRESSINGS) IMPLANT
DRAIN CHANNEL 19F RND (DRAIN) ×11 IMPLANT
DRAPE CARDIOVASCULAR INCISE (DRAPES) ×1
DRAPE INCISE IOBAN 66X45 STRL (DRAPES) IMPLANT
DRAPE SRG 135X102X78XABS (DRAPES) ×3 IMPLANT
DRAPE WARM FLUID 44X44 (DRAPES) ×4 IMPLANT
DRESSING AQUACEL AG SP 3.5X10 (GAUZE/BANDAGES/DRESSINGS) IMPLANT
DRSG AQUACEL AG ADV 3.5X10 (GAUZE/BANDAGES/DRESSINGS) ×4 IMPLANT
DRSG AQUACEL AG SP 3.5X10 (GAUZE/BANDAGES/DRESSINGS) ×4
ELECT BLADE 4.0 EZ CLEAN MEGAD (MISCELLANEOUS) ×4
ELECT REM PT RETURN 9FT ADLT (ELECTROSURGICAL) ×8
ELECTRODE BLDE 4.0 EZ CLN MEGD (MISCELLANEOUS) ×3 IMPLANT
ELECTRODE REM PT RTRN 9FT ADLT (ELECTROSURGICAL) ×6 IMPLANT
FELT TEFLON 1X6 (MISCELLANEOUS) ×7 IMPLANT
GAUZE SPONGE 4X4 12PLY STRL (GAUZE/BANDAGES/DRESSINGS) ×8 IMPLANT
GLOVE SURG ENC MOIS LTX SZ7 (GLOVE) ×6 IMPLANT
GLOVE SURG ENC TEXT LTX SZ7.5 (GLOVE) ×8 IMPLANT
GOWN STRL REUS W/ TWL LRG LVL3 (GOWN DISPOSABLE) ×12 IMPLANT
GOWN STRL REUS W/ TWL XL LVL3 (GOWN DISPOSABLE) ×6 IMPLANT
GOWN STRL REUS W/TWL LRG LVL3 (GOWN DISPOSABLE) ×7
GOWN STRL REUS W/TWL XL LVL3 (GOWN DISPOSABLE) ×2
HEMOSTAT POWDER SURGIFOAM 1G (HEMOSTASIS) ×11 IMPLANT
INSERT FOGARTY XLG (MISCELLANEOUS) ×1 IMPLANT
INSERT SUTURE HOLDER (MISCELLANEOUS) ×4 IMPLANT
KIT BASIN OR (CUSTOM PROCEDURE TRAY) ×4 IMPLANT
KIT SUCTION CATH 14FR (SUCTIONS) ×4 IMPLANT
KIT TURNOVER KIT B (KITS) ×4 IMPLANT
KIT VASOVIEW HEMOPRO 2 VH 4000 (KITS) ×4 IMPLANT
LEAD PACING MYOCARDI (MISCELLANEOUS) ×4 IMPLANT
MARKER GRAFT CORONARY BYPASS (MISCELLANEOUS) ×11 IMPLANT
NS IRRIG 1000ML POUR BTL (IV SOLUTION) ×20 IMPLANT
PACK ACCESSORY CANNULA KIT (KITS) ×4 IMPLANT
PACK E OPEN HEART (SUTURE) ×4 IMPLANT
PACK OPEN HEART (CUSTOM PROCEDURE TRAY) ×4 IMPLANT
PAD ARMBOARD 7.5X6 YLW CONV (MISCELLANEOUS) ×8 IMPLANT
PAD ELECT DEFIB RADIOL ZOLL (MISCELLANEOUS) ×4 IMPLANT
PENCIL BUTTON HOLSTER BLD 10FT (ELECTRODE) ×4 IMPLANT
POSITIONER HEAD DONUT 9IN (MISCELLANEOUS) ×4 IMPLANT
PUNCH AORTIC ROTATE 4.0MM (MISCELLANEOUS) ×4 IMPLANT
SET MPS 3-ND DEL (MISCELLANEOUS) ×1 IMPLANT
SUPPORT HEART JANKE-BARRON (MISCELLANEOUS) ×4 IMPLANT
SUT BONE WAX W31G (SUTURE) ×4 IMPLANT
SUT ETHIBOND X763 2 0 SH 1 (SUTURE) ×8 IMPLANT
SUT MNCRL AB 3-0 PS2 18 (SUTURE) ×8 IMPLANT
SUT MNCRL AB 4-0 PS2 18 (SUTURE) ×1 IMPLANT
SUT PDS AB 1 CTX 36 (SUTURE) ×8 IMPLANT
SUT PROLENE 4 0 SH DA (SUTURE) ×4 IMPLANT
SUT PROLENE 5 0 C 1 36 (SUTURE) ×12 IMPLANT
SUT PROLENE 7 0 BV1 MDA (SUTURE) ×4 IMPLANT
SUT STEEL 6MS V (SUTURE) ×6 IMPLANT
SUT STEEL STERNAL CCS#1 18IN (SUTURE) ×3 IMPLANT
SUT VIC AB 1 CTX 36 (SUTURE) ×1
SUT VIC AB 1 CTX36XBRD ANBCTR (SUTURE) IMPLANT
SUT VIC AB 2-0 CT1 27 (SUTURE) ×1
SUT VIC AB 2-0 CT1 TAPERPNT 27 (SUTURE) IMPLANT
SYSTEM SAHARA CHEST DRAIN ATS (WOUND CARE) ×4 IMPLANT
TAPE CLOTH SURG 4X10 WHT LF (GAUZE/BANDAGES/DRESSINGS) ×1 IMPLANT
TOWEL GREEN STERILE (TOWEL DISPOSABLE) ×4 IMPLANT
TOWEL GREEN STERILE FF (TOWEL DISPOSABLE) ×4 IMPLANT
TRAY FOLEY SLVR 16FR TEMP STAT (SET/KITS/TRAYS/PACK) ×4 IMPLANT
TUBING LAP HI FLOW INSUFFLATIO (TUBING) ×4 IMPLANT
UNDERPAD 30X36 HEAVY ABSORB (UNDERPADS AND DIAPERS) ×4 IMPLANT
WATER STERILE IRR 1000ML POUR (IV SOLUTION) ×8 IMPLANT

## 2021-03-30 NOTE — Progress Notes (Signed)
°   °  [address]       [place] [address]             [phone]       No events overnight  Vitals:   03/29/21 2052 03/30/21 0451  BP: 136/90 133/78  Pulse: 63 (!) 53  Resp: 18 18  Temp: 98.2 F (36.8 C) 97.9 F (36.6 C)  SpO2: 96% 98%   Alert NAD Sinus EWOB  OR today for CABG  Jesus Cunningham Jesus Cunningham

## 2021-03-30 NOTE — Progress Notes (Signed)
Lung recruitment maneuver performed per MD order.

## 2021-03-30 NOTE — Anesthesia Postprocedure Evaluation (Signed)
Anesthesia Post Note  Patient: Jesus Cunningham  Procedure(s) Performed: CORONARY ARTERY BYPASS GRAFTING (CABG) TIMES THREE USING LEFT INTERAL MAMMARY ARTERY, AND ENDOSCOPICALLY HARVESTED RIGHT GREATER SAPHENOUS VEIN. (Chest) TRANSESOPHAGEAL ECHOCARDIOGRAM (TEE) (Esophagus) ENDOVEIN HARVEST OF RIGHTGREATER SAPHENOUS VEIN (Right: Leg Upper) APPLICATION OF CELL SAVER (Chest)     Patient location during evaluation: SICU Anesthesia Type: General Level of consciousness: sedated Pain management: pain level controlled Vital Signs Assessment: post-procedure vital signs reviewed and stable Respiratory status: patient remains intubated per anesthesia plan Cardiovascular status: stable Postop Assessment: no apparent nausea or vomiting Anesthetic complications: no   No notable events documented.  Last Vitals:  Vitals:   03/30/21 1545 03/30/21 1600  BP:  (!) 82/63  Pulse: 80 80  Resp: 15 14  Temp: 36.6 C 36.7 C  SpO2: 97% 96%    Last Pain:  Vitals:   03/30/21 1600  TempSrc: Bladder  PainSc: 0-No pain                 Earl Lites P Aydrien Froman

## 2021-03-30 NOTE — Progress Notes (Signed)
° °   °  [address]       [place] [address]             [phone]      S/p CABG x 3  Intubated, almost self extubated earlier  BP 97/61    Pulse 68    Temp 98.6 F (37 C)    Resp 17    Ht 6' (1.829 m)    Wt 92 kg    SpO2 94%    BMI 27.51 kg/m   CVP 14, CI= 1.9   Intake/Output Summary (Last 24 hours) at 03/30/2021 1806 Last data filed at 03/30/2021 1710 Gross per 24 hour  Intake 4426.85 ml  Output 2615 ml  Net 1811.85 ml   ~ 400 ml form CT  Hct 33  Will rest on vent for awhile then wean to extubate   Viviann Spare C. Dorris Fetch, MD [facility] [phone]

## 2021-03-30 NOTE — Progress Notes (Signed)
Weaning initiated at this time 

## 2021-03-30 NOTE — Anesthesia Procedure Notes (Signed)
Procedure Name: Intubation Date/Time: 03/30/2021 8:48 AM Performed by: Waynard Edwards, CRNA Pre-anesthesia Checklist: Patient identified, Emergency Drugs available, Suction available and Patient being monitored Patient Re-evaluated:Patient Re-evaluated prior to induction Oxygen Delivery Method: Circle system utilized Preoxygenation: Pre-oxygenation with 100% oxygen Induction Type: IV induction Ventilation: Mask ventilation without difficulty and Oral airway inserted - appropriate to patient size Laryngoscope Size: Hyacinth Meeker and 2 Grade View: Grade I Tube type: Oral Tube size: 8.0 mm Number of attempts: 1 Airway Equipment and Method: Stylet and Oral airway Placement Confirmation: ETT inserted through vocal cords under direct vision, positive ETCO2 and breath sounds checked- equal and bilateral Secured at: 23 cm Tube secured with: Tape Dental Injury: Teeth and Oropharynx as per pre-operative assessment

## 2021-03-30 NOTE — Brief Op Note (Signed)
03/27/2021 - 03/30/2021  11:50 AM  PATIENT:  Jesus Cunningham  70 y.o. 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  LIMA-LAD SVG-OM SVG-PDA  Vein harvest time:     Vein prep time:  TRANSESOPHAGEAL ECHOCARDIOGRAM  ENDOVEIN HARVEST OF RIGHTGREATER SAPHENOUS VEIN (Right) APPLICATION OF CELL SAVER (N/A)  SURGEON:  Corliss Skains, MD - Primary  PHYSICIAN ASSISTANT: Rorey Bisson  ASSISTANTS: Tanda Rockers, RN, RN First Assistant   ANESTHESIA:   general  BLOOD ADMINISTERED:none  DRAINS:  mediastinal and left pleural drains    LOCAL MEDICATIONS USED:  NONE  SPECIMEN:  No Specimen  DISPOSITION OF SPECIMEN:  N/A  COUNTS:Correct  DICTATION: .Dragon Dictation  PLAN OF CARE: Admit to inpatient   PATIENT DISPOSITION:  ICU - intubated and hemodynamically stable.   Delay start of Pharmacological VTE agent (>24hrs) due to surgical blood loss or risk of bleeding: yes

## 2021-03-30 NOTE — Transfer of Care (Signed)
Immediate Anesthesia Transfer of Care Note  Patient: Jesus Cunningham  Procedure(s) Performed: CORONARY ARTERY BYPASS GRAFTING (CABG) TIMES THREE USING LEFT INTERAL MAMMARY ARTERY, AND ENDOSCOPICALLY HARVESTED RIGHT GREATER SAPHENOUS VEIN. (Chest) TRANSESOPHAGEAL ECHOCARDIOGRAM (TEE) (Esophagus) ENDOVEIN HARVEST OF RIGHTGREATER SAPHENOUS VEIN (Right: Leg Upper) APPLICATION OF CELL SAVER (Chest)  Patient Location: ICU  Anesthesia Type:General  Level of Consciousness: sedated and Patient remains intubated per anesthesia plan  Airway & Oxygen Therapy: Patient remains intubated per anesthesia plan and Patient placed on Ventilator (see vital sign flow sheet for setting)  Post-op Assessment: Report given to RN and Post -op Vital signs reviewed and stable  Post vital signs: Reviewed and stable  Last Vitals:  Vitals Value Taken Time  BP 119/82 03/30/21 1315  Temp    Pulse 80 03/30/21 1317  Resp 14 03/30/21 1317  SpO2 96 % 03/30/21 1317  Vitals shown include unvalidated device data.  Last Pain:  Vitals:   03/30/21 0451  TempSrc: Oral  PainSc:          Complications: No notable events documented.

## 2021-03-30 NOTE — Anesthesia Procedure Notes (Addendum)
°  Central Venous Catheter Insertion Performed by: Atilano Median, DO, anesthesiologist Start/End12/14/2022 7:45 AM, 03/30/2021 8:10 AM Patient location: Pre-op. Preanesthetic checklist: patient identified, IV checked, site marked, risks and benefits discussed, surgical consent, monitors and equipment checked, pre-op evaluation, timeout performed and anesthesia consent Lidocaine 1% used for infiltration and patient sedated Hand hygiene performed  and maximum sterile barriers used  Catheter size: 8.5 Fr Central line was placed.Sheath introducer Procedure performed using ultrasound guided technique. Ultrasound Notes:anatomy identified, needle tip was noted to be adjacent to the nerve/plexus identified, no ultrasound evidence of intravascular and/or intraneural injection and image(s) printed for medical record Attempts: 1 Following insertion, line sutured, dressing applied and Biopatch. Post procedure assessment: blood return through all ports, free fluid flow and no air  Patient tolerated the procedure well with no immediate complications.

## 2021-03-30 NOTE — Anesthesia Procedure Notes (Signed)
Arterial Line Insertion Start/End12/14/2022 7:50 AM, 03/30/2021 7:55 AM Performed by: Waynard Edwards, CRNA, CRNA  Preanesthetic checklist: patient identified, IV checked, site marked, risks and benefits discussed, surgical consent, monitors and equipment checked, pre-op evaluation, timeout performed and anesthesia consent Lidocaine 1% used for infiltration and patient sedated Left, radial was placed Catheter size: 20 G Hand hygiene performed , maximum sterile barriers used  and Seldinger technique used Allen's test indicative of satisfactory collateral circulation Attempts: 1 Procedure performed without using ultrasound guided technique. Following insertion, dressing applied and Biopatch. Post procedure assessment: normal and unchanged  Patient tolerated the procedure well with no immediate complications.

## 2021-03-30 NOTE — Progress Notes (Signed)
Patient taken and accompanied to OR, wife present at the bedside at the time of transfer. Report given in person to Cyndia Skeeters, CRNA in the re-op area. Mikal Plane, BSN, RN

## 2021-03-30 NOTE — Progress Notes (Signed)
NAME:  Jesus Cunningham, MRN:  [id number], DOB:  1950/07/07, LOS: 2 ADMISSION DATE:  03/27/2021, CONSULTATION DATE: 03/30/2021 REFERRING MD: [name], CHIEF COMPLAINT: Postoperative respiratory failure  History of Present Illness:  70 year old man who was admitted to the ICU following urgent CABG x3 following non-STEMI.  He presented 4 days prior with 1 week history of substernal chest pain rating to both arms brought on by exertion.  He did not have any rest pain on arrival.   He ruled in for non-STEMI and underwent cardiac catheterization which revealed a 99% mid left main lesion, 99% circumflex lesion and a proximal 40% RCA with a distal 99% RCA lesion.  LV filling pressures were normal.  Echo showed normal LV systolic function with grade 1 diastolic dysfunction with no valvular abnormalities.  Cardiac risk factors are hypertension and dyslipidemia both of which were treated at the time of admission.  He is a never smoker.  Pertinent  Medical History  Hypertension, dyslipidemia  Significant Hospital Events: Including procedures, antibiotic start and stop dates in addition to other pertinent events   12/14-coronary artery bypass grafting with LIMA to LAD saphenous vein grafts to OM and PDA  Interim History / Subjective:  Patient separated easily from coronary bypass.  Did not require inotropic support.  Received 2.3 L total fluid.  Objective   Blood pressure 107/83, pulse 80, temperature (!) 97 F (36.1 C), resp. rate 14, height 6' (1.829 m), weight 92 kg, SpO2 96 %. CVP:  [13 mmHg] 13 mmHg  Vent Mode: SIMV;PSV;PRVC FiO2 (%):  [50 %] 50 % Vt Set:  [620 mL] 620 mL PEEP:  [5 cmH20] 5 cmH20 Pressure Support:  [10 cmH20] 10 cmH20 Plateau Pressure:  [14 cmH20] 14 cmH20   Intake/Output Summary (Last 24 hours) at 03/30/2021 1452 Last data filed at 03/30/2021 1400 Gross per 24 hour  Intake 3849.76 ml  Output 1640 ml  Net 2209.76 ml   Filed Weights   03/27/21 1350 03/30/21  0451  Weight: 93.7 kg 92 kg    Examination: General: Overweight male.  Intubated on phenylephrine. HENT: Orally intubated.  No OG tube. Lungs: On SIMV ventilation.  Acceptable airway pressures.  Chest clear to auscultation bilaterally. Cardiovascular: Midline sternotomy incision.  Pleural and mediastinal tubes in place.  Minimal chest tube drainage.  Heart sounds unremarkable.  No rub appreciated. Abdomen: Abdomen soft and nontender. Extremities: Right saphenous vein graft wrap in place.  No edema.  Extremities warm. Neuro: Currently sedated. GU: Foley catheter in place with clear urine.  Hemodynamic shows slightly elevated SVR.  Patient started on nitroglycerin.  Optimal stroke-volume and cardiac index.  Ancillary tests personally reviewed  Initial ABG 7.34 Hemoglobin 11.5 Platelet count 172. EKG shows sinus bradycardia with no ST-T wave abnormalities. Chest x-ray: Clear lung fields.  Lines in place.  Endotracheal tube high.  Assessment & Plan:  Critically ill due to expected acute hypoxic respiratory failure following CABG surgery require mechanical ventilation. Critically ill due to hypertension requiring titration of nitroglycerin. Coronary artery disease status post CABG Hypertension Dyslipidemia  Plan:  -Wean to extubate per cardiac surgery protocol. -Hemostasis appears to be adequate.  No blood products at this time. -Initiate multimodal pain control. -Initiate secondary prevention post extubation.  Best Practice (right click and "Reselect all SmartList Selections" daily)   Diet/type: NPO w/ oral meds DVT prophylaxis: SCD GI prophylaxis: H2B Lines: Central line and Arterial Line Foley:  Yes, and it is still needed Code Status:  full code Last date of  multidisciplinary goals of care discussion [Per cardiac surgery.]    CRITICAL CARE Performed by: Kipp Brood   Total critical care time: 35 minutes  Critical care time was exclusive of separately billable  procedures and treating other patients.  Critical care was necessary to treat or prevent imminent or life-threatening deterioration.  Critical care was time spent personally by me on the following activities: development of treatment plan with patient and/or surrogate as well as nursing, discussions with consultants, evaluation of patient's response to treatment, examination of patient, obtaining history from patient or surrogate, ordering and performing treatments and interventions, ordering and review of laboratory studies, ordering and review of radiographic studies, pulse oximetry, re-evaluation of patient's condition and participation in multidisciplinary rounds.  Kipp Brood, MD [facility] ICU Physician [place]  Pager: [phone] Mobile: [phone] After hours: [phone].

## 2021-03-30 NOTE — Progress Notes (Signed)
Patient passed all extubation criteria.   Pulmonary mechanics: NIF: -32 FVC: 1.1L  Patient has an audible cuff leak.  Patient is following commands, able to lift head off the pillow and has good extremity strength.  Will precede with extubation.  Jesus Cunningham L. Katrinka Blazing, BS, RRT-ACCS, RCP

## 2021-03-30 NOTE — Op Note (Signed)
[address]       [place] [address]             [phone]                                          03/30/2021 Patient:  Jesus Cunningham Pre-Op Dx: Left main coronary artery disease   NSTEMI   Hypertension   Hyperlipidemia Post-op Dx: Same Procedure: CABG X 3.  LIMA to LAD, reverse saphenous vein graft to PDA, reverse saphenous vein graft to OM 2 Endoscopic greater saphenous vein harvest on the right   Surgeon and Role:      * Corliss Skains, MD - Primary    *M. Roddenberry, PA-C- assisting An experienced assistant was required given the complexity of this surgery and the standard of surgical care. The assistant was needed for exposure, dissection, suctioning, retraction of delicate tissues and sutures, instrument exchange and for overall help during this procedure.    Anesthesia  general EBL:  Blood Administration: None Xclamp Time: 43 min Pump Time: 78 min  Drains: 19 F blake drain: L, mediastinal  Wires: Ventricular Counts: correct   Indications: This is a 70 year old male this admitted with chest pain.  He underwent a left heart cath which showed severe left main/three-vessel coronary artery disease.  He currently is asymptomatic.  I reviewed his echocardiogram he does have preserved biventricular function and no significant valvular disease.   We will plan for three-vessel CABG on 03/30/2021.  Findings: Good vein conduit, good LIMA.  Large LAD target.  Moderate sized PDA target.  Moderate sized obtuse marginal target.  Operative Technique: All invasive lines were placed in pre-op holding.  After the risks, benefits and alternatives were thoroughly discussed, the patient was brought to the operative theatre.  Anesthesia was induced, and the patient was prepped and draped in normal sterile fashion.  An appropriate surgical pause was performed, and pre-operative antibiotics were dosed accordingly.  We began with simultaneous  incisions along the right leg for harvesting of the greater saphenous vein and the chest for the sternotomy.  In regards to the sternotomy, this was carried down with bovie cautery, and the sternum was divided with a reciprocating saw.  Meticulous hemostasis was obtained.  The left internal thoracic artery was exposed and harvested in in pedicled fashion.  The patient was systemically heparinized, and the artery was divided distally, and placed in a papaverine sponge.    The sternal elevator was removed, and a retractor was placed.  The pericardium was divided in the midline and fashioned into a cradle with pericardial stitches.   After we confirmed an appropriate ACT, the ascending aorta was cannulated in standard fashion.  The right atrial appendage was used for venous cannulation site.  Cardiopulmonary bypass was initiated, and the heart retractor was placed. The cross clamp was applied, and a dose of anterograde cardioplegia was given with good arrest of the heart.  We moved to the posterior wall of the heart, and found a good target on the PDA.  An arteriotomy was made, and the vein graft was anastomosed to it in an end to side fashion.  Next we exposed the lateral wall, and found a good target on the obtuse marginal.  An end to side anastomosis with the vein graft was then created.  Finally, we exposed a  good target on the LAD, and fashioned an end to side anastomosis between it and the LITA.  We began to re-warm, and a re-animation dose of cardioplegia was given.  The heart was de-aired, and the cross clamp was removed.  Meticulous hemostasis was obtained.    A partial occludding clamp was then placed on the ascending aorta, and we created an end to side anastomosis between it and the proximal vein grafts.  Rings were placed on the proximal anastomosis.  Hemostasis was obtained, and we separated from cardiopulmonary bypass without event.  The heparin was reversed with protamine.  Chest tubes and wires  were placed, and the sternum was re-approximated with sternal wires.  The soft tissue and skin were re-approximated wth absorbable suture.    The patient tolerated the procedure without any immediate complications, and was transferred to the ICU in guarded condition.  Mackenzy Eisenberg Keane Scrape

## 2021-03-30 NOTE — Progress Notes (Signed)
°   03/30/21 1620  Clinical Encounter Type  Visited With Family  Visit Type Social support  Referral From Nurse  Consult/Referral To Chaplain   Chaplain Tery Sanfilippo was approached by the patient's wife, [name]. She inquired if she could visit her husband. I checked with the unit secretary, and she informed Jesus Cunningham that his wife had been in the waiting room since 12:50. This note was prepared by Deneen Harts, M.Div..  For questions please contact by phone [phone].

## 2021-03-30 NOTE — Procedures (Signed)
Extubation Procedure Note  Patient Details:   Name: Jesus Cunningham DOB: [birth year] MRN: [id number]   Airway Documentation:    Vent end date: 03/30/21 Vent end time: 2024   Evaluation  O2 sats: stable throughout Complications: No apparent complications Patient did tolerate procedure well. Bilateral Breath Sounds: Clear, Diminished   Yes, ability to speak post-extubation    Extubation procedure was clearly explained to the patient. Patient nodded his head for understanding. Patient passed extubation criteria. Patient was extubated to a 4L  w/ bubble humidification. No complications noted.   Benjamine Sprague, BS, RRT-ACCS, RCP 03/30/2021, 8:33 PM

## 2021-03-31 ENCOUNTER — Inpatient Hospital Stay (HOSPITAL_COMMUNITY): Payer: BC Managed Care – PPO

## 2021-03-31 ENCOUNTER — Encounter (HOSPITAL_COMMUNITY): Payer: Self-pay | Admitting: Thoracic Surgery (Cardiothoracic Vascular Surgery)

## 2021-03-31 LAB — CBC
HCT: 31 % — ABNORMAL LOW (ref 39.0–52.0)
HCT: 30.5 % — ABNORMAL LOW (ref 39.0–52.0)
Hemoglobin: 11.1 g/dL — ABNORMAL LOW (ref 13.0–17.0)
Hemoglobin: 10.2 g/dL — ABNORMAL LOW (ref 13.0–17.0)
MCH: 33.8 pg (ref 26.0–34.0)
MCH: 32.9 pg (ref 26.0–34.0)
MCHC: 35.8 g/dL (ref 30.0–36.0)
MCHC: 33.4 g/dL (ref 30.0–36.0)
MCV: 94.5 fL (ref 80.0–100.0)
MCV: 98.4 fL (ref 80.0–100.0)
Platelets: 195 10*3/uL (ref 150–400)
Platelets: 180 10*3/uL (ref 150–400)
RBC: 3.28 MIL/uL — ABNORMAL LOW (ref 4.22–5.81)
RBC: 3.1 MIL/uL — ABNORMAL LOW (ref 4.22–5.81)
RDW: 12.5 % (ref 11.5–15.5)
RDW: 13 % (ref 11.5–15.5)
WBC: 8.6 10*3/uL (ref 4.0–10.5)
WBC: 10.2 10*3/uL (ref 4.0–10.5)
nRBC: 0 % (ref 0.0–0.2)
nRBC: 0 % (ref 0.0–0.2)

## 2021-03-31 LAB — BASIC METABOLIC PANEL
Anion gap: 6 (ref 5–15)
Anion gap: 8 (ref 5–15)
BUN: 10 mg/dL (ref 8–23)
BUN: 16 mg/dL (ref 8–23)
CO2: 23 mmol/L (ref 22–32)
CO2: 22 mmol/L (ref 22–32)
Calcium: 8.1 mg/dL — ABNORMAL LOW (ref 8.9–10.3)
Calcium: 8.1 mg/dL — ABNORMAL LOW (ref 8.9–10.3)
Chloride: 105 mmol/L (ref 98–111)
Chloride: 102 mmol/L (ref 98–111)
Creatinine, Ser: 0.93 mg/dL (ref 0.61–1.24)
Creatinine, Ser: 1.4 mg/dL — ABNORMAL HIGH (ref 0.61–1.24)
GFR, Estimated: >60 mL/min (ref >60)
GFR, Estimated: 54 mL/min — ABNORMAL LOW (ref >60)
Glucose, Bld: 128 mg/dL — ABNORMAL HIGH (ref 70–99)
Glucose, Bld: 131 mg/dL — ABNORMAL HIGH (ref 70–99)
Potassium: 4.2 mmol/L (ref 3.5–5.1)
Potassium: 4.2 mmol/L (ref 3.5–5.1)
Sodium: 134 mmol/L — ABNORMAL LOW (ref 135–145)
Sodium: 132 mmol/L — ABNORMAL LOW (ref 135–145)

## 2021-03-31 LAB — GLUCOSE, CAPILLARY
Glucose-Capillary: 104 mg/dL — ABNORMAL HIGH (ref 70–99)
Glucose-Capillary: 119 mg/dL — ABNORMAL HIGH (ref 70–99)
Glucose-Capillary: 123 mg/dL — ABNORMAL HIGH (ref 70–99)
Glucose-Capillary: 127 mg/dL — ABNORMAL HIGH (ref 70–99)
Glucose-Capillary: 147 mg/dL — ABNORMAL HIGH (ref 70–99)
Glucose-Capillary: 66 mg/dL — ABNORMAL LOW (ref 70–99)
Glucose-Capillary: 98 mg/dL (ref 70–99)

## 2021-03-31 LAB — MAGNESIUM
Magnesium: 2.4 mg/dL (ref 1.7–2.4)
Magnesium: 2.3 mg/dL (ref 1.7–2.4)

## 2021-03-31 MED ORDER — METOPROLOL TARTRATE 25 MG PO TABS
25.0000 mg | ORAL_TABLET | Freq: Two times a day (BID) | ORAL | Status: DC
  Administered 2021-03-31 – 2021-04-03 (×6): 25 mg via ORAL
  Filled 2021-03-31 (×7): qty 1

## 2021-03-31 MED ORDER — ENOXAPARIN SODIUM 30 MG/0.3ML IJ SOSY
30.0000 mg | PREFILLED_SYRINGE | Freq: Every day | INTRAMUSCULAR | Status: DC
  Administered 2021-03-31 – 2021-04-01 (×2): 30 mg via SUBCUTANEOUS
  Filled 2021-03-31 (×2): qty 0.3

## 2021-03-31 MED ORDER — METOCLOPRAMIDE HCL 5 MG/ML IJ SOLN
10.0000 mg | Freq: Four times a day (QID) | INTRAMUSCULAR | Status: DC
  Administered 2021-03-31 – 2021-04-02 (×9): 10 mg via INTRAVENOUS
  Filled 2021-03-31 (×9): qty 2

## 2021-03-31 MED ORDER — INSULIN ASPART 100 UNIT/ML IJ SOLN
0.0000 [IU] | INTRAMUSCULAR | Status: DC
Start: 2021-03-31 — End: 2021-04-01

## 2021-03-31 MED ORDER — HYDROMORPHONE HCL 1 MG/ML IJ SOLN
1.0000 mg | INTRAMUSCULAR | Status: DC | PRN
Start: 2021-03-31 — End: 2021-04-02
  Administered 2021-03-31 – 2021-04-01 (×9): 1 mg via INTRAVENOUS
  Filled 2021-03-31 (×9): qty 1

## 2021-03-31 MED ORDER — METOPROLOL TARTRATE 25 MG/10 ML ORAL SUSPENSION: 25.0000 mg | Freq: Two times a day (BID) | ORAL | Status: DC

## 2021-03-31 MED ORDER — GABAPENTIN 100 MG PO CAPS
200.0000 mg | ORAL_CAPSULE | Freq: Two times a day (BID) | ORAL | Status: DC
  Administered 2021-03-31 – 2021-04-03 (×7): 200 mg via ORAL
  Filled 2021-03-31 (×7): qty 2

## 2021-03-31 MED ORDER — ENOXAPARIN SODIUM 40 MG/0.4ML IJ SOSY: 40.0000 mg | PREFILLED_SYRINGE | Freq: Every day | INTRAMUSCULAR | Status: DC

## 2021-03-31 MED ORDER — INSULIN ASPART 100 UNIT/ML IJ SOLN
0.0000 [IU] | INTRAMUSCULAR | Status: DC
  Administered 2021-03-31: 2 [IU] via SUBCUTANEOUS

## 2021-03-31 NOTE — Progress Notes (Signed)
° °   °  [address]       [place] [address]             [phone]                 1 Day Post-Op Procedure(s) (LRB): CORONARY ARTERY BYPASS GRAFTING (CABG) TIMES THREE USING LEFT INTERAL MAMMARY ARTERY, AND ENDOSCOPICALLY HARVESTED RIGHT GREATER SAPHENOUS VEIN. (N/A) TRANSESOPHAGEAL ECHOCARDIOGRAM (TEE) (N/A) ENDOVEIN HARVEST OF RIGHTGREATER SAPHENOUS VEIN (Right) APPLICATION OF CELL SAVER (N/A)   Events: No events Extubated overnight _______________________________________________________________ Vitals: BP 107/70    Pulse 80    Temp 98.6 F (37 C) (Oral)    Resp 19    Ht 6' (1.829 m)    Wt 98 kg    SpO2 92%    BMI 29.30 kg/m  Filed Weights   03/27/21 1350 03/30/21 0451 03/31/21 0500  Weight: 93.7 kg 92 kg 98 kg     - Neuro: alert NAD  - Cardiovascular: sinus  Drips: none.   CVP:  [0 mmHg-16 mmHg] 10 mmHg  - Pulm: EWOB   ABG    Component Value Date/Time   PHART 7.356 03/30/2021 2127   PCO2ART 34.9 03/30/2021 2127   PO2ART 88 03/30/2021 2127   HCO3 19.4 (L) 03/30/2021 2127   TCO2 20 (L) 03/30/2021 2127   ACIDBASEDEF 5.0 (H) 03/30/2021 2127   O2SAT 96.0 03/30/2021 2127    - Abd: ND - Extremity: warm  .Intake/Output      12/14 0701 12/15 0700 12/15 0701 12/16 0700   P.O. 480    I.V. (mL/kg) 3517.6 (35.9)    Other 100    IV Piggyback 2140.1    Total Intake(mL/kg) 6237.7 (63.7)    Urine (mL/kg/hr) 2575 (1.1)    Blood 315    Chest Tube 570    Total Output 3460    Net +2777.7            _______________________________________________________________ Labs: CBC Latest Ref Rng & Units 03/31/2021 03/30/2021 03/30/2021  WBC 4.0 - 10.5 K/uL 8.6 - -  Hemoglobin 13.0 - 17.0 g/dL 11.1(L) 10.2(L) 9.9(L)  Hematocrit 39.0 - 52.0 % 31.0(L) 30.0(L) 29.0(L)  Platelets 150 - 400 K/uL 195 - -   CMP Latest Ref Rng & Units 03/31/2021 03/30/2021 03/30/2021  Glucose 70 - 99 mg/dL 539(J) - -  BUN 8 - 23 mg/dL 10 - -  Creatinine 6.73 - 1.24 mg/dL  4.19 - -  Sodium 379 - 145 mmol/L 134(L) 141 142  Potassium 3.5 - 5.1 mmol/L 4.2 4.0 3.9  Chloride 98 - 111 mmol/L 105 - -  CO2 22 - 32 mmol/L 23 - -  Calcium 8.9 - 10.3 mg/dL 8.1(L) - -  Total Protein 6.5 - 8.1 g/dL - - -  Total Bilirubin 0.3 - 1.2 mg/dL - - -  Alkaline Phos 38 - 126 U/L - - -  AST 15 - 41 U/L - - -  ALT 17 - 63 U/L - - -    CXR: PV.  Large hiatal hernia  _______________________________________________________________  Assessment and Plan: POD 1 s/p CABG  Neuro: pain controlled  CV: increasing BB.  Will remove A line.  Pacer set to back-up of 60.  Will start plavix once wires are out Pulm: will keep CTs Renal: creat stable.   GI: advancing diet Heme: heme stable ID: [email or web address] Endo: SSI Dispo: continue ICU care.     Corliss Skains 03/31/2021 7:57 AM

## 2021-03-31 NOTE — Progress Notes (Signed)
NAME:  Jesus Cunningham, MRN:  [id number], DOB:  28-Dec-1950, LOS: 3 ADMISSION DATE:  03/27/2021, CONSULTATION DATE: 03/30/2021 REFERRING MD: [name], CHIEF COMPLAINT: Postoperative respiratory failure  History of Present Illness:  69 year old man who was admitted to the ICU following urgent CABG x3 following non-STEMI.  He presented 4 days prior with 1 week history of substernal chest pain rating to both arms brought on by exertion.  He did not have any rest pain on arrival.   He ruled in for non-STEMI and underwent cardiac catheterization which revealed a 99% mid left main lesion, 99% circumflex lesion and a proximal 40% RCA with a distal 99% RCA lesion.  LV filling pressures were normal.  Echo showed normal LV systolic function with grade 1 diastolic dysfunction with no valvular abnormalities.  Cardiac risk factors are hypertension and dyslipidemia both of which were treated at the time of admission.  He is a never smoker.  Pertinent  Medical History  Hypertension, dyslipidemia  Significant Hospital Events: Including procedures, antibiotic start and stop dates in addition to other pertinent events   12/14-coronary artery bypass grafting with LIMA to LAD saphenous vein grafts to OM and PDA  Interim History / Subjective:   Extubated last pm. Still complaining of chest pain.   Objective   Blood pressure 107/70, pulse 80, temperature 100 F (37.8 C), temperature source Bladder, resp. rate 19, height 6' (1.829 m), weight 98 kg, SpO2 92 %. CVP:  [0 mmHg-16 mmHg] 16 mmHg  Vent Mode: CPAP;PSV FiO2 (%):  [40 %-50 %] 40 % Set Rate:  [4 bmp-18 bmp] 4 bmp Vt Set:  [620 mL] 620 mL PEEP:  [5 cmH20-8 cmH20] 5 cmH20 Pressure Support:  [10 cmH20] 10 cmH20 Plateau Pressure:  [14 cmH20] 14 cmH20   Intake/Output Summary (Last 24 hours) at 03/31/2021 0912 Last data filed at 03/31/2021 0800 Gross per 24 hour  Intake 5850.87 ml  Output 3510 ml  Net 2340.87 ml    Filed Weights   03/27/21  1350 03/30/21 0451 03/31/21 0500  Weight: 93.7 kg 92 kg 98 kg    Examination: General: Overweight male.  Extubated. Appears in pain.  HENT: no pressure injuries. Lungs: Splinting with deep breathing.  Bronchial breath sounds left base  Cardiovascular: Midline sternotomy incision.  Pleural and mediastinal tubes in place.  Minimal chest tube drainage.  Heart sounds unremarkable.  No rub appreciated. Abdomen: Abdomen soft and nontender. Extremities: Right saphenous vein graft wrap in place.  No edema.  Extremities warm. Neuro: Currently sedated. GU: Foley catheter in place with clear urine.   Ancillary tests personally reviewed   Hemoglobin 11.1 Platelet count 195 EKG shows sinus bradycardia with no ST-T wave abnormalities.   Assessment & Plan:  Critically ill due to expected acute hypoxic respiratory failure following CABG surgery require mechanical ventilation. Critically ill due to hypertension requiring titration of nitroglycerin. Coronary artery disease status post CABG Hypertension Dyslipidemia  Plan:  -Progress care per TCTS protocol -Initiate multimodal pain control. Add gabapentin, switch morphine to hydromorphone and consider Ketorolac if necessary.  - No diuresis today.   Best Practice (right click and "Reselect all SmartList Selections" daily)   Diet/type: Progress syndrome.  DVT prophylaxis: SCD - Lovenox. GI prophylaxis: N/A Lines: Central line and Arterial Line can be removed.  Foley:  removal ordered  Code Status:  full code Last date of multidisciplinary goals of care discussion [Patient updated at bedside.]    Lynnell Catalan, MD [facility] ICU Physician [facility]  Pager: [phone] Mobile: [phone] After hours: [phone].

## 2021-03-31 NOTE — Hospital Course (Addendum)
History of Present Illness:     Mr. Jesus Cunningham is seen Jesus Cunningham is a very pleasant 70 year old gentleman with no prior cardiac history but does have a history of hypertension, hyperlipidemia, and irritable bowel syndrome.   He formerly used chewing tobacco but has not done so since 1983.  He presented to the emergency room yesterday given history of exertional chest pressure occurring over the previous 10 days.  He described heavy pressure in both his right and left chest that was brought on with activities like stacking firewood or walking up a hill.  But would resolve with rest with an a couple of minutes.  He had a more intense episode of pain yesterday while moving some feed bags and presented to the emergency room.  EKG showed diffuse ST segment depressions.  Serial high-sensitivity troponins measured at [phone].  He was found to be hypertensive in the emergency room and was treated with nitroglycerin with resolution of both his high blood pressure and chest pain.  Chest x-ray showed clear lung fields with some mild left lower lung zone atelectasis.  There is evidence of a large hiatal hernia. Jesus Cunningham was admitted to the hospital for observation.  The cardiology team was consulted and the patient was seen by Dr. Bjorn Cunningham.  He was started on aspirin, lisinopril, and Lipitor.  He was not given a beta-blocker due to sinus bradycardia observed in the ED.   Left heart catheterization was recommended and carried out earlier today.  Study demonstrates a 99% distal left main stenosis with extension into the ostia of both the LAD and circumflex coronary arteries.  Additionally, there is a long segment 40% stenosis of the mid right coronary artery and a 99% distal right coronary stenosis just before the bifurcation of the PDA and PL.  A ventriculogram was not performed but the ventricle appears vigorous on the angiography images.  An echocardiogram has been performed earlier today but the report is not yet  available.     We have been asked to evaluate Jesus Cunningham for consideration of urgent coronary bypass grafting.   Jesus Cunningham is accompanied by his wife for 41 years.  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  He considers himself to have a healthy lifestyle with good eating habits and regular exercise.  He has not had any other medical problems other than mild hypertension and dyslipidemia.  His only previous surgery was a left ankle fusion done 12 years ago with good result.  He has regular dental visits and denies having any active dental problems at this time.  Hospital Course: Jesus Cunningham remained stable following left heart catheterization.  He was taken to the operating room electively on 03/30/2021 where CABG x3 was accomplished.  Following the procedure, he separated from cardiopulmonary bypass without any difficulty.  He was transferred to the surgical ICU in stable condition.  Vital signs and hemodynamics remained stable.  He required nitroglycerin for control of his blood pressure early postoperatively.  He was weaned from the ventilator and extubated at about 8:30 PM on the day of surgery.  On the first postoperative day, the nitroglycerin was weaned off and oral beta-blocker was initiated for blood pressure control.  The arterial line was removed.  He was started on Plavix once the patient wires were removed given his presentation with acute non-ST elevation myocardial infarction.  Diet and activity were advanced per protocol and well-tolerated.  The patient was  stable for transfer to the progressive care unit on 04/01/2021.  CT output decreased and these were removed on 04/02/2021.  The patient developed rate controlled Atrial Fibrillation.  He was started on oral Amiodarone at 400 mg BID.  The patient was volume overloaded and treated with Lasix. He developed mild hypokalemia and supplementation was increased.  He has had no further Atrial  Fibrillation.  His surgical incisions are healing without evidence of infection.  He is medically stable for discharge home today.

## 2021-03-31 NOTE — Discharge Summary (Signed)
Physician Discharge Summary  Patient ID: Jesus Cunningham MRN: [id number] DOB/AGE: January 29, 1951 70 y.o.  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   He formerly used chewing tobacco but has not done so since 1983.  He presented to the emergency room yesterday given history of exertional chest pressure occurring over the previous 10 days.  He described heavy pressure in both his right and left chest that was brought on with activities like stacking firewood or walking up a hill.  But would resolve with rest with an a couple of minutes.  He had a more intense episode of pain yesterday while moving some feed bags and presented to the emergency room.  EKG showed diffuse ST segment depressions.  Serial high-sensitivity troponins measured at [phone].  He was found to be hypertensive in the emergency room and was treated with nitroglycerin with resolution of both his high blood pressure and chest pain.  Chest x-ray showed clear lung fields with some mild left lower lung zone atelectasis.  There is evidence of a large hiatal hernia. Jesus Cunningham was admitted to the hospital for observation.  The cardiology team was consulted and the patient was seen by Dr.  Bjorn Pippin.  He was started on aspirin, lisinopril, and Lipitor.  He was not given a beta-blocker due to sinus bradycardia observed in the ED.   Left heart catheterization was recommended and carried out earlier today.  Study demonstrates a 99% distal left main stenosis with extension into the ostia of both the LAD and circumflex coronary arteries.  Additionally, there is a long segment 40% stenosis of the mid right coronary artery and a 99% distal right coronary stenosis just before the bifurcation of the PDA and PL.  A ventriculogram was not performed but the ventricle appears vigorous on the angiography images.  An echocardiogram has been performed earlier today but the report is not yet available.     We have been asked to evaluate Jesus Cunningham for consideration of urgent coronary bypass grafting.   Jesus Cunningham is accompanied by his wife for 41 years.  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  He also does farming as a Engineer, civil (consulting).  He considers himself to have a healthy lifestyle with good eating habits and regular exercise.  He has not had any other medical problems other than mild hypertension and dyslipidemia.  His only previous surgery was a left ankle fusion done 12 years ago with good result.  He has regular dental visits and denies having any active dental problems at this time.  Hospital Course: Jesus Cunningham remained stable following left heart catheterization.  He was taken to the operating room electively on 03/30/2021 where CABG x3 was accomplished.  Following  the procedure, he separated from cardiopulmonary bypass without any difficulty.  He was transferred to the surgical ICU in stable condition.  Vital signs and hemodynamics remained stable.  He required nitroglycerin for control of his blood pressure early postoperatively.  He was weaned from the ventilator and extubated at about 8:30 PM on the day of surgery.  On the first postoperative day, the nitroglycerin was weaned off  and oral beta-blocker was initiated for blood pressure control.  The arterial line was removed.  He was started on Plavix once the patient wires were removed given his presentation with acute non-ST elevation myocardial infarction.  Diet and activity were advanced per protocol and well-tolerated.  The patient was stable for transfer to the progressive care unit on 04/01/2021.  CT output decreased and these were removed on 04/02/2021.  The patient developed rate controlled Atrial Fibrillation.  He was started on oral Amiodarone at 400 mg BID.  The patient was volume overloaded and treated with Lasix. He developed mild hypokalemia and supplementation was increased.  He has had no further Atrial Fibrillation.  His surgical incisions are healing without evidence of infection.  He is medically stable for discharge home today.  Consults: None  Significant Diagnostic Studies: angiography:     Prox RCA to Dist RCA lesion is 40% stenosed.   Dist RCA lesion is 99% stenosed.   Mid LM to Ost LAD lesion is 99% stenosed with 99% stenosed side branch in Ost Cx.   Severe, heavily calcified distal left main stenosis The LAD and Circumflex are relatively disease free beyond the ostial segments and appear to be good targets for bypass The RCA is heavily calcified with moderate mid stenosis. Severe distal RCA stenosis Normal LV filling pressure   Recommendations: Will admit to telemetry here at [facility]. Will resume IV heparin 4 hours post sheath pull. CT surgery consult for CABG. Continue ASA, statin and beta blocker.   Treatments: Surgery  [address]       [place] [id number]             [phone]                                           03/30/2021 Patient:  Jesus Cunningham Pre-Op Dx: Left main coronary artery disease                         NSTEMI                         Hypertension                         Hyperlipidemia Post-op Dx: Same Procedure: CABG X 3.  LIMA to LAD, reverse  saphenous vein graft to PDA, reverse saphenous vein graft to OM 2 Endoscopic greater saphenous vein harvest on the right     Surgeon and Role:      * Corliss Skains, MD - Primary    *M. Roddenberry, PA-C- assisting An experienced assistant was required given the complexity of this surgery and the standard of surgical care. The assistant was needed for exposure, dissection, suctioning, retraction of delicate tissues and sutures, instrument exchange and for overall help during this procedure.     Anesthesia  general EBL:  Blood Administration:  None Xclamp Time: 43 min Pump Time: 78 min   Drains: 19 F blake drain: L, mediastinal  Wires: Ventricular Counts: correct     Indications: This is a 70 year old male this admitted with chest pain.  He underwent a left heart cath which showed severe left main/three-vessel coronary artery disease.  He currently is asymptomatic.  I reviewed his echocardiogram he does have preserved biventricular function and no significant valvular disease.   We will plan for three-vessel CABG on 03/30/2021.   Findings: Good vein conduit, good LIMA.  Large LAD target.  Moderate sized PDA target.  Moderate sized obtuse marginal target.  Discharge Exam: Blood pressure 125/75, pulse 66, temperature 97.8 F (36.6 C), temperature source Oral, resp. rate 19, height 6' (1.829 m), weight 96.3 kg, SpO2 100 %.  General appearance: alert, cooperative, and no distress Heart: regular rate and rhythm Lungs: clear to auscultation bilaterally Abdomen: soft, non-tender; bowel sounds normal; no masses,  no organomegaly Extremities: edema trace Wound: clean and dry  Disposition: Discharge disposition: 01-Home or Self Care        Allergies as of 04/03/2021   No Known Allergies      Medication List     STOP taking these medications    amLODipine 5 MG tablet Commonly known as: NORVASC   lisinopril-hydrochlorothiazide 20-12.5 MG tablet Commonly  known as: ZESTORETIC   meloxicam 15 MG tablet Commonly known as: MOBIC   vitamin C 1000 MG tablet       TAKE these medications    acetaminophen 500 MG tablet Commonly known as: TYLENOL Take 2 tablets (1,000 mg total) by mouth every 6 (six) hours as needed. Fever, mild pain   amiodarone 200 MG tablet Commonly known as: PACERONE Take 2 tablets (400 mg total) by mouth 2 (two) times daily. X 7 days, then decrease to 200 mg daily   aspirin EC 81 MG tablet Take 81 mg by mouth daily.   cetirizine 10 MG tablet Commonly known as: ZYRTEC Take 10 mg by mouth daily as needed for allergies.   clopidogrel 75 MG tablet Commonly known as: PLAVIX Take 1 tablet (75 mg total) by mouth daily.   diphenhydrAMINE 25 MG tablet Commonly known as: BENADRYL Take 25 mg by mouth every 6 (six) hours as needed for allergies.   fluticasone 50 MCG/ACT nasal spray Commonly known as: FLONASE Place 1 spray into both nostrils daily as needed for allergies or rhinitis.   furosemide 40 MG tablet Commonly known as: LASIX Take 1 tablet (40 mg total) by mouth daily.   lisinopril 5 MG tablet Commonly known as: ZESTRIL Take 1 tablet (5 mg total) by mouth daily.   metoprolol tartrate 25 MG tablet Commonly known as: LOPRESSOR Take 1 tablet (25 mg total) by mouth 2 (two) times daily.   potassium chloride SA 20 MEQ tablet Commonly known as: KLOR-CON M Take 1 tablet (20 mEq total) by mouth daily.   simvastatin 40 MG tablet Commonly known as: ZOCOR Take 40 mg by mouth at bedtime.   traMADol 50 MG tablet Commonly known as: ULTRAM Take 1 tablet (50 mg total) by mouth every 4 (four) hours as needed for moderate pain.   Viberzi 100 MG Tabs Generic drug: Eluxadoline Take 100 mg by mouth daily.       The patient has been discharged on:   1.Beta Blocker:  Yes [  x ]  No   [   ]                              If No, reason:  2.Ace Inhibitor/ARB: Yes [ X  ]                                      No  [    ]                                     If No, reason:  3.Statin:   Yes [ x  ]                  No  [   ]                  If No, reason:  4.Ecasa:  Yes  [  x ]                  No   [   ]                  If No, reason:  5. P2Y12 Inhibitor:   Yes  [  x ]    No   [   ]   Signed:  Keltin Baird, PA-C 04/03/2021, 10:35 AM

## 2021-03-31 NOTE — Plan of Care (Signed)
°  Problem: Clinical Measurements: Goal: Diagnostic test results will improve Outcome: Progressing Goal: Respiratory complications will improve Outcome: Progressing   Problem: Coping: Goal: Level of anxiety will decrease Outcome: Progressing   Problem: Skin Integrity: Goal: Risk for impaired skin integrity will decrease Outcome: Progressing

## 2021-03-31 NOTE — Discharge Instructions (Signed)

## 2021-03-31 NOTE — Progress Notes (Signed)
TCTS DAILY ICU PROGRESS NOTE                   [address]            [place] [address]          [phone]   1 Day Post-Op Procedure(s) (LRB): CORONARY ARTERY BYPASS GRAFTING (CABG) TIMES THREE USING LEFT INTERAL MAMMARY ARTERY, AND ENDOSCOPICALLY HARVESTED RIGHT GREATER SAPHENOUS VEIN. (N/A) TRANSESOPHAGEAL ECHOCARDIOGRAM (TEE) (N/A) ENDOVEIN HARVEST OF RIGHTGREATER SAPHENOUS VEIN (Right) APPLICATION OF CELL SAVER (N/A)  Total Length of Stay:  LOS: 3 days   Subjective: Primary c/o is pain from sternotomy  Objective: Vital signs in last 24 hours: Temp:  [97 F (36.1 C)-100.6 F (38.1 C)] 98.6 F (37 C) (12/15 0649) Pulse Rate:  [68-80] 80 (12/15 0645) Cardiac Rhythm: Ventricular paced (12/15 0400) Resp:  [9-26] 19 (12/15 0645) BP: (82-125)/(61-91) 107/70 (12/15 0600) SpO2:  [86 %-99 %] 92 % (12/15 0645) Arterial Line BP: (96-263)/(-32-78) 140/56 (12/15 0645) FiO2 (%):  [40 %-50 %] 40 % (12/14 2000) Weight:  [98 kg] 98 kg (12/15 0500)  Filed Weights   03/27/21 1350 03/30/21 0451 03/31/21 0500  Weight: 93.7 kg 92 kg 98 kg    Weight change: 6 kg   Hemodynamic parameters for last 24 hours: CVP:  [0 mmHg-16 mmHg] 10 mmHg  Intake/Output from previous day: 12/14 0701 - 12/15 0700 In: 6237.7 [P.O.:480; I.V.:3517.6; IV Piggyback:2140.1] Out: 3460 [Urine:2575; Blood:315; Chest Tube:570]  Intake/Output this shift: No intake/output data recorded.  Current Meds: Scheduled Meds:  acetaminophen  1,000 mg Oral Q6H   Or   acetaminophen (TYLENOL) oral liquid 160 mg/5 mL  1,000 mg Per Tube Q6H   aspirin EC  325 mg Oral Daily   Or   aspirin  324 mg Per Tube Daily   atorvastatin  80 mg Oral Daily   bisacodyl  10 mg Oral Daily   Or   bisacodyl  10 mg Rectal Daily   chlorhexidine gluconate (MEDLINE KIT)  15 mL Mouth Rinse BID   Chlorhexidine Gluconate Cloth  6 each Topical Daily   docusate sodium  200 mg Oral Daily   Eluxadoline  100 mg Oral Daily    insulin aspart  0-24 Units Subcutaneous Q4H   mouth rinse  15 mL Mouth Rinse BID   metoprolol tartrate  25 mg Oral BID   Or   metoprolol tartrate  25 mg Per Tube BID   [START ON 04/01/2021] pantoprazole  40 mg Oral Daily   sodium chloride flush  10-40 mL Intracatheter Q12H   sodium chloride flush  3 mL Intravenous Q12H   Continuous Infusions:  sodium chloride 20 mL/hr at 03/31/21 0600   sodium chloride     sodium chloride 20 mL/hr at 03/31/21 0600   albumin human 12.5 g (03/31/21 0611)    ceFAZolin (ANCEF) IV Stopped (03/31/21 0551)   dexmedetomidine (PRECEDEX) IV infusion Stopped (03/31/21 0207)   DOBUTamine     famotidine (PEPCID) IV Stopped (03/30/21 1421)   lactated ringers     lactated ringers     lactated ringers 20 mL/hr at 03/31/21 0600   magnesium sulfate 20 mL/hr at 03/30/21 1355   niCARDipine Stopped (03/30/21 1709)   norepinephrine (LEVOPHED) Adult infusion Stopped (03/31/21 0546)   phenylephrine (NEO-SYNEPHRINE) Adult infusion 0 mcg/min (03/30/21 1400)   PRN Meds:.sodium chloride, albumin human, lactated ringers, metoprolol tartrate, midazolam, morphine injection, ondansetron (ZOFRAN) IV, oxyCODONE, sodium chloride flush, sodium chloride flush, traMADol  General appearance: alert, cooperative, and no distress Heart: regular rate and rhythm Lungs: dim left base Abdomen: soft, non tender Extremities: minimal edema Wound: incis- dressings in place  Lab Results: CBC: Recent Labs    03/30/21 1854 03/30/21 2012 03/30/21 2127 03/31/21 0409  WBC 9.2  --   --  8.6  HGB 10.4*   < > 10.2* 11.1*  HCT 30.5*   < > 30.0* 31.0*  PLT 183  --   --  195   < > = values in this interval not displayed.   BMET:  Recent Labs    03/30/21 1854 03/30/21 2012 03/30/21 2127 03/31/21 0409  NA 135   < > 141 134*  K 4.3   < > 4.0 4.2  CL 107  --   --  105  CO2 21*  --   --  23  GLUCOSE 167*  --   --  128*  BUN 10  --   --  10  CREATININE 0.92  --   --  0.93  CALCIUM 7.9*   --   --  8.1*   < > = values in this interval not displayed.    CMET: Lab Results  Component Value Date   WBC 8.6 03/31/2021   HGB 11.1 (L) 03/31/2021   HCT 31.0 (L) 03/31/2021   PLT 195 03/31/2021   GLUCOSE 128 (H) 03/31/2021   CHOL 169 03/28/2021   TRIG 59 03/28/2021   HDL 80 03/28/2021   LDLCALC 77 03/28/2021   ALT 19 05/01/2017   AST 25 05/01/2017   NA 134 (L) 03/31/2021   K 4.2 03/31/2021   CL 105 03/31/2021   CREATININE 0.93 03/31/2021   BUN 10 03/31/2021   CO2 23 03/31/2021   INR 1.4 (H) 03/30/2021   HGBA1C 5.4 03/30/2021      PT/INR:  Recent Labs    03/30/21 1330  LABPROT 17.2*  INR 1.4*   Radiology: DG CHEST PORT 1 VIEW  Result Date: 03/30/2021 CLINICAL DATA:  ET tube repositioned EXAM: PORTABLE CHEST 1 VIEW COMPARISON:  03/30/2021 FINDINGS: Advancement of the endotracheal tube, now 2 cm above the carina. Right central line remains in stable position. Continued gaseous distention of the stomach with either large hiatal hernia or elevation of the left hemidiaphragm. Volume loss in the left lung with left lung airspace disease. Overall aeration of the left lung slightly improved since prior study. Right base atelectasis. IMPRESSION: Advancement of the endotracheal tube, now 2 cm above the carina. Continued volume loss and airspace disease/atelectasis on the left due to large hiatal hernia or elevation of the left hemidiaphragm. Gaseous distention of the stomach. Consider NG tube placement for gastric decompression. Right base atelectasis. Electronically Signed   By: Rolm Baptise M.D.   On: 03/30/2021 18:14   DG CHEST PORT 1 VIEW  Result Date: 03/30/2021 CLINICAL DATA:  CABG, ET tube EXAM: PORTABLE CHEST 1 VIEW COMPARISON:  03/30/2021 FINDINGS: Endotracheal tube 7 cm above the carina. Right central line tip in the SVC. Changes of CABG. Gaseous distention of the stomach which projects over the left lower chest, question hiatal hernia versus elevated left  hemidiaphragm. Worsening aeration in the left lung with increasing airspace disease throughout the left lung. Minimal right base atelectasis. IMPRESSION: Gaseous distention of the stomach which projects over the left lower to mid chest causing volume loss in the left chest and increasing airspace disease in the left lung. This is difficult to determine if this is a hiatal  hernia or elevation of the left hemidiaphragm. Endotracheal tube 7 cm above the carina. Electronically Signed   By: Rolm Baptise M.D.   On: 03/30/2021 18:09   DG Chest Port 1 View  Result Date: 03/30/2021 CLINICAL DATA:  Difficulty breathing, CHF EXAM: PORTABLE CHEST 1 VIEW COMPARISON:  Previous studies including the examination done earlier today FINDINGS: Transverse diameter of heart is increased. Tip of endotracheal tube is proximally 3.9 cm above the carina. Tip of right jugular central venous catheter is seen in the superior vena cava. Central pulmonary vessels are less prominent. There residual linear densities in the left parahilar region and left lower lung fields. There is radiolucency in the retrocardiac region, possibly suggesting fixed hiatal hernia. IMPRESSION: Cardiomegaly. There is interval decrease in pulmonary vascular congestion. Increased density in the left lower lung fields may be due to pleural effusion and possibly underlying infiltrate. Possible large fixed hiatal hernia. Electronically Signed   By: Elmer Picker M.D.   On: 03/30/2021 13:46   DG Chest Port 1 View  Result Date: 03/30/2021 CLINICAL DATA:  CABG, instrument count, broken Fogarty clamp EXAM: PORTABLE CHEST 1 VIEW COMPARISON:  Portable exam 1236 hours compared to 1231 hours FINDINGS: Tip of endotracheal tube projects 5.3 cm above carina. RIGHT jugular line tip projects over SVC. Mediastinal drains present. Suspect epicardial pacing wire. Enlargement of cardiac silhouette post CABG. No metallic foreign bodies identified. Scattered pulmonary  infiltrates and atelectasis LEFT lung. No pneumothorax. IMPRESSION: No retained metallic foreign body/broken clamp identified. Findings called to OR 14 on 03/30/2021 at 1258 hours. Electronically Signed   By: Lavonia Dana M.D.   On: 03/30/2021 12:58   DG CHEST PORT 1 VIEW  Result Date: 03/30/2021 CLINICAL DATA:  Rule out retained foreign body. EXAM: PORTABLE CHEST 1 VIEW COMPARISON:  Chest x-ray dated March 27, 2021. FINDINGS: Endotracheal tube tip is obscured by the esophageal probe, but is approximately 5 cm above the carina. Right internal jugular central venous catheter with tip in the distal SVC. Left chest tube and mediastinal drain noted. Status post CABG. Low lung volumes with bibasilar atelectasis. No large pleural effusion or pneumothorax. Unchanged large hiatal hernia. No acute osseous abnormality. IMPRESSION: 1. No radiopaque foreign body. 2. Status post CABG with low lung volumes. 3. Appropriately positioned lines and tubes. These results were called by telephone at the time of interpretation on 03/30/2021 at 12:51 pm to [facility], who verbally acknowledged these results. Electronically Signed   By: Titus Dubin M.D.   On: 03/30/2021 12:55   ECHO INTRAOPERATIVE TEE  Result Date: 03/30/2021  *INTRAOPERATIVE TRANSESOPHAGEAL REPORT *  Patient Name:   Jesus Cunningham Date of Exam: 03/30/2021 Medical Rec #:  [id number]        Height:       72.0 in Accession #:    [id number]       Weight:       202.8 lb Date of Birth:  [birth year]       BSA:          2.14 m Patient Age:    5 years         BP:           133/78 mmHg Patient Gender: M                HR:           53 bpm. Exam Location:  Anesthesiology Transesophogeal exam was perform intraoperatively during surgical procedure. Patient was closely monitored under  general anesthesia during the entirety of examination. Indications:     Coronary Artery Disease Performing Phys: [id number] HARRELL O LIGHTFOOT Diagnosing Phys: Rochele Pages  PROCEDURE: Intraoperative Transesophogeal Poor acoustic windows with resultant dropout and limited exam possible. Complications: No known complications during this procedure. POST-OP IMPRESSIONS _ Left Ventricle: The left ventricle is unchanged from pre-bypass. _ Right Ventricle: The right ventricle appears unchanged from pre-bypass. _ Aorta: The aorta appears unchanged from pre-bypass. _ Left Atrial Appendage: The left atrial appendage appears unchanged from pre-bypass. _ Aortic Valve: The aortic valve appears unchanged from pre-bypass. _ Mitral Valve: The mitral valve appears unchanged from pre-bypass. _ Tricuspid Valve: The tricuspid valve appears unchanged from pre-bypass. _ Pulmonic Valve: The pulmonic valve appears unchanged from pre-bypass. _ Interatrial Septum: The interatrial septum appears unchanged from pre-bypass. _ Pericardium: The pericardium appears unchanged from pre-bypass. _ Comments: S/P CABG X 3. No new or worsening wall motion or valvular issues. Preserved EF. PRE-OP FINDINGS  Left Ventricle: The left ventricle has normal systolic function, with an ejection fraction of 55-60%. The cavity size was normal. No evidence of left ventricular regional wall motion abnormalities. There is no left ventricular hypertrophy. Left ventricular diastolic function could not be evaluated. Right Ventricle: The right ventricle has normal systolic function. The cavity was normal. There is no increase in right ventricular wall thickness. Left Atrium: Left atrial size was normal in size. No left atrial/left atrial appendage thrombus was detected. Left atrial appendage velocity is normal at greater than 40 cm/s. Right Atrium: Right atrial size was normal in size. Interatrial Septum: No atrial level shunt detected by color flow Doppler. The interatrial septum appears to be lipomatous. There is no evidence of a patent foramen ovale. Pericardium: There is no evidence of pericardial effusion. Mitral Valve: The mitral valve  is normal in structure. Mitral valve regurgitation is not visualized by color flow Doppler. There is no evidence of mitral valve vegetation. There is No evidence of mitral stenosis. Tricuspid Valve: The tricuspid valve was normal in structure. Tricuspid valve regurgitation was not visualized by color flow Doppler. No evidence of tricuspid stenosis is present. There is no evidence of tricuspid valve vegetation. Aortic Valve: The aortic valve is tricuspid Aortic valve regurgitation is trivial by color flow Doppler. There is no stenosis of the aortic valve, with a calculated valve area of 2.52 cm. There is no evidence of aortic valve vegetation. Pulmonic Valve: The pulmonic valve was normal in structure. Pulmonic valve regurgitation is not visualized by color flow Doppler. Aorta: The ascending aorta and aortic root are normal in size and structure. Pulmonary Artery: The pulmonary artery is of normal size. Venous: The inferior vena cava was not well visualized. Shunts: There is no evidence of an atrial septal defect. +--------------+--------++  LEFT VENTRICLE            +--------------+--------++  PLAX 2D                   +--------------+--------++  LVOT diam:     2.30 cm    +--------------+--------++  LVOT Area:     4.15 cm   +--------------+--------++                            +--------------+--------++ +------------------+-----------++  AORTIC VALVE                     +------------------+-----------++  AV Area (Vmax):    2.55 cm      +------------------+-----------++  AV Area (Vmean):   2.11 cm      +------------------+-----------++  AV Area (VTI):     2.52 cm      +------------------+-----------++  AV Vmax:           111.00 cm/s   +------------------+-----------++  AV Vmean:          82.000 cm/s   +------------------+-----------++  AV VTI:            0.280 m       +------------------+-----------++  AV Peak Grad:      4.9 mmHg      +------------------+-----------++  AV Mean Grad:      3.0 mmHg       +------------------+-----------++  LVOT Vmax:         68.00 cm/s    +------------------+-----------++  LVOT Vmean:        41.700 cm/s   +------------------+-----------++  LVOT VTI:          0.170 m       +------------------+-----------++  LVOT/AV VTI ratio: 0.61          +------------------+-----------++  +-------------+-------++  AORTA                   +-------------+-------++  Ao Root diam: 3.80 cm   +-------------+-------++  Ao STJ diam:  3.3 cm    +-------------+-------++  Ao Asc diam:  3.80 cm   +-------------+-------++ +--------------+----------++  MITRAL VALVE                 +--------------+-------+ +--------------+----------++   SHUNTS                   MV Area (PHT): 4.89 cm      +--------------+-------+ +--------------+----------++   Systemic VTI:  0.17 m    MV Peak grad:  1.3 mmHg      +--------------+-------+ +--------------+----------++   Systemic Diam: 2.30 cm   MV Mean grad:  0.0 mmHg      +--------------+-------+ +--------------+----------++  MV Vmax:       0.58 m/s     +--------------+----------++  MV Vmean:      23.0 cm/s    +--------------+----------++  MV PHT:        44.95 msec   +--------------+----------++  MV Decel Time: 155 msec     +--------------+----------++ +--------------+----------++  MV E velocity: 52.20 cm/s   +--------------+----------++  MV A velocity: 55.60 cm/s   +--------------+----------++  MV E/A ratio:  0.94         +--------------+----------++  Rochele Pages Electronically signed by Rochele Pages Signature Date/Time: 03/30/2021/1:18:17 PM    Final      Assessment/Plan: S/P Procedure(s) (LRB): CORONARY ARTERY BYPASS GRAFTING (CABG) TIMES THREE USING LEFT INTERAL MAMMARY ARTERY, AND ENDOSCOPICALLY HARVESTED RIGHT GREATER SAPHENOUS VEIN. (N/A) TRANSESOPHAGEAL ECHOCARDIOGRAM (TEE) (N/A) ENDOVEIN HARVEST OF RIGHTGREATER SAPHENOUS VEIN (Right) APPLICATION OF CELL SAVER (N/A) POD#1  1 Tmax 100.6 S BP , 82-124, stable cardiac indicies, off gtts, sinus under  pacer, increasing  beta blocker 2 sats good on 4 liters- routine pulm rx 3 excellent UOP- normal renal fxn 4 mod CT output 570 cc- keep for now 5 minor expected ABLA 6 BS well controlled- not a diabetic 7 CXR left atx, large H hernia with gastric distension fairly stable in appearance 8 progression orders placed 9 will need plavix when wires out    [name] PA-C Pager [phone] 03/31/2021 7:37 AM

## 2021-04-01 ENCOUNTER — Inpatient Hospital Stay (HOSPITAL_COMMUNITY): Payer: BC Managed Care – PPO

## 2021-04-01 DIAGNOSIS — E871 Hypo-osmolality and hyponatremia: Secondary | ICD-10-CM

## 2021-04-01 DIAGNOSIS — K529 Noninfective gastroenteritis and colitis, unspecified: Secondary | ICD-10-CM

## 2021-04-01 DIAGNOSIS — R079 Chest pain, unspecified: Secondary | ICD-10-CM

## 2021-04-01 DIAGNOSIS — Z951 Presence of aortocoronary bypass graft: Secondary | ICD-10-CM

## 2021-04-01 LAB — CBC
HCT: 28.1 % — ABNORMAL LOW (ref 39.0–52.0)
Hemoglobin: 9.4 g/dL — ABNORMAL LOW (ref 13.0–17.0)
MCH: 32.6 pg (ref 26.0–34.0)
MCHC: 33.5 g/dL (ref 30.0–36.0)
MCV: 97.6 fL (ref 80.0–100.0)
Platelets: 161 10*3/uL (ref 150–400)
RBC: 2.88 MIL/uL — ABNORMAL LOW (ref 4.22–5.81)
RDW: 13 % (ref 11.5–15.5)
WBC: 8.2 10*3/uL (ref 4.0–10.5)
nRBC: 0 % (ref 0.0–0.2)

## 2021-04-01 LAB — GLUCOSE, CAPILLARY
Glucose-Capillary: 100 mg/dL — ABNORMAL HIGH (ref 70–99)
Glucose-Capillary: 103 mg/dL — ABNORMAL HIGH (ref 70–99)
Glucose-Capillary: 111 mg/dL — ABNORMAL HIGH (ref 70–99)
Glucose-Capillary: 116 mg/dL — ABNORMAL HIGH (ref 70–99)
Glucose-Capillary: 93 mg/dL (ref 70–99)
Glucose-Capillary: 94 mg/dL (ref 70–99)

## 2021-04-01 LAB — BASIC METABOLIC PANEL
Anion gap: 6 (ref 5–15)
BUN: 16 mg/dL (ref 8–23)
CO2: 26 mmol/L (ref 22–32)
Calcium: 8.5 mg/dL — ABNORMAL LOW (ref 8.9–10.3)
Chloride: 101 mmol/L (ref 98–111)
Creatinine, Ser: 1.03 mg/dL (ref 0.61–1.24)
GFR, Estimated: >60 mL/min (ref >60)
Glucose, Bld: 102 mg/dL — ABNORMAL HIGH (ref 70–99)
Potassium: 4.2 mmol/L (ref 3.5–5.1)
Sodium: 133 mmol/L — ABNORMAL LOW (ref 135–145)

## 2021-04-01 MED ORDER — INSULIN ASPART 100 UNIT/ML IJ SOLN: 0.0000 [IU] | INTRAMUSCULAR | Status: DC

## 2021-04-01 MED ORDER — ASPIRIN EC 81 MG PO TBEC
81.0000 mg | DELAYED_RELEASE_TABLET | Freq: Every day | ORAL | Status: DC
  Administered 2021-04-01 – 2021-04-03 (×3): 81 mg via ORAL
  Filled 2021-04-01 (×3): qty 1

## 2021-04-01 MED ORDER — FUROSEMIDE 10 MG/ML IJ SOLN
40.0000 mg | Freq: Once | INTRAMUSCULAR | Status: AC
  Administered 2021-04-01: 40 mg via INTRAVENOUS
  Filled 2021-04-01: qty 4

## 2021-04-01 MED ORDER — INSULIN ASPART 100 UNIT/ML IJ SOLN: 0.0000 [IU] | Freq: Three times a day (TID) | INTRAMUSCULAR | Status: DC

## 2021-04-01 MED ORDER — CLOPIDOGREL BISULFATE 75 MG PO TABS
75.0000 mg | ORAL_TABLET | Freq: Every day | ORAL | Status: DC
  Administered 2021-04-01 – 2021-04-03 (×3): 75 mg via ORAL
  Filled 2021-04-01 (×3): qty 1

## 2021-04-01 MED FILL — Sodium Chloride IV Soln 0.9%: INTRAVENOUS | Qty: 2000 | Status: AC

## 2021-04-01 MED FILL — Sodium Bicarbonate IV Soln 8.4%: INTRAVENOUS | Qty: 50 | Status: AC

## 2021-04-01 MED FILL — Calcium Chloride Inj 10%: INTRAVENOUS | Qty: 10 | Status: AC

## 2021-04-01 MED FILL — Albumin, Human Inj 5%: INTRAVENOUS | Qty: 250 | Status: AC

## 2021-04-01 MED FILL — Electrolyte-R (PH 7.4) Solution: INTRAVENOUS | Qty: 4000 | Status: AC

## 2021-04-01 MED FILL — Heparin Sodium (Porcine) Inj 1000 Unit/ML: INTRAMUSCULAR | Qty: 10 | Status: AC

## 2021-04-01 MED FILL — Mannitol IV Soln 20%: INTRAVENOUS | Qty: 500 | Status: AC

## 2021-04-01 NOTE — Progress Notes (Signed)
CARDIAC REHAB PHASE I   PRE:  Rate/Rhythm: 73 SR  BP:  Sitting: 137/74      SaO2: 94 3L  MODE:  Ambulation: 540 ft   POST:  Rate/Rhythm: 90 SR  BP:  Sitting: 165/82    SaO2: 97 3L   Pt agreeable to ambulate. Pt ambulated 520ft in hallway assist of one with EVA. Pt denies pain, SOB, or dizziness. Pt returned to recliner. Encouraged continued IS use, sternal precautions, and ambulation. Transferring soon. Will continue to follow.  [id number] Reynold Bowen, RN BSN 04/01/2021 1:22 PM

## 2021-04-01 NOTE — Progress Notes (Signed)
° °   °  [address]       [place] [address]             [phone]                 2 Days Post-Op Procedure(s) (LRB): CORONARY ARTERY BYPASS GRAFTING (CABG) TIMES THREE USING LEFT INTERAL MAMMARY ARTERY, AND ENDOSCOPICALLY HARVESTED RIGHT GREATER SAPHENOUS VEIN. (N/A) TRANSESOPHAGEAL ECHOCARDIOGRAM (TEE) (N/A) ENDOVEIN HARVEST OF RIGHTGREATER SAPHENOUS VEIN (Right) APPLICATION OF CELL SAVER (N/A)   Events: No events No pacing overnight _______________________________________________________________ Vitals: BP 138/77    Pulse 69    Temp 98.3 F (36.8 C) (Oral)    Resp 15    Ht 6' (1.829 m)    Wt 99 kg    SpO2 93%    BMI 29.60 kg/m  Filed Weights   03/30/21 0451 03/31/21 0500 04/01/21 0500  Weight: 92 kg 98 kg 99 kg     - Neuro: alert NAD  - Cardiovascular: sinus  Drips: none.   CVP:  [16 mmHg] 16 mmHg  - Pulm: EWOB   ABG    Component Value Date/Time   PHART 7.356 03/30/2021 2127   PCO2ART 34.9 03/30/2021 2127   PO2ART 88 03/30/2021 2127   HCO3 19.4 (L) 03/30/2021 2127   TCO2 20 (L) 03/30/2021 2127   ACIDBASEDEF 5.0 (H) 03/30/2021 2127   O2SAT 96.0 03/30/2021 2127    - Abd: ND - Extremity: warm  .Intake/Output      12/15 0701 12/16 0700 12/16 0701 12/17 0700   P.O. 360    I.V. (mL/kg) 303.8 (3.1)    Other 0    IV Piggyback 449.9    Total Intake(mL/kg) 1113.7 (11.2)    Urine (mL/kg/hr) 80 (0)    Blood     Chest Tube 630    Total Output 710    Net +403.7            _______________________________________________________________ Labs: CBC Latest Ref Rng & Units 04/01/2021 03/31/2021 03/31/2021  WBC 4.0 - 10.5 K/uL 8.2 10.2 8.6  Hemoglobin 13.0 - 17.0 g/dL 8.4(Z) 10.2(L) 11.1(L)  Hematocrit 39.0 - 52.0 % 28.1(L) 30.5(L) 31.0(L)  Platelets 150 - 400 K/uL 161 180 195   CMP Latest Ref Rng & Units 04/01/2021 03/31/2021 03/31/2021  Glucose 70 - 99 mg/dL 660(Y) 301(S) 010(X)  BUN 8 - 23 mg/dL 16 16 10   Creatinine 0.61 - 1.24  mg/dL 3.23) 5.57(D  Sodium 135 - 145 mmol/L 133(L) 132(L) 134(L)  Potassium 3.5 - 5.1 mmol/L 4.2 4.2 4.2  Chloride 98 - 111 mmol/L 101 102 105  CO2 22 - 32 mmol/L 26 22 23   Calcium 8.9 - 10.3 mg/dL 2.20) 8.1(L) 8.1(L)  Total Protein 6.5 - 8.1 g/dL - - -  Total Bilirubin 0.3 - 1.2 mg/dL - - -  Alkaline Phos 38 - 126 U/L - - -  AST 15 - 41 U/L - - -  ALT 17 - 63 U/L - - -    CXR: PV.  Large hiatal hernia  _______________________________________________________________  Assessment and Plan: POD 2 s/p CABG  Neuro: pain controlled  CV: will remove pacing wires.  Will start plavix Pulm: will keep Cts.  SOB may also be 2/2 hiatal hernia.  Diuresing aggressively today Renal: creat down.   GI: advancing diet Heme: heme stable ID: afebrile Endo: SSI Dispo: floor   04/01/2021 7:33 AM

## 2021-04-01 NOTE — Progress Notes (Signed)
° °  NAME:  Jesus Cunningham, MRN:  [id number], DOB:  14-Dec-1950, LOS: 4 ADMISSION DATE:  03/27/2021, CONSULTATION DATE: 03/30/2021 REFERRING MD: [name], CHIEF COMPLAINT: Postoperative respiratory failure  History of Present Illness:  70 year old man who was admitted to the ICU following urgent CABG x3 following non-STEMI.  He presented 4 days prior with 1 week history of substernal chest pain rating to both arms brought on by exertion.  He did not have any rest pain on arrival.   He ruled in for non-STEMI and underwent cardiac catheterization which revealed a 99% mid left main lesion, 99% circumflex lesion and a proximal 40% RCA with a distal 99% RCA lesion.  LV filling pressures were normal.  Echo showed normal LV systolic function with grade 1 diastolic dysfunction with no valvular abnormalities.  Cardiac risk factors are hypertension and dyslipidemia both of which were treated at the time of admission.  He is a never smoker.  Pertinent  Medical History  Hypertension, dyslipidemia  Significant Hospital Events: Including procedures, antibiotic start and stop dates in addition to other pertinent events   12/14-coronary artery bypass grafting with LIMA to LAD saphenous vein grafts to OM and PDA  Interim History / Subjective:  SOB better today after walking and using IS. Nausea and pain much better today. Afebrile overnight.  Objective   Blood pressure 138/77, pulse 69, temperature 98.3 F (36.8 C), temperature source Oral, resp. rate 15, height 6' (1.829 m), weight 99 kg, SpO2 93 %. CVP:  [16 mmHg] 16 mmHg      Intake/Output Summary (Last 24 hours) at 04/01/2021 0736 Last data filed at 04/01/2021 0600 Gross per 24 hour  Intake 1113.71 ml  Output 710 ml  Net 403.71 ml    Filed Weights   03/30/21 0451 03/31/21 0500 04/01/21 0500  Weight: 92 kg 98 kg 99 kg    Examination: General: elderly man sitting up in the chair in NAD, appears younger than stated age  HENT: [place]/AT, eyes  anicteric Lungs: decreased basilar breath sounds, no wheezing or rhonchi. Moderate serosanguinous drainage from chest tubes. Cardiovascular: sternal dressing intact. S1S2, RRR Abdomen: soft, NT Extremities: mild L>R foot edema, no cyanosis or clubbing Neuro: awake and alert, moving all extremities, answering questions appropriately Derm: bruising, warm & dry, no rashes.   Ancillary tests personally reviewed  Na+  133 BUN 16 Cr 1.03 H/H 9.4/28.1 CXR personally reviewed> large HH filled with air, atelectasis vs effusion in the left base.  Assessment & Plan:  Acute hypoxic respiratory failure-expected post-op -wean supplemental O2 -pulmonary hygiene and OOB mobility -chest tubes per TCTS -pain control-- tramadol, oxycodone, dilaudid  Hypertension-- improved -con't metoprolol -pain control -con't holding PTA antihypertensives  Coronary artery disease status post CABG -DAPT, statin daily -metoprolol -tele monitoring  Dyslipidemia -statin  AKI, resolved -ok for diuresis today -hold ACE  Hyponatremia, suspect hypervolemic -diuresis  Chronic diarrhea -con't PTA eluxadoline  Hiatal hernia -con't reglan  Hypergylcemia -SSI PRN-- minimal insulin requirements  Transferring to the floor today.   Best Practice (right click and "Reselect all SmartList Selections" daily)   Diet/type: regular DVT prophylaxis: SCD - Lovenox GI prophylaxis: N/A Lines: Central line  Foley:  removal ordered  Code Status:  full code Last date of multidisciplinary goals of care discussion [Patient updated at bedside.]   Steffanie Dunn, DO 04/01/21 7:56 AM [facility]

## 2021-04-01 NOTE — Progress Notes (Signed)
Pt arrived from 2H, VSS, CHG complete, orders checked and released, oriented to unit, call light within reach.   Kalman Jewels, RN 04/01/2021 1:49 PM

## 2021-04-02 DIAGNOSIS — I9789 Other postprocedural complications and disorders of the circulatory system, not elsewhere classified: Secondary | ICD-10-CM

## 2021-04-02 DIAGNOSIS — I4891 Unspecified atrial fibrillation: Secondary | ICD-10-CM

## 2021-04-02 LAB — GLUCOSE, CAPILLARY
Glucose-Capillary: 102 mg/dL — ABNORMAL HIGH (ref 70–99)
Glucose-Capillary: 126 mg/dL — ABNORMAL HIGH (ref 70–99)
Glucose-Capillary: 91 mg/dL (ref 70–99)
Glucose-Capillary: 107 mg/dL — ABNORMAL HIGH (ref 70–99)
Glucose-Capillary: 106 mg/dL — ABNORMAL HIGH (ref 70–99)

## 2021-04-02 LAB — BPAM RBC
Blood Product Expiration Date: 202301072359
Blood Product Expiration Date: 202301072359
Unit Type and Rh: 5100
Unit Type and Rh: 5100

## 2021-04-02 LAB — TYPE AND SCREEN
ABO/RH(D): O POS
Antibody Screen: NEGATIVE
Unit division: 0
Unit division: 0

## 2021-04-02 MED ORDER — FUROSEMIDE 40 MG PO TABS
40.0000 mg | ORAL_TABLET | Freq: Every day | ORAL | Status: DC
  Administered 2021-04-02 – 2021-04-03 (×2): 40 mg via ORAL
  Filled 2021-04-02 (×2): qty 1

## 2021-04-02 MED ORDER — POTASSIUM CHLORIDE CRYS ER 20 MEQ PO TBCR
20.0000 meq | EXTENDED_RELEASE_TABLET | Freq: Every day | ORAL | Status: DC
  Administered 2021-04-02 – 2021-04-03 (×2): 20 meq via ORAL
  Filled 2021-04-02 (×2): qty 1

## 2021-04-02 MED ORDER — AMIODARONE HCL 200 MG PO TABS
400.0000 mg | ORAL_TABLET | Freq: Two times a day (BID) | ORAL | Status: DC
  Administered 2021-04-02 – 2021-04-03 (×3): 400 mg via ORAL
  Filled 2021-04-02 (×3): qty 2

## 2021-04-02 NOTE — Progress Notes (Signed)
CARDIAC REHAB PHASE I   PRE:  Rate/Rhythm: 65 SR  BP:  Sitting: 136/83      SaO2: 91 RA  MODE:  Ambulation: 470 ft   POST:  Rate/Rhythm: 78 SR  BP:  Sitting: 109/98    SaO2: 95 RA   Pt ambulated 455ft in hallway standby assist with front wheel walker. Pt denies CP, SOB, or dizziness throughout session. Pt returned to recliner. Encouraged continued IS use, walks, and sternal precautions. Pt requesting walker for home use. Will continue to follow.  [id number] Reynold Bowen, RN BSN 04/02/2021 11:42 AM

## 2021-04-02 NOTE — Progress Notes (Addendum)
° °   °  [address]       [place] [address]             [phone]      3 Days Post-Op Procedure(s) (LRB): CORONARY ARTERY BYPASS GRAFTING (CABG) TIMES THREE USING LEFT INTERAL MAMMARY ARTERY, AND ENDOSCOPICALLY HARVESTED RIGHT GREATER SAPHENOUS VEIN. (N/A) TRANSESOPHAGEAL ECHOCARDIOGRAM (TEE) (N/A) ENDOVEIN HARVEST OF RIGHTGREATER SAPHENOUS VEIN (Right) APPLICATION OF CELL SAVER (N/A)  Subjective:  Patient up in chair.  Has no specific complaints.    Objective: Vital signs in last 24 hours: Temp:  [97.7 F (36.5 C)-98.5 F (36.9 C)] 97.7 F (36.5 C) (12/17 0756) Pulse Rate:  [59-98] 98 (12/17 0756) Cardiac Rhythm: Atrial fibrillation (12/17 0043) Resp:  [11-26] 17 (12/17 0756) BP: (111-152)/(68-88) 123/82 (12/17 0756) SpO2:  [90 %-97 %] 95 % (12/17 0756)  Intake/Output from previous day: 12/16 0701 - 12/17 0700 In: 580 [P.O.:580] Out: 1470 [Urine:1350; Chest Tube:120]  General appearance: alert, cooperative, and no distress Heart: irregularly irregular rhythm Lungs: clear to auscultation bilaterally Abdomen: soft, non-tender; bowel sounds normal; no masses,  no organomegaly Extremities: edema trace Wound: clean and dry  Lab Results: Recent Labs    03/31/21 1854 04/01/21 0401  WBC 10.2 8.2  HGB 10.2* 9.4*  HCT 30.5* 28.1*  PLT 180 161   BMET:  Recent Labs    03/31/21 1854 04/01/21 0401  NA 132* 133*  K 4.2 4.2  CL 102 101  CO2 22 26  GLUCOSE 131* 102*  BUN 16 16  CREATININE 1.40* 1.03  CALCIUM 8.1* 8.5*    PT/INR:  Recent Labs    03/30/21 1330  LABPROT 17.2*  INR 1.4*   ABG    Component Value Date/Time   PHART 7.356 03/30/2021 2127   HCO3 19.4 (L) 03/30/2021 2127   TCO2 20 (L) 03/30/2021 2127   ACIDBASEDEF 5.0 (H) 03/30/2021 2127   O2SAT 96.0 03/30/2021 2127   CBG (last 3)  Recent Labs    04/01/21 2120 04/02/21 0602 04/02/21 0800  GLUCAP 111* 102* 126*    Assessment/Plan: S/P Procedure(s) (LRB): CORONARY  ARTERY BYPASS GRAFTING (CABG) TIMES THREE USING LEFT INTERAL MAMMARY ARTERY, AND ENDOSCOPICALLY HARVESTED RIGHT GREATER SAPHENOUS VEIN. (N/A) TRANSESOPHAGEAL ECHOCARDIOGRAM (TEE) (N/A) ENDOVEIN HARVEST OF RIGHTGREATER SAPHENOUS VEIN (Right) APPLICATION OF CELL SAVER (N/A)  CV- Atrial Fibrillation this morning, rate is controlled- will start Amiodarone 400 mg BID, continue Lopressor, Plavix Pulm- CT output low, no air leak, no requiring oxygen, will d/c chest tubes, get CXR in AM Renal- volume overloaded on exam, creatinine has been stable, not checked today.. will transition to oral lasix, potassium, BMET in AM Expected post operative blood loss anemia, mild Hgb stable over 9 D/C Central line CBGs controlled, patient is not a diabetic, will d/c SSIP Dispo- patient with new onset rate controlled A. Fib, started around midnight, will start oral Amiodarone, d/c central line, chest tubes, diurese, repeat CXR, labs in AM   LOS: 5 days   Lowella Dandy, PA-C 04/02/2021   Chart reviewed, patient examined, agree with above. AFib in the 80's. Start oral amio to try to convert.

## 2021-04-02 NOTE — Progress Notes (Signed)
atrial fibrillation resolved around 1030h (roughly 10 hour duration). Now in NSR 60 bpm. Continue beta blocker and oral amiodarone through discharge. If no arrhythmia recurrence will hold off anticoagulation. On clopidogrel for presentation with NSTEMI.

## 2021-04-02 NOTE — Progress Notes (Signed)
Chest tubes and rt IJ removed per MD order without difficulty.  Patient educated on bed rest for one hour and BP's being frequently cycled.  Will continue to monitor.

## 2021-04-03 ENCOUNTER — Inpatient Hospital Stay (HOSPITAL_COMMUNITY): Payer: BC Managed Care – PPO

## 2021-04-03 LAB — BASIC METABOLIC PANEL
Anion gap: 8 (ref 5–15)
BUN: 8 mg/dL (ref 8–23)
CO2: 25 mmol/L (ref 22–32)
Calcium: 8.3 mg/dL — ABNORMAL LOW (ref 8.9–10.3)
Chloride: 99 mmol/L (ref 98–111)
Creatinine, Ser: 0.74 mg/dL (ref 0.61–1.24)
GFR, Estimated: >60 mL/min (ref >60)
Glucose, Bld: 114 mg/dL — ABNORMAL HIGH (ref 70–99)
Potassium: 3.4 mmol/L — ABNORMAL LOW (ref 3.5–5.1)
Sodium: 132 mmol/L — ABNORMAL LOW (ref 135–145)

## 2021-04-03 LAB — GLUCOSE, CAPILLARY: Glucose-Capillary: 95 mg/dL (ref 70–99)

## 2021-04-03 MED ORDER — AMIODARONE HCL 200 MG PO TABS: 400.0000 mg | ORAL_TABLET | Freq: Two times a day (BID) | ORAL | 1 refills | Status: DC

## 2021-04-03 MED ORDER — TRAMADOL HCL 50 MG PO TABS: 50.0000 mg | ORAL_TABLET | ORAL | 0 refills | Status: DC | PRN

## 2021-04-03 MED ORDER — LISINOPRIL 5 MG PO TABS: 5.0000 mg | ORAL_TABLET | Freq: Every day | ORAL | 0 refills | Status: DC

## 2021-04-03 MED ORDER — FUROSEMIDE 40 MG PO TABS: 40.0000 mg | ORAL_TABLET | Freq: Every day | ORAL | 0 refills | Status: DC

## 2021-04-03 MED ORDER — METOPROLOL TARTRATE 25 MG PO TABS: 25.0000 mg | ORAL_TABLET | Freq: Two times a day (BID) | ORAL | 3 refills | Status: DC

## 2021-04-03 MED ORDER — POTASSIUM CHLORIDE CRYS ER 20 MEQ PO TBCR: 20.0000 meq | EXTENDED_RELEASE_TABLET | Freq: Every day | ORAL | 0 refills | Status: DC

## 2021-04-03 MED ORDER — CLOPIDOGREL BISULFATE 75 MG PO TABS: 75.0000 mg | ORAL_TABLET | Freq: Every day | ORAL | 3 refills | Status: DC

## 2021-04-03 MED ORDER — POTASSIUM CHLORIDE CRYS ER 20 MEQ PO TBCR
40.0000 meq | EXTENDED_RELEASE_TABLET | Freq: Once | ORAL | Status: AC
  Administered 2021-04-03: 10:00:00 40 meq via ORAL
  Filled 2021-04-03: qty 2

## 2021-04-03 MED ORDER — ACETAMINOPHEN 500 MG PO TABS: 1000.0000 mg | ORAL_TABLET | Freq: Four times a day (QID) | ORAL | 0 refills | Status: DC | PRN

## 2021-04-03 NOTE — Progress Notes (Addendum)
° °   °  [address]       [place] [address]             [phone]      4 Days Post-Op Procedure(s) (LRB): CORONARY ARTERY BYPASS GRAFTING (CABG) TIMES THREE USING LEFT INTERAL MAMMARY ARTERY, AND ENDOSCOPICALLY HARVESTED RIGHT GREATER SAPHENOUS VEIN. (N/A) TRANSESOPHAGEAL ECHOCARDIOGRAM (TEE) (N/A) ENDOVEIN HARVEST OF RIGHTGREATER SAPHENOUS VEIN (Right) APPLICATION OF CELL SAVER (N/A)  Subjective:  Patient feels great.  He really wants to go home.  He is ambulating without difficulty.  + BM  Objective: Vital signs in last 24 hours: Temp:  [97.6 F (36.4 C)-98.4 F (36.9 C)] 97.8 F (36.6 C) (12/18 0758) Pulse Rate:  [63-69] 66 (12/18 0758) Cardiac Rhythm: Normal sinus rhythm (12/17 1933) Resp:  [16-25] 19 (12/18 0758) BP: (122-145)/(67-93) 125/75 (12/18 0758) SpO2:  [91 %-100 %] 100 % (12/18 0758) Weight:  [96.3 kg] 96.3 kg (12/18 0619)  Intake/Output from previous day: 12/17 0701 - 12/18 0700 In: 720 [P.O.:720] Out: 0   General appearance: alert, cooperative, and no distress Heart: regular rate and rhythm Lungs: clear to auscultation bilaterally Abdomen: soft, non-tender; bowel sounds normal; no masses,  no organomegaly Extremities: edema trace Wound: clean and dry  Lab Results: Recent Labs    03/31/21 1854 04/01/21 0401  WBC 10.2 8.2  HGB 10.2* 9.4*  HCT 30.5* 28.1*  PLT 180 161   BMET:  Recent Labs    04/01/21 0401 04/03/21 0210  NA 133* 132*  K 4.2 3.4*  CL 101 99  CO2 26 25  GLUCOSE 102* 114*  BUN 16 8  CREATININE 1.03 0.74  CALCIUM 8.5* 8.3*    PT/INR: No results for input(s): LABPROT, INR in the last 72 hours. ABG    Component Value Date/Time   PHART 7.356 03/30/2021 2127   HCO3 19.4 (L) 03/30/2021 2127   TCO2 20 (L) 03/30/2021 2127   ACIDBASEDEF 5.0 (H) 03/30/2021 2127   O2SAT 96.0 03/30/2021 2127   CBG (last 3)  Recent Labs    04/02/21 1549 04/02/21 2114 04/03/21 0620  GLUCAP 107* 106* 95     Assessment/Plan: S/P Procedure(s) (LRB): CORONARY ARTERY BYPASS GRAFTING (CABG) TIMES THREE USING LEFT INTERAL MAMMARY ARTERY, AND ENDOSCOPICALLY HARVESTED RIGHT GREATER SAPHENOUS VEIN. (N/A) TRANSESOPHAGEAL ECHOCARDIOGRAM (TEE) (N/A) ENDOVEIN HARVEST OF RIGHTGREATER SAPHENOUS VEIN (Right) APPLICATION OF CELL SAVER (N/A)  CV- no further A. Fib since 1030 yesterday morning- he remains on oral Amiodarone, Lopressor, Plavix, will resume home Lisinopril for additional BP control Pulm- no acute issues, continue IS, CXR w/o pneumothorax post chest tube removal Renal- creatinine has been stable, K is borderline at 3.4, will continue Lasix, give additional potassium today, he remains volume overloaded, but this is trending down Dispo- patient is doing well, he has not had any further A. Fib since 1030 yesterday morning, will continue Lasix, potassium, I think patient can go home today, will discuss with Dr. Laneta Simmers   LOS: 6 days    Lowella Dandy 04/03/2021

## 2021-04-03 NOTE — Progress Notes (Signed)
Orders received to discharge patient.  Telemetry monitor removed and CCMD notified.  PIV access removed.  Chest tube sutures removed per MD order without difficulty.  Patient's home meds returned to him.  Discharge instructions, follow up, medications and instructions for their use discussed with patient and wife at bedside.

## 2021-04-08 ENCOUNTER — Other Ambulatory Visit: Payer: Self-pay

## 2021-04-08 ENCOUNTER — Ambulatory Visit (INDEPENDENT_AMBULATORY_CARE_PROVIDER_SITE_OTHER): Payer: Self-pay | Admitting: Thoracic Surgery (Cardiothoracic Vascular Surgery)

## 2021-04-08 DIAGNOSIS — Z951 Presence of aortocoronary bypass graft: Secondary | ICD-10-CM

## 2021-04-08 NOTE — Progress Notes (Signed)
°   °  [address]       [place] [address]             [phone]       Patient: Home Provider: Office Consent for Telemedicine visit obtained.  Todays visit was completed via a real-time telehealth (see specific modality noted below). The patient/authorized person provided oral consent at the time of the visit to engage in a telemedicine encounter with the present provider at [facility]. The patient/authorized person was informed of the potential benefits, limitations, and risks of telemedicine. The patient/authorized person expressed understanding that the laws that protect confidentiality also apply to telemedicine. The patient/authorized person acknowledged understanding that telemedicine does not provide emergency services and that he or she would need to call 911 or proceed to the nearest hospital for help if such a need arose.   Total time spent in the clinical discussion 10 minutes.  Telehealth Modality: Phone visit (audio only)  I had a telephone visit with  Jesus Cunningham who is s/p CABG.  Overall doing well.   Pain is minimal.  Ambulating well. Vitals have been  stable.  He complains of some constipation  Jesus Cunningham will see [place] back in 1 month with a chest x-ray for cardiac rehab clearance.  Shulamit Donofrio Keane Scrape

## 2021-04-25 NOTE — Progress Notes (Signed)
Office Visit    Patient Name: Jesus Cunningham Date of Encounter: 04/26/2021  PCP:  Ludwig Clarks, [facility]  Cardiologist:  Donato Heinz, MD  Advanced Practice Provider:  No care team member to display Electrophysiologist:  None   Chief Complaint    Jesus Cunningham is a 71 y.o. male with a hx of hypertension, hyperlipidemia, IBS, CAD s/p CABGx3 (LIMA-LAD, reverse SVG-PDA, reverse SVG-OM2) presents today for follow-up after CABG  Past Medical History    Past Medical History:  Diagnosis Date   Hyperlipidemia    Hypertension    Past Surgical History:  Procedure Laterality Date   ANKLE FUSION     CORONARY ARTERY BYPASS GRAFT N/A 03/30/2021   Procedure: CORONARY ARTERY BYPASS GRAFTING (CABG) TIMES THREE USING LEFT INTERAL MAMMARY ARTERY, AND ENDOSCOPICALLY HARVESTED RIGHT GREATER SAPHENOUS VEIN.;  Surgeon: Lajuana Matte, MD;  Location: [facility];  Service: Open Heart Surgery;  Laterality: N/A;   ENDOVEIN HARVEST OF GREATER SAPHENOUS VEIN Right 03/30/2021   Procedure: ENDOVEIN HARVEST OF RIGHTGREATER SAPHENOUS VEIN;  Surgeon: Lajuana Matte, MD;  Location: [facility];  Service: Open Heart Surgery;  Laterality: Right;   LEFT HEART CATH AND CORONARY ANGIOGRAPHY N/A 03/28/2021   Procedure: LEFT HEART CATH AND CORONARY ANGIOGRAPHY;  Surgeon: Burnell Blanks, MD;  Location: [facility];  Service: Cardiovascular;  Laterality: N/A;   TEE WITHOUT CARDIOVERSION N/A 03/30/2021   Procedure: TRANSESOPHAGEAL ECHOCARDIOGRAM (TEE);  Surgeon: Lajuana Matte, MD;  Location: [facility];  Service: Open Heart Surgery;  Laterality: N/A;    Allergies  No Known Allergies  History of Present Illness    Jesus Cunningham is a 71 y.o. male with a hx of hypertension, hyperlipidemia, IBS, CAD s/p CABGx3 (LIMA-LAD, reverse SVG-PDA, reverse SVG-OM2) last seen while hospitalized.  He presented to the ED 03/27/2021 due to exertional chest  pressure over the previous 10 days.  EKG with diffuse ST segment depression.  At bedtime troponin 33 ? 48 ? 73 ? 55.  LHC with 99% distal left main stenosis with extension into the ostia of LAD and circumflex.  Additionally noted long segment 40% stenosis of mid RCA and 99% distal RCA stenosis just before the bifurcation of PDA and PL.  Echo 03/28/2021 LVEF 60 to [id number], grade 1 diastolic dysfunction, no significant valvular abnormalities.  He underwent CABG X3 03/30/2021 by Dr. Kipp Brood. He developed atrial fibrillation post operatively and was treated with Amiodarone.  He was additionally discharged on Plavix due to presentation with NSTEMI.  He required Lasix for volume overload.  Amlodipine, meloxicam, lisinopril-HCTZ were discontinued.  Presents today for follow up with his wife. 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 Reports feeling overall well since discharge. No chest pain, pressure, tightness. Notes exertional dyspnea is improving. He is walking twice per day for 15 minutes. He did go to [facility] one day earlier this week to walk as well. Endorses some fatigue. Heart rate at home routinely in the 40s and SBP routinely 130s.  No edema, orthopnea, PND. Reports no palpitations.    EKGs/Labs/Other Studies Reviewed:   The following studies were reviewed today:  LHC 03/28/21:   Prox RCA to Dist RCA lesion is 40% stenosed.   Dist RCA lesion is 99% stenosed.   Mid LM to Ost LAD lesion is 99% stenosed with 99% stenosed side branch  in [place] Cx.   Severe, heavily calcified distal left main stenosis The LAD and Circumflex are relatively disease free beyond the ostial segments and appear to be good targets for bypass The RCA is heavily calcified with moderate mid stenosis. Severe distal RCA stenosis Normal LV filling pressure   Recommendations: Will admit to telemetry here at [facility] Will resume IV heparin 4 hours post  sheath pull. CT surgery consult for CABG. Continue ASA, statin and beta blocker.    Echo 03/28/21: 1. Left ventricular ejection fraction, by estimation, is 60 to 65%. The  left ventricle has normal function. Left ventricular endocardial border  not optimally defined to evaluate regional wall motion. Left ventricular  diastolic parameters are consistent  with Grade I diastolic dysfunction (impaired relaxation).   2. Right ventricular systolic function is normal. The right ventricular  size is normal.   3. The mitral valve is normal in structure. No evidence of mitral valve  regurgitation.   4. The aortic valve is grossly normal. Aortic valve regurgitation is not  visualized.   5. Not well visualized.   EKG:  EKG is  ordered today.  The ekg ordered today demonstrates SB 44 bpm with  no significant pause. TWI lateral leads.   Recent Labs: 03/31/2021: Magnesium 2.3 04/01/2021: Hemoglobin 9.4; Platelets 161 04/03/2021: BUN 8; Creatinine, Ser 0.74; Potassium 3.4; Sodium 132  Recent Lipid Panel    Component Value Date/Time   CHOL 169 03/28/2021 0230   TRIG 59 03/28/2021 0230   HDL 80 03/28/2021 0230   CHOLHDL 2.1 03/28/2021 0230   VLDL 12 03/28/2021 0230   LDLCALC 77 03/28/2021 0230   Home Medications   Current Meds  Medication Sig   acetaminophen (TYLENOL) 500 MG tablet Take 2 tablets (1,000 mg total) by mouth every 6 (six) hours as needed. Fever, mild pain   aspirin EC 81 MG tablet Take 81 mg by mouth daily.   cetirizine (ZYRTEC) 10 MG tablet Take 10 mg by mouth daily as needed for allergies.   clopidogrel (PLAVIX) 75 MG tablet Take 1 tablet (75 mg total) by mouth daily.   diphenhydrAMINE (BENADRYL) 25 MG tablet Take 25 mg by mouth every 6 (six) hours as needed for allergies.   fluticasone (FLONASE) 50 MCG/ACT nasal spray Place 1 spray into both nostrils daily as needed for allergies or rhinitis.   lisinopril (ZESTRIL) 10 MG tablet Take 1 tablet (10 mg total) by mouth daily.    simvastatin (ZOCOR) 40 MG tablet Take 40 mg by mouth at bedtime.   traMADol (ULTRAM) 50 MG tablet Take 1 tablet (50 mg total) by mouth every 4 (four) hours as needed for moderate pain.   VIBERZI 100 MG TABS Take 100 mg by mouth daily.   [DISCONTINUED] amiodarone (PACERONE) 200 MG tablet Take 2 tablets (400 mg total) by mouth 2 (two) times daily. X 7 days, then decrease to 200 mg daily   [DISCONTINUED] furosemide (LASIX) 40 MG tablet Take 1 tablet (40 mg total) by mouth daily.   [DISCONTINUED] lisinopril (ZESTRIL) 5 MG tablet Take 1 tablet (5 mg total) by mouth daily.   [DISCONTINUED] metoprolol tartrate (LOPRESSOR) 25 MG tablet Take 1 tablet (25 mg total) by mouth 2 (two) times daily.     Review of Systems      All other systems reviewed and are otherwise negative except as noted above.  Physical Exam    VS:  BP (!) 142/78 (BP Location: Left Arm, Patient Position: Sitting, Cuff Size: Normal)  Pulse (!) 44    Ht 6' (1.829 m)    Wt 206 lb (93.4 kg)    BMI 27.94 kg/m  , BMI Body mass index is 27.94 kg/m.  Wt Readings from Last 3 Encounters:  04/26/21 206 lb (93.4 kg)  04/03/21 212 lb 6.4 oz (96.3 kg)  05/01/17 206 lb 9.6 oz (93.7 kg)     GEN: Well nourished, well developed, in no acute distress. HEENT: normal. Neck: Supple, no JVD, carotid bruits, or masses. Cardiac: RRR, no murmurs, rubs, or gallops. No clubbing, cyanosis, edema.  Radials/PT 2+ and equal bilaterally.  Respiratory:  Respirations regular and unlabored, clear to auscultation bilaterally. GI: Soft, nontender, nondistended. MS: No deformity or atrophy. Skin: Warm and dry, no rash. Midsternal incision with approximated edges, no signs of infection.  Neuro:  Strength and sensation are intact. Psych: Normal affect.  Assessment & Plan    CAD s/p NSTEMI and CABGx3 03/2021 - Echo 03/2021 normal biventricular function, no significant valvular disease. Postoperative atrial fibrillation requiring discharge on Amiodarone and  Metoprolol. No anginal symptoms since discharge. No palpitations. Incisions healing appropriately. Upcoming follow up with surgeon.  GDMT includes Plavix, Aspirin, statin.  Due to significant bradycardia - stop Metoprolol. He will check in via MyChart in 1 week. If HR persistently <55bpm, consider reduce or discontinue Amiodarone. No signs of Amiodarone toxicity. He is without lightheadedness nor near syncope, no indication for ZIO monitor. Plan to discontinue Amiodarone 2 months post CABG.  Will refer to cardiac rehab at [facility] as it is closer to his home. He is appropriate to begin cardiac rehab. Heart healthy diet and regular cardiovascular exercise encouraged.   CBC, CMP, direct LDL today for monitoring.   HTN - BP not at goal <130/80. Increase Lisinopril to 10mg  QD. Encouraged him to report BP consistently >130/80.   HLD, LDL goal <70 - LDL during admission 77. Presently on his preadmission Simvastatin. Direct LDL today. If LDL >70, plan to transition to Rosuvastatin.   Disposition: Follow up in 3 month(s) with Donato Heinz, MD or APP.  Signed, Loel Dubonnet, NP 04/26/2021, 7:43 PM [facility]

## 2021-04-26 ENCOUNTER — Encounter (HOSPITAL_BASED_OUTPATIENT_CLINIC_OR_DEPARTMENT_OTHER): Payer: Self-pay | Admitting: Family

## 2021-04-26 ENCOUNTER — Ambulatory Visit (HOSPITAL_BASED_OUTPATIENT_CLINIC_OR_DEPARTMENT_OTHER): Payer: BC Managed Care – PPO | Admitting: Family

## 2021-04-26 ENCOUNTER — Other Ambulatory Visit: Payer: Self-pay

## 2021-04-26 VITALS — BP 142/78 | HR 44 | Ht 72.0 in | Wt 206.0 lb

## 2021-04-26 DIAGNOSIS — I9789 Other postprocedural complications and disorders of the circulatory system, not elsewhere classified: Secondary | ICD-10-CM

## 2021-04-26 DIAGNOSIS — I25118 Atherosclerotic heart disease of native coronary artery with other forms of angina pectoris: Secondary | ICD-10-CM

## 2021-04-26 DIAGNOSIS — I4891 Unspecified atrial fibrillation: Secondary | ICD-10-CM

## 2021-04-26 DIAGNOSIS — I10 Essential (primary) hypertension: Secondary | ICD-10-CM

## 2021-04-26 DIAGNOSIS — E785 Hyperlipidemia, unspecified: Secondary | ICD-10-CM

## 2021-04-26 DIAGNOSIS — Z951 Presence of aortocoronary bypass graft: Secondary | ICD-10-CM

## 2021-04-26 DIAGNOSIS — Z79899 Other long term (current) drug therapy: Secondary | ICD-10-CM

## 2021-04-26 MED ORDER — LISINOPRIL 10 MG PO TABS: 10.0000 mg | ORAL_TABLET | Freq: Every day | ORAL | 1 refills | Status: DC

## 2021-04-26 MED ORDER — AMIODARONE HCL 200 MG PO TABS: 200.0000 mg | ORAL_TABLET | Freq: Every day | ORAL | 1 refills | Status: DC

## 2021-04-26 NOTE — Patient Instructions (Addendum)
Medication Instructions:  Your physician has recommended you make the following change in your medication:   STOP Metoprolol  CHANGE Lisinopril to 10mg  daily   You may STOP Amiodarone on 06/24/2021.  *If you need a refill on your cardiac medications before your next appointment, please call your pharmacy*   Lab Work: Your physician recommends that you return for lab work today: CMP, CBC, direct LDL  If you have labs (blood work) drawn today and your tests are completely normal, you will receive your results only by: MyChart Message (if you have MyChart) OR A paper copy in the mail If you have any lab test that is abnormal or we need to change your treatment, we will call you to review the results.  Testing/Procedures: Your EKG today showed sinus bradycardia.   Follow-Up: At [facility], you and your health needs are our priority.  As part of our continuing mission to provide you with exceptional heart care, we have created designated Provider Care Teams.  These Care Teams include your primary Cardiologist (physician) and Advanced Practice Providers (APPs -  Physician Assistants and Nurse Practitioners) who all work together to provide you with the care you need, when you need it.  We recommend signing up for the patient portal called "MyChart".  Sign up information is provided on this After Visit Summary.  MyChart is used to connect with patients for Virtual Visits (Telemedicine).  Patients are able to view lab/test results, encounter notes, upcoming appointments, etc.  Non-urgent messages can be sent to your provider as well.   To learn more about what you can do with MyChart, go to [facility].    Your next appointment:   07/20/21 with Dr. 09/19/21   Other Instructions  Bjorn Pippin, NP will refer you to cardiac rehab at [facility].   Please send [facility] a MyChart message in 1 week with your heart rates.   Heart Healthy Diet Recommendations: A low-salt diet is  recommended. Meats should be grilled, baked, or boiled. Avoid fried foods. Focus on lean protein sources like fish or chicken with vegetables and fruits. The American Heart Association is a [place]!  American Heart Association Diet and Lifeystyle Recommendations    Exercise recommendations: The American Heart Association recommends 150 minutes of moderate intensity exercise weekly. Try 30 minutes of moderate intensity exercise 4-5 times per week. This could include walking, jogging, or swimming.   For coronary artery disease often called "heart disease" we aim for optimal guideline directed medical therapy. We use the "A, B, C"s to help keep Chief Technology Officer on track!  A = Aspirin 81mg  daily B = Blood pressure control. Our goal is BP <130/80. C = Cholesterol control. You take Simvastatin to help control your cholesterol.  D = Don't forget nitroglycerin! This is an emergency tablet to be used if you have chest pain. E = Extras. In your case, this is Clopidogrel (Plavix) for one year to protect the grafts placed during surgery.    Per your surgical team after open heart surgery you should not push/pull for 3 months. You should not lift more than 20 lbs until 3 months after surgery. If your surgeon has cleared you to drive, you should take breaks to mobilize every 1-2 hours if on a long trip.

## 2021-04-27 ENCOUNTER — Encounter (HOSPITAL_BASED_OUTPATIENT_CLINIC_OR_DEPARTMENT_OTHER): Payer: Self-pay

## 2021-04-27 LAB — COMPREHENSIVE METABOLIC PANEL
ALT: 18 IU/L (ref 0–44)
AST: 21 IU/L (ref 0–40)
Albumin/Globulin Ratio: 1.5 (ref 1.2–2.2)
Albumin: 4.2 g/dL (ref 3.8–4.8)
Alkaline Phosphatase: 149 IU/L — ABNORMAL HIGH (ref 44–121)
BUN/Creatinine Ratio: 12 (ref 10–24)
BUN: 13 mg/dL (ref 8–27)
Bilirubin Total: 0.3 mg/dL (ref 0.0–1.2)
CO2: 19 mmol/L — ABNORMAL LOW (ref 20–29)
Calcium: 9.5 mg/dL (ref 8.6–10.2)
Chloride: 102 mmol/L (ref 96–106)
Creatinine, Ser: 1.1 mg/dL (ref 0.76–1.27)
Globulin, Total: 2.8 g/dL (ref 1.5–4.5)
Glucose: 87 mg/dL (ref 70–99)
Potassium: 4.9 mmol/L (ref 3.5–5.2)
Sodium: 138 mmol/L (ref 134–144)
Total Protein: 7 g/dL (ref 6.0–8.5)
eGFR: 72 mL/min/{1.73_m2} (ref >59)

## 2021-04-27 LAB — CBC
Hematocrit: 36.2 % — ABNORMAL LOW (ref 37.5–51.0)
Hemoglobin: 12.5 g/dL — ABNORMAL LOW (ref 13.0–17.7)
MCH: 31.1 pg (ref 26.6–33.0)
MCHC: 34.5 g/dL (ref 31.5–35.7)
MCV: 90 fL (ref 79–97)
Platelets: 379 10*3/uL (ref 150–450)
RBC: 4.02 x10E6/uL — ABNORMAL LOW (ref 4.14–5.80)
RDW: 12.4 % (ref 11.6–15.4)
WBC: 6 10*3/uL (ref 3.4–10.8)

## 2021-04-27 LAB — LDL CHOLESTEROL, DIRECT: LDL Direct: 84 mg/dL (ref 0–99)

## 2021-04-27 MED ORDER — ROSUVASTATIN CALCIUM 20 MG PO TABS: 20.0000 mg | ORAL_TABLET | Freq: Every day | ORAL | 3 refills | Status: DC

## 2021-04-27 NOTE — Addendum Note (Signed)
Addended by: Marlene Lard on: 04/27/2021 01:16 PM   Modules accepted: Orders

## 2021-04-27 NOTE — Addendum Note (Signed)
Addended by: Marlene Lard on: 04/27/2021 01:32 PM   Modules accepted: Orders

## 2021-04-27 NOTE — Addendum Note (Signed)
Addended by: Marlene Lard on: 04/27/2021 01:21 PM   Modules accepted: Orders

## 2021-05-02 ENCOUNTER — Encounter (HOSPITAL_BASED_OUTPATIENT_CLINIC_OR_DEPARTMENT_OTHER): Payer: Self-pay

## 2021-05-13 ENCOUNTER — Other Ambulatory Visit: Payer: Self-pay | Admitting: Thoracic Surgery (Cardiothoracic Vascular Surgery)

## 2021-05-13 DIAGNOSIS — Z951 Presence of aortocoronary bypass graft: Secondary | ICD-10-CM

## 2021-05-17 ENCOUNTER — Ambulatory Visit
Admission: RE | Admit: 2021-05-17 | Discharge: 2021-05-17 | Disposition: A | Discharge status: Home / Self Care | Payer: BC Managed Care – PPO | Source: Ambulatory Visit | Attending: Thoracic Surgery (Cardiothoracic Vascular Surgery) | Admitting: Thoracic Surgery (Cardiothoracic Vascular Surgery)

## 2021-05-17 ENCOUNTER — Other Ambulatory Visit: Payer: Self-pay

## 2021-05-17 ENCOUNTER — Ambulatory Visit (INDEPENDENT_AMBULATORY_CARE_PROVIDER_SITE_OTHER): Payer: Self-pay | Admitting: Surgical

## 2021-05-17 VITALS — BP 145/84 | HR 71 | Resp 20 | Ht 72.0 in | Wt 217.0 lb

## 2021-05-17 DIAGNOSIS — Z951 Presence of aortocoronary bypass graft: Secondary | ICD-10-CM

## 2021-05-17 NOTE — Patient Instructions (Signed)
Discussed activity progression and restrictions °

## 2021-05-17 NOTE — Progress Notes (Signed)
[address]       [place] [address]             [phone]      Jesus Cunningham [place] Medical Record [id number] Date of Birth: 12-Nov-1950  Referring: Burnell Blanks* Primary Care: Ludwig Clarks, FNP Primary Cardiologist: Donato Heinz, MD   Chief Complaint:   POST OP FOLLOW UP Procedure: CABG X 3.  LIMA to LAD, reverse saphenous vein graft to PDA, reverse saphenous vein graft to OM 2 Endoscopic greater saphenous vein harvest on the right     Surgeon and Role:      * Lajuana Matte, MD - Primary    *M. Roddenberry, PA-C- assisting   History of Present Illness:    Patient is a 71-year-old male status post above described procedure seen in the office on today's date and routine postsurgical follow-up.  He reports that he is doing quite well.  He has started cardiac rehab 2 days a week and doing well with that.  He is also started driving.  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  He denies fevers, chills or other significant constitutional symptoms.  He has had no significant difficulties with his incisions.  He is not having any chest pain, shortness of breath, palpitations or lower extremity edema.  Overall he is quite pleased with his general progress and feeling well.      Past Medical History:  Diagnosis Date   Hyperlipidemia    Hypertension      Social History   Tobacco Use  Smoking Status Never  Smokeless Tobacco Former   Types: Chew   Quit date: 10/11/1981    Social History   Substance and Sexual Activity  Alcohol Use Yes   Alcohol/week: 4.0 standard drinks   Types: 4 Glasses of wine per week     No Known Allergies  Current Outpatient Medications  Medication Sig Dispense Refill   acetaminophen (TYLENOL) 500 MG tablet Take 2 tablets (1,000 mg total) by mouth every 6 (six) hours as needed. Fever, mild pain 30 tablet 0   amiodarone (PACERONE) 200 MG tablet Take 1 tablet (200  mg total) by mouth daily. 30 tablet 1   aspirin EC 81 MG tablet Take 81 mg by mouth daily.     cetirizine (ZYRTEC) 10 MG tablet Take 10 mg by mouth daily as needed for allergies.     clopidogrel (PLAVIX) 75 MG tablet Take 1 tablet (75 mg total) by mouth daily. 30 tablet 3   diphenhydrAMINE (BENADRYL) 25 MG tablet Take 25 mg by mouth every 6 (six) hours as needed for allergies.     fluticasone (FLONASE) 50 MCG/ACT nasal spray Place 1 spray into both nostrils daily as needed for allergies or rhinitis.     lisinopril (ZESTRIL) 10 MG tablet Take 1 tablet (10 mg total) by mouth daily. 90 tablet 1   rosuvastatin (CRESTOR) 20 MG tablet Take 1 tablet (20 mg total) by mouth daily. 90 tablet 3   traMADol (ULTRAM) 50 MG tablet Take 1 tablet (50 mg total) by mouth every 4 (four) hours as needed for moderate pain. 30 tablet 0   VIBERZI 100 MG TABS Take 100 mg by mouth daily.     No current facility-administered medications for this visit.       Physical Exam: BP (!) 145/84 (BP Location: Right Arm, Patient Position: Sitting)    Pulse 71  Resp 20    Ht 6' (1.829 m)    Wt 217 lb (98.4 kg)    SpO2 97% Comment: RA   BMI 29.43 kg/m   General appearance: alert, cooperative, and no distress Heart: regular rate and rhythm, S1, S2 normal, no murmur, click, rub or gallop Lungs: clear to auscultation bilaterally Abdomen: soft, non-tender; bowel sounds normal; no masses,  no organomegaly Extremities: Trace right lower extremity edema Wound: Incision well-healed without evidence of infection   Diagnostic Studies & Laboratory data:     Recent Radiology Findings:   DG Chest 2 View  Result Date: 05/17/2021 CLINICAL DATA:  Status post CABG 03/30/2021 EXAM: CHEST - 2 VIEW COMPARISON:  Chest radiograph 04/03/2021 FINDINGS: Post CABG changes are again seen. The cardiomediastinal silhouette is grossly stable, with unchanged cardiomegaly. The previously seen right pleural effusion and adjacent airspace opacity have  resolved. The left pleural effusion has resolved. There remains minimal left basilar subsegmental atelectasis. There is no new or worsening focal airspace disease. There is no significant residual pleural effusion. There is no pneumothorax. There is a large hiatal hernia. There is no acute osseous abnormality. IMPRESSION: Resolved bilateral pleural effusions and right basilar airspace opacity with minimal residual subsegmental atelectasis in the left base. No new or worsening focal airspace disease. Electronically Signed   By: Valetta Mole M.D.   On: 05/17/2021 12:52      Recent Lab Findings: Lab Results  Component Value Date   WBC 6.0 04/26/2021   HGB 12.5 (L) 04/26/2021   HCT 36.2 (L) 04/26/2021   PLT 379 04/26/2021   GLUCOSE 87 04/26/2021   CHOL 169 03/28/2021   TRIG 59 03/28/2021   HDL 80 03/28/2021   LDLDIRECT 84 04/26/2021   LDLCALC 77 03/28/2021   ALT 18 04/26/2021   AST 21 04/26/2021   NA 138 04/26/2021   K 4.9 04/26/2021   CL 102 04/26/2021   CREATININE 1.10 04/26/2021   BUN 13 04/26/2021   CO2 19 (L) 04/26/2021   INR 1.4 (H) 03/30/2021   HGBA1C 5.4 03/30/2021      Assessment / Plan: Patient is doing very well in his postop recovery.  His chest x-ray was reviewed.  He is asymptomatic in regards to the hiatal hernia but understands that it can be fixed if he should become a symptomic.  We reviewed further instructions regarding activity progression and restrictions.  I did not make any changes to his current medication regimen.  He will continue to follow-up with cardiology as previously.  We will see the patient again on a as needed basis for any surgically related needs or at request.      Medication Changes: No orders of the defined types were placed in this encounter.     [name], PA-C  05/17/2021 1:12 PM

## 2021-05-18 ENCOUNTER — Encounter (HOSPITAL_BASED_OUTPATIENT_CLINIC_OR_DEPARTMENT_OTHER): Payer: Self-pay

## 2021-05-18 DIAGNOSIS — I10 Essential (primary) hypertension: Secondary | ICD-10-CM

## 2021-05-18 NOTE — Telephone Encounter (Signed)
Please advise 

## 2021-05-19 MED ORDER — LISINOPRIL 20 MG PO TABS: 20.0000 mg | ORAL_TABLET | Freq: Every day | ORAL | 3 refills | Status: DC

## 2021-05-19 NOTE — Addendum Note (Signed)
Addended by: Marlene Lard on: 05/19/2021 09:40 AM   Modules accepted: Orders

## 2021-05-25 ENCOUNTER — Encounter (HOSPITAL_BASED_OUTPATIENT_CLINIC_OR_DEPARTMENT_OTHER): Payer: Self-pay

## 2021-06-01 ENCOUNTER — Telehealth: Payer: Self-pay | Admitting: Cardiology

## 2021-06-01 NOTE — Telephone Encounter (Signed)
° °  Patient Name: Jesus Cunningham  DOB: Jun 26, 1950 MRN: [id number]  Primary Cardiologist: Donato Heinz, MD  Chart reviewed as part of pre-operative protocol coverage.   Reviewed with Dr. Gardiner Rhyme. Simple dental cleanings or fillings are considered low risk procedures per guidelines and generally do not require any specific cardiac clearance. It is also generally accepted that for simple cleanings, there is no need to interrupt blood thinner therapy. Would not stop patient's aspirin or Plavix for cleaning. SBE prophylaxis is not required for the patient from a cardiac standpoint.  I will route this recommendation to the requesting party via Epic fax function and remove from pre-op pool. Will route to callback team to relay recommendation back to dental office.  Please call with questions.  Charlie Pitter, PA-C 06/01/2021, 11:16 AM

## 2021-06-01 NOTE — Telephone Encounter (Signed)
° °  Pre-operative Risk Assessment    Patient Name: Jesus Cunningham  DOB: 1950/09/08 MRN: [id number]     Request for Surgical Clearance    Procedure:   Cleaning   Date of Surgery:  Clearance 06/01/21                                 Surgeon:  Darrick Meigs Clark/ Dr. Rollene Rotunda Surgeon's Group or Practice Name:  [facility] Phone number:  [phone] Fax number:  [phone]   Type of Clearance Requested:   - Medical    Type of Anesthesia:  None    Additional requests/questions:   no  Signed, Kamira J Martinique   06/01/2021, 11:09 AM

## 2021-06-01 NOTE — Telephone Encounter (Signed)
Clearance has been faxed to dental office. Our office had not received a clearance until today when the pt was in the dental office. Generally we ask for a clearance to be faxed to the office for our pre op team to assess.

## 2021-06-03 ENCOUNTER — Encounter (HOSPITAL_BASED_OUTPATIENT_CLINIC_OR_DEPARTMENT_OTHER): Payer: Self-pay

## 2021-06-05 ENCOUNTER — Encounter (HOSPITAL_BASED_OUTPATIENT_CLINIC_OR_DEPARTMENT_OTHER): Payer: Self-pay

## 2021-06-06 NOTE — Telephone Encounter (Signed)
BP readings above goal of 130/80. We are most concerned about the pre/post BP readings being less than 130/80. He is due for repeat labs and would like to have them collected prior to changing his dose. He should have lab slips already.   He also sent a second MyChart message about an episode of feeling faint, dehydrated, rescue squad being called. I recommend staying well hydrated, making position changes slowly, and taking it easy for a couple days. If he has recurrent episodes of lightheadedness we may consider additional testing.   Best,  Alver Sorrow, NP

## 2021-06-08 ENCOUNTER — Telehealth (HOSPITAL_BASED_OUTPATIENT_CLINIC_OR_DEPARTMENT_OTHER): Payer: Self-pay

## 2021-06-08 DIAGNOSIS — I10 Essential (primary) hypertension: Secondary | ICD-10-CM

## 2021-06-08 LAB — BASIC METABOLIC PANEL
BUN/Creatinine Ratio: 16 (ref 10–24)
BUN: 13 mg/dL (ref 8–27)
CO2: 21 mmol/L (ref 20–29)
Calcium: 8.7 mg/dL (ref 8.6–10.2)
Chloride: 102 mmol/L (ref 96–106)
Creatinine, Ser: 0.79 mg/dL (ref 0.76–1.27)
Glucose: 102 mg/dL — ABNORMAL HIGH (ref 70–99)
Potassium: 3.9 mmol/L (ref 3.5–5.2)
Sodium: 138 mmol/L (ref 134–144)
eGFR: 96 mL/min/{1.73_m2} (ref >59)

## 2021-06-08 MED ORDER — LISINOPRIL 40 MG PO TABS: 40.0000 mg | ORAL_TABLET | Freq: Every day | ORAL | 3 refills | Status: DC

## 2021-06-08 NOTE — Telephone Encounter (Addendum)
Seen by patient Marella Bile on 06/08/2021  7:59 AM  Labs mailed to patient and prescription updated!    ----- Message from Loel Dubonnet, NP sent at 06/08/2021  7:37 AM EST ----- Normal kidney function and electrolytes. Per MyChart message BP remains above goal of 130/80. Recommend increase Lisinopril to 40mg  daily with repeat BMP in 2 weeks. Please mail lab slips to patient as he has them done at [facility] close to his home.

## 2021-06-08 NOTE — Telephone Encounter (Signed)
Addressed via MyChart message 06/03/21.  Loel Dubonnet, NP

## 2021-06-11 ENCOUNTER — Other Ambulatory Visit: Payer: Self-pay | Admitting: Physician Assistant

## 2021-06-11 DIAGNOSIS — I4891 Unspecified atrial fibrillation: Secondary | ICD-10-CM

## 2021-06-28 ENCOUNTER — Encounter (HOSPITAL_BASED_OUTPATIENT_CLINIC_OR_DEPARTMENT_OTHER): Payer: Self-pay

## 2021-06-28 ENCOUNTER — Telehealth (HOSPITAL_BASED_OUTPATIENT_CLINIC_OR_DEPARTMENT_OTHER): Payer: Self-pay

## 2021-06-28 LAB — BASIC METABOLIC PANEL
BUN/Creatinine Ratio: 15 (ref 10–24)
BUN: 15 mg/dL (ref 8–27)
CO2: 22 mmol/L (ref 20–29)
Calcium: 9.5 mg/dL (ref 8.6–10.2)
Chloride: 105 mmol/L (ref 96–106)
Creatinine, Ser: 1.01 mg/dL (ref 0.76–1.27)
Glucose: 81 mg/dL (ref 70–99)
Potassium: 4.8 mmol/L (ref 3.5–5.2)
Sodium: 139 mmol/L (ref 134–144)
eGFR: 80 mL/min/{1.73_m2} (ref >59)

## 2021-06-28 NOTE — Telephone Encounter (Addendum)
Called results to patient and left results on VM (ok per DPR), instructions left to call office back if patient has any questions!  ? ? ? ?----- Message from Alver Sorrow, NP sent at 06/28/2021 11:39 AM EDT ----- ?Normal kidney function, electrolytes. Continue current medications.  ?

## 2021-07-02 ENCOUNTER — Encounter (HOSPITAL_BASED_OUTPATIENT_CLINIC_OR_DEPARTMENT_OTHER): Payer: Self-pay

## 2021-07-04 NOTE — Telephone Encounter (Signed)
BP log from first half of march  ?

## 2021-07-17 ENCOUNTER — Encounter (HOSPITAL_BASED_OUTPATIENT_CLINIC_OR_DEPARTMENT_OTHER): Payer: Self-pay

## 2021-07-17 DIAGNOSIS — I10 Essential (primary) hypertension: Secondary | ICD-10-CM

## 2021-07-18 MED ORDER — HYDROCHLOROTHIAZIDE 12.5 MG PO CAPS: 12.5000 mg | ORAL_CAPSULE | Freq: Every day | ORAL | 2 refills | Status: DC

## 2021-07-19 NOTE — Progress Notes (Signed)
?Cardiology Office Note:   ? ?Date:  07/20/2021  ? ?ID:  Jesus Cunningham, DOB 08-Oct-1950, MRN [id number] ? ?PCP:  Ludwig Clarks, FNP  ?Cardiologist:  Donato Heinz, MD  ?Electrophysiologist:  None  ? ?Referring MD: Ludwig Clarks, FNP  ? ?Chief Complaint  ?Patient presents with  ? Coronary Artery Disease  ? ? ?History of Present Illness:   ? ?Jesus Cunningham is a 71 y.o. male with a hx of CAD status post CABG x3 (LIMA-LAD, SVG-PDA, SVG-OM2), hypertension, hyperlipidemia who presents for follow-up.  He he was admitted 03/2021 with NSTEMI, found to have severe multivessel CAD.  Echocardiogram 03/28/2021 showed normal biventricular function, grade 1 diastolic dysfunction, no significant valvular disease. ? ?Since last clinic visit, he reports he has been doing well.  Denies any chest pain, dyspnea, lower extremity edema, or palpitations.  Does report he had a syncopal episode in January.  States that EMS came and told him he was dehydrated.  His amiodarone has been discontinued.  He finished cardiac rehab last week.  Reports BP at home has been 130s to 140s over 80s.  Started HCTZ yesterday.  Denies any bleeding on DAPT. ? ? ? ?Past Medical History:  ?Diagnosis Date  ? Hyperlipidemia   ? Hypertension   ? ? ?Past Surgical History:  ?Procedure Laterality Date  ? ANKLE FUSION    ? CORONARY ARTERY BYPASS GRAFT N/A 03/30/2021  ? Procedure: CORONARY ARTERY BYPASS GRAFTING (CABG) TIMES THREE USING LEFT INTERAL MAMMARY ARTERY, AND ENDOSCOPICALLY HARVESTED RIGHT GREATER SAPHENOUS VEIN.;  Surgeon: Lajuana Matte, MD;  Location: [facility];  Service: Open Heart Surgery;  Laterality: N/A;  ? ENDOVEIN HARVEST OF GREATER SAPHENOUS VEIN Right 03/30/2021  ? Procedure: ENDOVEIN HARVEST OF RIGHTGREATER SAPHENOUS VEIN;  Surgeon: Lajuana Matte, MD;  Location: [facility];  Service: Open Heart Surgery;  Laterality: Right;  ? LEFT HEART CATH AND CORONARY ANGIOGRAPHY N/A 03/28/2021  ? Procedure: LEFT HEART CATH AND CORONARY  ANGIOGRAPHY;  Surgeon: Burnell Blanks, MD;  Location: [facility];  Service: Cardiovascular;  Laterality: N/A;  ? TEE WITHOUT CARDIOVERSION N/A 03/30/2021  ? Procedure: TRANSESOPHAGEAL ECHOCARDIOGRAM (TEE);  Surgeon: Lajuana Matte, MD;  Location: [facility];  Service: Open Heart Surgery;  Laterality: N/A;  ? ? ?Current Medications: ?Current Meds  ?Medication Sig  ? acetaminophen (TYLENOL) 500 MG tablet Take 2 tablets (1,000 mg total) by mouth every 6 (six) hours as needed. Fever, mild pain  ? amiodarone (PACERONE) 200 MG tablet Take 1 tablet (200 mg total) by mouth daily.  ? aspirin EC 81 MG tablet Take 81 mg by mouth daily.  ? cetirizine (ZYRTEC) 10 MG tablet Take 10 mg by mouth daily as needed for allergies.  ? clopidogrel (PLAVIX) 75 MG tablet Take 1 tablet (75 mg total) by mouth daily.  ? diphenhydrAMINE (BENADRYL) 25 MG tablet Take 25 mg by mouth every 6 (six) hours as needed for allergies.  ? fluticasone (FLONASE) 50 MCG/ACT nasal spray Place 1 spray into both nostrils daily as needed for allergies or rhinitis.  ? hydrochlorothiazide (MICROZIDE) 12.5 MG capsule Take 1 capsule (12.5 mg total) by mouth daily.  ? lisinopril (ZESTRIL) 40 MG tablet Take 1 tablet (40 mg total) by mouth daily.  ? rosuvastatin (CRESTOR) 20 MG tablet Take 1 tablet (20 mg total) by mouth daily.  ? traMADol (ULTRAM) 50 MG tablet Take 1 tablet (50 mg total) by mouth every 4 (four) hours as needed for moderate pain.  ?  VIBERZI 100 MG TABS Take 100 mg by mouth daily.  ?  ? ?Allergies:   Patient has no known allergies.  ? ?Social History  ? ?Socioeconomic History  ? Marital status: Married  ?  Spouse name: Not on file  ? Number of children: Not on file  ? Years of education: Not on file  ? Highest education level: Not on file  ?Occupational History  ? Not on file  ?Tobacco Use  ? Smoking status: Never  ? Smokeless tobacco: Former  ?  Types: Chew  ?  Quit date: 10/11/1981  ?Vaping Use  ? Vaping Use: Never used  ?Substance and  Sexual Activity  ? Alcohol use: Yes  ?  Alcohol/week: 4.0 standard drinks  ?  Types: 4 Glasses of wine per week  ? Drug use: No  ? Sexual activity: Not on file  ?Other Topics Concern  ? Not on file  ?Social History Narrative  ? Not on file  ? ?Social Determinants of Health  ? ?Financial Resource Strain: Not on file  ?Food Insecurity: Not on file  ?Transportation Needs: Not on file  ?Physical Activity: Not on file  ?Stress: Not on file  ?Social Connections: Not on file  ?  ? ?Family History: ?The patient's family history includes Diabetes in his father; Heart failure in his father; Hypertension in his brother; Stroke in his mother. ? ?ROS:   ?Please see the history of present illness.    ? All other systems reviewed and are negative. ? ?EKGs/Labs/Other Studies Reviewed:   ? ?The following studies were reviewed today: ? ? ?EKG:  EKG is not ordered today.   ? ?Recent Labs: ?03/31/2021: Magnesium 2.3 ?04/26/2021: ALT 18; Hemoglobin 12.5; Platelets 379 ?06/27/2021: BUN 15; Creatinine, Ser 1.01; Potassium 4.8; Sodium 139  ?Recent Lipid Panel ?   ?Component Value Date/Time  ? CHOL 169 03/28/2021 0230  ? TRIG 59 03/28/2021 0230  ? HDL 80 03/28/2021 0230  ? CHOLHDL 2.1 03/28/2021 0230  ? VLDL 12 03/28/2021 0230  ? [place] 77 03/28/2021 0230  ? LDLDIRECT 84 04/26/2021 1458  ? ? ?Physical Exam:   ? ?VS:  BP (!) 160/92   Pulse 62   Ht 6' (1.829 m)   Wt 213 lb 9.6 oz (96.9 kg)   SpO2 97%   BMI 28.97 kg/m?    ? ?Wt Readings from Last 3 Encounters:  ?07/20/21 213 lb 9.6 oz (96.9 kg)  ?05/17/21 217 lb (98.4 kg)  ?04/26/21 206 lb (93.4 kg)  ?  ? ?GEN:  Well nourished, well developed in no acute distress ?HEENT: Normal ?NECK: No JVD; No carotid bruits ?LYMPHATICS: No lymphadenopathy ?CARDIAC: RRR, no murmurs, rubs, gallops ?RESPIRATORY:  Clear to auscultation without rales, wheezing or rhonchi  ?ABDOMEN: Soft, non-tender, non-distended ?MUSCULOSKELETAL:  No edema; No deformity  ?SKIN: Warm and dry ?NEUROLOGIC:  Alert and oriented  x 3 ?PSYCHIATRIC:  Normal affect  ? ?ASSESSMENT:   ? ?1. Coronary artery disease of native artery of native heart with stable angina pectoris ([place])   ?2. S/P CABG (coronary artery bypass graft)   ?3. Syncope and collapse   ?4. Postoperative atrial fibrillation (HCC)   ?5. Essential hypertension   ?6. Hyperlipidemia LDL goal <70   ? ?PLAN:   ? ? ?CAD: status post CABG x3 (LIMA-LAD, SVG-PDA, SVG-OM2) on 03/30/2021.  He he was admitted 03/2021 with NSTEMI, found to have severe multivessel CAD.  Echocardiogram 03/28/2021 showed normal biventricular function, grade 1 diastolic dysfunction, no significant valvular disease. ?-  Continue aspirin 81 mg daily, Plavix 75 mg daily ?-Continue rosuvastatin 20 mg daily ?-Metoprolol was discontinued due to bradycardia ? ?Syncope: Occurred in January, suspect due to dehydration.  Check ZIO x2 weeks to evaluate for arrhythmias ? ?Postoperative atrial fibrillation: Discharged on amiodarone after CABG, discontinued in March 2023 ? ?Hypertension: On lisinopril 40 mg daily, started HCTZ 12.5 mg daily yesterday.  Asked to check BP daily for next 2 weeks and bring log and home BP monitor to calibrate to pharmacy hypertension clinic appointment in 2 weeks.  Check BMET, magnesium at that time ? ?Hyperlipidemia: On rosuvastatin 20 mg daily.  Will check lipid panel, goal LDL less than 55 ? ?RTC in 3 months ? ?Medication Adjustments/Labs and Tests Ordered: ?Current medicines are reviewed at length with the patient today.  Concerns regarding medicines are outlined above.  ?Orders Placed This Encounter  ?Procedures  ? Basic metabolic panel  ? Lipid panel  ? Magnesium  ? LONG TERM MONITOR (3-14 DAYS)  ? ?No orders of the defined types were placed in this encounter. ? ? ?Patient Instructions  ?Medication Instructions:  ?Your physician recommends that you continue on your current medications as directed. Please refer to the Current Medication list given to you today. ? ?*If you need a refill on your  cardiac medications before your next appointment, please call your pharmacy* ? ?Lab Work: ?Please return for FASTING labs in 2 weeks (BMET, Lipid, Mag)-ok to have this completed at the same time as your pharmac

## 2021-07-20 ENCOUNTER — Ambulatory Visit (INDEPENDENT_AMBULATORY_CARE_PROVIDER_SITE_OTHER): Payer: BC Managed Care – PPO | Admitting: Cardiology

## 2021-07-20 ENCOUNTER — Ambulatory Visit (INDEPENDENT_AMBULATORY_CARE_PROVIDER_SITE_OTHER): Payer: BC Managed Care – PPO

## 2021-07-20 ENCOUNTER — Encounter: Payer: Self-pay | Admitting: Cardiology

## 2021-07-20 VITALS — BP 160/92 | HR 62 | Ht 72.0 in | Wt 213.6 lb

## 2021-07-20 DIAGNOSIS — R55 Syncope and collapse: Secondary | ICD-10-CM

## 2021-07-20 DIAGNOSIS — I9789 Other postprocedural complications and disorders of the circulatory system, not elsewhere classified: Secondary | ICD-10-CM | POA: Diagnosis not present

## 2021-07-20 DIAGNOSIS — I25118 Atherosclerotic heart disease of native coronary artery with other forms of angina pectoris: Secondary | ICD-10-CM

## 2021-07-20 DIAGNOSIS — Z951 Presence of aortocoronary bypass graft: Secondary | ICD-10-CM

## 2021-07-20 DIAGNOSIS — E785 Hyperlipidemia, unspecified: Secondary | ICD-10-CM

## 2021-07-20 DIAGNOSIS — I4891 Unspecified atrial fibrillation: Secondary | ICD-10-CM

## 2021-07-20 DIAGNOSIS — I10 Essential (primary) hypertension: Secondary | ICD-10-CM

## 2021-07-20 NOTE — Patient Instructions (Addendum)
Medication Instructions:  ?Your physician recommends that you continue on your current medications as directed. Please refer to the Current Medication list given to you today. ? ?*If you need a refill on your cardiac medications before your next appointment, please call your pharmacy* ? ?Lab Work: ?Please return for FASTING labs in 2 weeks (BMET, Lipid, Mag)-ok to have this completed at the same time as your pharmacist appointment ? ?Our in office lab hours are Monday-Friday 8:00-4:00, closed for lunch 12:45-1:45 pm.  No appointment needed. ? ?Testing/Procedures: ?ZIO XT- Long Term Monitor Instructions  ? ?Your physician has requested you wear a ZIO patch monitor for _14_ days.  ?This is a single patch monitor.   [employer] supplies one patch monitor per enrollment. Additional stickers are not available. Please do not apply patch if you will be having a Nuclear Stress Test, Echocardiogram, Cardiac CT, MRI, or Chest Xray during the period you would be wearing the monitor. The patch cannot be worn during these tests. You cannot remove and re-apply the ZIO XT patch monitor.  ?Your ZIO patch monitor will be sent Fed Ex from [employer] directly to your home address. It may take 3-5 days to receive your monitor after you have been enrolled.  ?Once you have received your monitor, please review the enclosed instructions. Your monitor has already been registered assigning a specific monitor serial # to you. ? ?Billing and Patient Assistance Program Information  ? ?We have supplied [employer] with any of your insurance information on file for billing purposes. ?[employer] offers a sliding scale Patient Assistance Program for patients that do not have insurance, or whose insurance does not completely cover the cost of the ZIO monitor.   You must apply for the Patient Assistance Program to qualify for this discounted rate.     To apply, please call [employer] at [phone], select option 4, then select option 2, and ask to  apply for Patient Assistance Program.  Theodore Demark will ask your household income, and how many people are in your household.  They will quote your out-of-pocket cost based on that information.  [employer] will also be able to set up a 42-month, interest-free payment plan if needed. ? ?Applying the monitor  ? ?Shave hair from upper left chest.  ?Hold abrader disc by orange tab. Rub abrader in 40 strokes over the upper left chest as indicated in your monitor instructions.  ?Clean area with 4 enclosed alcohol pads. Let dry.  ?Apply patch as indicated in monitor instructions. Patch will be placed under collarbone on left side of chest with arrow pointing upward.  ?Rub patch adhesive wings for 2 minutes. Remove white label marked "1". Remove the white label marked "2". Rub patch adhesive wings for 2 additional minutes.  ?While looking in a mirror, press and release button in center of patch. A small green light will flash 3-4 times. This will be your only indicator that the monitor has been turned on. ?  ?Do not shower for the first 24 hours. You may shower after the first 24 hours.  ?Press the button if you feel a symptom. You will hear a small click. Record Date, Time and Symptom in the Patient Logbook.  ?When you are ready to remove the patch, follow instructions on the last 2 pages of the Patient Logbook. Stick patch monitor onto the last page of Patient Logbook.  ?Place Patient Logbook in the blue and white box.  Use locking tab on box and tape box closed securely.  The  blue and white box has prepaid postage on it. Please place it in the mailbox as soon as possible. Your physician should have your test results approximately 7 days after the monitor has been mailed back to [facility].  ?Call [facility] at [phone] if you have questions regarding your ZIO XT patch monitor. Call them immediately if you see an orange light blinking on your monitor.  ?If your monitor falls off in less than 4 days,  contact our Monitor department at [phone]. ?If your monitor becomes loose or falls off after 4 days call [employer] at [phone] for suggestions on securing your monitor.? ? ? ?Follow-Up: ?At [facility], you and your health needs are our priority.  As part of our continuing mission to provide you with exceptional heart care, we have created designated Provider Care Teams.  These Care Teams include your primary Cardiologist (physician) and Advanced Practice Providers (APPs -  Physician Assistants and Nurse Practitioners) who all work together to provide you with the care you need, when you need it. ? ?We recommend signing up for the patient portal called "MyChart".  Sign up information is provided on this After Visit Summary.  MyChart is used to connect with patients for Virtual Visits (Telemedicine).  Patients are able to view lab/test results, encounter notes, upcoming appointments, etc.  Non-urgent messages can be sent to your provider as well.   ?To learn more about what you can do with MyChart, go to [email or web address].   ? ?Your next appointment:   ?PharmD in 2 weeks ?3 months with Laurann Montana NP ?6 months with Dr. Gardiner Rhyme ? ?Other Instructions ?Please check your blood pressure at home twice daily, write it down.  Bring blood pressure cuff and blood pressure log to appointment with pharmacist. ? ?

## 2021-07-20 NOTE — Progress Notes (Unsigned)
Enrolled for [employer] to mail a ZIO XT long term holter monitor to the patients address on file.  

## 2021-07-23 DIAGNOSIS — R55 Syncope and collapse: Secondary | ICD-10-CM

## 2021-07-28 ENCOUNTER — Other Ambulatory Visit: Payer: Self-pay | Admitting: Physician Assistant

## 2021-07-30 ENCOUNTER — Other Ambulatory Visit: Payer: Self-pay | Admitting: Physician Assistant

## 2021-08-01 ENCOUNTER — Encounter: Payer: Self-pay | Admitting: Cardiology

## 2021-08-01 MED ORDER — CLOPIDOGREL BISULFATE 75 MG PO TABS: 75.0000 mg | ORAL_TABLET | Freq: Every day | ORAL | 3 refills | Status: DC

## 2021-08-03 ENCOUNTER — Ambulatory Visit: Payer: BC Managed Care – PPO | Admitting: Pharmacist

## 2021-08-03 ENCOUNTER — Encounter: Payer: Self-pay | Admitting: Pharmacist

## 2021-08-03 VITALS — BP 180/102 | HR 51 | Wt 213.6 lb

## 2021-08-03 DIAGNOSIS — I10 Essential (primary) hypertension: Secondary | ICD-10-CM

## 2021-08-03 DIAGNOSIS — I214 Non-ST elevation (NSTEMI) myocardial infarction: Secondary | ICD-10-CM

## 2021-08-03 LAB — LIPID PANEL
Chol/HDL Ratio: 2.2 ratio (ref 0.0–5.0)
Cholesterol, Total: 175 mg/dL (ref 100–199)
HDL: 78 mg/dL (ref >39)
LDL Chol Calc (NIH): 81 mg/dL (ref 0–99)
Triglycerides: 86 mg/dL (ref 0–149)
VLDL Cholesterol Cal: 16 mg/dL (ref 5–40)

## 2021-08-03 LAB — BASIC METABOLIC PANEL
BUN/Creatinine Ratio: 12 (ref 10–24)
BUN: 11 mg/dL (ref 8–27)
CO2: 24 mmol/L (ref 20–29)
Calcium: 9.9 mg/dL (ref 8.6–10.2)
Chloride: 99 mmol/L (ref 96–106)
Creatinine, Ser: 0.91 mg/dL (ref 0.76–1.27)
Glucose: 94 mg/dL (ref 70–99)
Potassium: 4.5 mmol/L (ref 3.5–5.2)
Sodium: 135 mmol/L (ref 134–144)
eGFR: 91 mL/min/{1.73_m2} (ref >59)

## 2021-08-03 LAB — MAGNESIUM: Magnesium: 2.1 mg/dL (ref 1.6–2.3)

## 2021-08-03 MED ORDER — AMLODIPINE BESYLATE 5 MG PO TABS: 5.0000 mg | ORAL_TABLET | Freq: Every day | ORAL | 1 refills | Status: DC

## 2021-08-03 MED ORDER — ROSUVASTATIN CALCIUM 20 MG PO TABS: 20.0000 mg | ORAL_TABLET | Freq: Every day | ORAL | 3 refills | Status: DC

## 2021-08-03 NOTE — Progress Notes (Signed)
Patient ID: LESS CHOU                 DOB: 1950-07-08                      MRN: [id number] ? ? ? ? ?HPI: ?Jesus Cunningham is a 71 y.o. male referred by Dr. Gardiner Rhyme to HTN clinic. PMH is significant for HTN, CAD, NSTEMI, HLD, and syncope.   ? ?Patient presents today in good spirits.  Currently managed on lisinopril 40mg  and HCTZ 12.5mg  once daily.  Takes both in the morning and has taken both about 2 hours ago.  Has only had water today. Non smoker. ? ?Has been checking blood pressure at home in the evening.  Uses Easy-Home upper arm cuff. ? ?Home reading (all taken 6:30pm-9:00pm) ?4/18: 128/63 ?4/17: 138/78 ?4/16: 130/77 ?4/15: 148/82 ?4/14: 148/84 ?4/13: 158/86 ?4/12: 147/84 ? ?Current HTN meds:  ?Lisinopril 40mg  daily ?HCTZ 12.5mg  daily ? ?Previously tried: Lopressor 25mg  daily ?BP goal: <130/80 ? ? ?Wt Readings from Last 3 Encounters:  ?07/20/21 213 lb 9.6 oz (96.9 kg)  ?05/17/21 217 lb (98.4 kg)  ?04/26/21 206 lb (93.4 kg)  ? ?BP Readings from Last 3 Encounters:  ?07/20/21 (!) 160/92  ?05/17/21 (!) 145/84  ?04/26/21 (!) 142/78  ? ?Pulse Readings from Last 3 Encounters:  ?07/20/21 62  ?05/17/21 71  ?04/26/21 (!) 44  ? ? ?Renal function: ?CrCl cannot be calculated (Patient's most recent lab result is older than the maximum 21 days allowed.). ? ?Past Medical History:  ?Diagnosis Date  ? Hyperlipidemia   ? Hypertension   ? ? ?Current Outpatient Medications on File Prior to Visit  ?Medication Sig Dispense Refill  ? acetaminophen (TYLENOL) 500 MG tablet Take 2 tablets (1,000 mg total) by mouth every 6 (six) hours as needed. Fever, mild pain 30 tablet 0  ? amiodarone (PACERONE) 200 MG tablet Take 1 tablet (200 mg total) by mouth daily. 30 tablet 1  ? aspirin EC 81 MG tablet Take 81 mg by mouth daily.    ? cetirizine (ZYRTEC) 10 MG tablet Take 10 mg by mouth daily as needed for allergies.    ? clopidogrel (PLAVIX) 75 MG tablet Take 1 tablet (75 mg total) by mouth daily. 30 tablet 3  ? diphenhydrAMINE (BENADRYL)  25 MG tablet Take 25 mg by mouth every 6 (six) hours as needed for allergies.    ? fluticasone (FLONASE) 50 MCG/ACT nasal spray Place 1 spray into both nostrils daily as needed for allergies or rhinitis.    ? hydrochlorothiazide (MICROZIDE) 12.5 MG capsule Take 1 capsule (12.5 mg total) by mouth daily. 30 capsule 2  ? lisinopril (ZESTRIL) 10 MG tablet Take 10 mg by mouth daily.    ? lisinopril (ZESTRIL) 40 MG tablet Take 1 tablet (40 mg total) by mouth daily. 90 tablet 3  ? rosuvastatin (CRESTOR) 20 MG tablet Take 1 tablet (20 mg total) by mouth daily. 90 tablet 3  ? traMADol (ULTRAM) 50 MG tablet Take 1 tablet (50 mg total) by mouth every 4 (four) hours as needed for moderate pain. 30 tablet 0  ? VIBERZI 100 MG TABS Take 100 mg by mouth daily.    ? ?No current facility-administered medications on file prior to visit.  ? ? ?No Known Allergies ? ? ?Assessment/Plan: ? ?1. Hypertension -  Patient BP in room 176/99 (rechecked at 180/102).  Home cuff accurate. All readings well above goal of <130/80.  Readings all higher  than his home readings although patient has only been checking in the evening.  Has taken his morning medications today about 2 hours ago.  Does not seem to be getting round the clock coverage on lisinopril and HCTZ.  Recommend addition of amlodipine 5mg  at this time and to take in evening.  Counseled patient on possible adverse effects.  Will update labs today per Dr Gardiner Rhyme.  Advised patient continue to check BP at home but also begin checking in the morning.  Recheck in 4 weeks. ? ?Continue lisinopril 40mg  daily ?Continue HCTZ 12.5mg  daily ?Start amlodipine 5mg  daily ?Recheck in 4 weeks ? ?Karren Cobble, PharmD, BCACP, [place], CPP ?[address], [address] ?[place], [place], [address] ?Phone: [phone], Fax: [phone]  ?

## 2021-08-03 NOTE — Patient Instructions (Signed)
It was nice meeting you this morning ? ?We would like your blood pressure to be less than 130/80 ? ?Please continue your lisinopril 40mg  and your hydrochlorothiazide 12.5mg  daily ? ?We will start a new medication called amlodipine 5mg  once daily in the evening ? ?Continue to monitor your blood pressure at home.  I suggest beginning to check it in the morning as well ? ?Dr Gardiner Rhyme would like to update your lab work today and you will be notified with the results ? ?Please call with any questions and we will see you back in a month ? ?Karren Cobble, PharmD, BCACP, [place], CPP ?[address], [address] ?[place], [place], [address] ?Phone: [phone], Fax: [phone]  ?

## 2021-08-04 ENCOUNTER — Other Ambulatory Visit: Payer: Self-pay | Admitting: *Deleted

## 2021-08-04 MED ORDER — EZETIMIBE 10 MG PO TABS: 10.0000 mg | ORAL_TABLET | Freq: Every day | ORAL | 3 refills | Status: DC

## 2021-08-09 ENCOUNTER — Encounter (HOSPITAL_BASED_OUTPATIENT_CLINIC_OR_DEPARTMENT_OTHER): Payer: Self-pay

## 2021-08-09 NOTE — Telephone Encounter (Signed)
Please advise 

## 2021-09-06 ENCOUNTER — Ambulatory Visit (INDEPENDENT_AMBULATORY_CARE_PROVIDER_SITE_OTHER): Payer: BC Managed Care – PPO | Admitting: Pharmacist

## 2021-09-06 VITALS — BP 156/96 | HR 67 | Resp 16 | Ht 72.0 in | Wt 213.4 lb

## 2021-09-06 DIAGNOSIS — I10 Essential (primary) hypertension: Secondary | ICD-10-CM | POA: Diagnosis not present

## 2021-09-06 DIAGNOSIS — I214 Non-ST elevation (NSTEMI) myocardial infarction: Secondary | ICD-10-CM

## 2021-09-06 NOTE — Patient Instructions (Addendum)
It was good seeing you again  Your blood pressure is slowly improving but still above goal of <130/80  Please continue your amlodipine 5mg  daily in the evening and hydrochlorothiazide 12.5mg  in the morning  Let's move your lisinopril 40mg  to the evening as well  Continue to check your blood pressure at home and send your readings through myChart  Please continue your exercise routine.  We recommend at least 30 minutes a day at least 5 days a week  Please call with any questions  , PharmD, BCACP, CDCES, CPP [address] [place], [address], [place] Phone: [phone], Fax: [phone]

## 2021-09-06 NOTE — Progress Notes (Unsigned)
Patient ID: Jesus Cunningham                 DOB: April 06, 1951                      MRN: [id number]     HPI: Jesus Cunningham is a 71 y.o. male referred by Dr. Bjorn Pippin to HTN clinic. PMH is significant for HTN, NSTEMI, HLD, and CABG. At last visit, patient was started on amlodipine 5mg  daily.  Patient presents today in good spirts. Has been tolerating addition of amlodipine 5mg  in the evening. Checking blood pressure off and on throughout the last month.  Readings from past month:  134/81 131/70 137/78 131/83 117/61 126/75 114/61 Blood pressures taken at random times during the day.  Takes lisinopril and HCTZ the morning and amlodipine in the evening,  Has increased his physical activity. Is now going to the gym frequently. Does cardio for an hour and free weights at the [facility] in [place].  During work he is typically behind a desk most of the day.   Current HTN meds:  Amlodipine 5mg  daily HCTZ 12.5mg  daily Lisinopril 40mg  daily  BP goal: <130/80  Wt Readings from Last 3 Encounters:  08/03/21 213 lb 9.6 oz (96.9 kg)  07/20/21 213 lb 9.6 oz (96.9 kg)  05/17/21 217 lb (98.4 kg)   BP Readings from Last 3 Encounters:  08/03/21 (!) 180/102  07/20/21 (!) 160/92  05/17/21 (!) 145/84   Pulse Readings from Last 3 Encounters:  08/03/21 (!) 51  07/20/21 62  05/17/21 71    Renal function: CrCl cannot be calculated (Patient's most recent lab result is older than the maximum 21 days allowed.).  Past Medical History:  Diagnosis Date   Hyperlipidemia    Hypertension     Current Outpatient Medications on File Prior to Visit  Medication Sig Dispense Refill   acetaminophen (TYLENOL) 500 MG tablet Take 2 tablets (1,000 mg total) by mouth every 6 (six) hours as needed. Fever, mild pain 30 tablet 0   amLODipine (NORVASC) 5 MG tablet Take 1 tablet (5 mg total) by mouth daily. 90 tablet 1   aspirin EC 81 MG tablet Take 81 mg by mouth daily.     cetirizine (ZYRTEC) 10 MG tablet Take  10 mg by mouth daily as needed for allergies.     clopidogrel (PLAVIX) 75 MG tablet Take 1 tablet (75 mg total) by mouth daily. 30 tablet 3   diphenhydrAMINE (BENADRYL) 25 MG tablet Take 25 mg by mouth every 6 (six) hours as needed for allergies.     ezetimibe (ZETIA) 10 MG tablet Take 1 tablet (10 mg total) by mouth daily. 90 tablet 3   fluticasone (FLONASE) 50 MCG/ACT nasal spray Place 1 spray into both nostrils daily as needed for allergies or rhinitis.     hydrochlorothiazide (MICROZIDE) 12.5 MG capsule Take 1 capsule (12.5 mg total) by mouth daily. 30 capsule 2   lisinopril (ZESTRIL) 40 MG tablet Take 1 tablet (40 mg total) by mouth daily. 90 tablet 3   rosuvastatin (CRESTOR) 20 MG tablet Take 1 tablet (20 mg total) by mouth daily. 90 tablet 3   traMADol (ULTRAM) 50 MG tablet Take 1 tablet (50 mg total) by mouth every 4 (four) hours as needed for moderate pain. 30 tablet 0   VIBERZI 100 MG TABS Take 100 mg by mouth daily.     No current facility-administered medications on file prior to visit.  No Known Allergies   Assessment/Plan:  1. Hypertension -  Patient BP in room 156/96 which is above goal of <130/80 and higher than home readings. Home readings have decreased since last visit and patient is tolerating amlodipine well. Blood pressure has reduced considerably some evenings however patient is asymptomatic. Will change lisinopril administration to evening along with amlodipine and continue HCTZ in morning to see if this gives him better round the clock coverage. If not, will consider increasing HCTZ or amlodipine at next visit.  Laural Golden, PharmD, BCACP, CDCES, CPP [address] [place], [place], [address] Phone: [phone], Fax: [phone]

## 2021-09-30 ENCOUNTER — Encounter: Payer: Self-pay | Admitting: Pharmacist

## 2021-10-01 ENCOUNTER — Other Ambulatory Visit: Payer: Self-pay | Admitting: Physician Assistant

## 2021-10-04 ENCOUNTER — Ambulatory Visit: Payer: BC Managed Care – PPO

## 2021-10-17 ENCOUNTER — Other Ambulatory Visit (HOSPITAL_BASED_OUTPATIENT_CLINIC_OR_DEPARTMENT_OTHER): Payer: Self-pay | Admitting: Family

## 2021-10-18 NOTE — Progress Notes (Unsigned)
Office Visit    Patient Name: Jesus Cunningham Date of Encounter: 10/19/2021  PCP:  No primary care provider on file.   [facility]  Cardiologist:  Little Ishikawa, MD  Advanced Practice Provider:  No care team member to display Electrophysiologist:  None   Chief Complaint    Jesus Cunningham is a 71 y.o. male with a hx of hypertension, hyperlipidemia, IBS, CAD s/p CABGx3 (LIMA-LAD, reverse SVG-PDA, reverse SVG-OM2) presents today for CAD, HTN follow up.   Past Medical History    Past Medical History:  Diagnosis Date   Hyperlipidemia    Hypertension    Past Surgical History:  Procedure Laterality Date   ANKLE FUSION     CORONARY ARTERY BYPASS GRAFT N/A 03/30/2021   Procedure: CORONARY ARTERY BYPASS GRAFTING (CABG) TIMES THREE USING LEFT INTERAL MAMMARY ARTERY, AND ENDOSCOPICALLY HARVESTED RIGHT GREATER SAPHENOUS VEIN.;  Surgeon: Corliss Skains, MD;  Location: [facility] OR;  Service: Open Heart Surgery;  Laterality: N/A;   ENDOVEIN HARVEST OF GREATER SAPHENOUS VEIN Right 03/30/2021   Procedure: ENDOVEIN HARVEST OF RIGHTGREATER SAPHENOUS VEIN;  Surgeon: Corliss Skains, MD;  Location: [facility] OR;  Service: Open Heart Surgery;  Laterality: Right;   LEFT HEART CATH AND CORONARY ANGIOGRAPHY N/A 03/28/2021   Procedure: LEFT HEART CATH AND CORONARY ANGIOGRAPHY;  Surgeon: Kathleene Hazel, MD;  Location: [facility] INVASIVE CV LAB;  Service: Cardiovascular;  Laterality: N/A;   TEE WITHOUT CARDIOVERSION N/A 03/30/2021   Procedure: TRANSESOPHAGEAL ECHOCARDIOGRAM (TEE);  Surgeon: Corliss Skains, MD;  Location: [facility] OR;  Service: Open Heart Surgery;  Laterality: N/A;    Allergies  No Known Allergies  History of Present Illness    Jesus Cunningham is a 71 y.o. male with a hx of hypertension, hyperlipidemia, IBS, CAD s/p CABGx3 (LIMA-LAD, reverse SVG-PDA, reverse SVG-OM2) last seen 09/06/21 by pharmacy team.  He presented to the ED 03/27/2021 due to  exertional chest pressure over the previous 10 days.  EKG with diffuse ST segment depression.  At bedtime troponin 33 ? 48 ? 73 ? 55.  LHC with 99% distal left main stenosis with extension into the ostia of LAD and circumflex.  Additionally noted long segment 40% stenosis of mid RCA and 99% distal RCA stenosis just before the bifurcation of PDA and PL.  Echo 03/28/2021 LVEF 60 to 65%, grade 1 diastolic dysfunction, no significant valvular abnormalities.  He underwent CABG X3 03/30/2021 by Dr. [name]. He developed atrial fibrillation post operatively and was treated with Amiodarone.  He was additionally discharged on Plavix due to presentation with NSTEMI.  He required Lasix for volume overload.  Amlodipine, meloxicam, lisinopril-HCTZ were discontinued.  At follow up 04/26/21 Metoprolol stopped due to bradycardia. Lisinopril increased for BP control. Transitioned to Rosuvastatin. At follow up with Dr. Bjorn Pippin 07/20/21 BP noted to still be escalated. Monitor 07/2021 with predominantly NSR, one 4 beat run NSVT, and 7 runs of SVT which were asymptomatic. Since then evaluated by pharmacy team for hypertension and started on Amlodipine and HCTZ.   Presents today for follow up with his wife. 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 Reports no shortness of breath nor dyspnea on exertion. Reports no chest pain, pressure, or tightness. No edema, orthopnea, PND. Reports no palpitations.  Has been back to working out on [employer].   EKGs/Labs/Other Studies Reviewed:   The following studies were reviewed  today:  LHC 03/28/21:   Prox RCA to Dist RCA lesion is 40% stenosed.   Dist RCA lesion is 99% stenosed.   Mid LM to Ost LAD lesion is 99% stenosed with 99% stenosed side branch in Ost Cx.   Severe, heavily calcified distal left main stenosis The LAD and Circumflex are relatively disease free beyond the ostial segments and appear  to be good targets for bypass The RCA is heavily calcified with moderate mid stenosis. Severe distal RCA stenosis Normal LV filling pressure   Recommendations: Will admit to telemetry here at [facility]. Will resume IV heparin 4 hours post sheath pull. CT surgery consult for CABG. Continue ASA, statin and beta blocker.    Echo 03/28/21: 1. Left ventricular ejection fraction, by estimation, is 60 to 65%. The  left ventricle has normal function. Left ventricular endocardial border  not optimally defined to evaluate regional wall motion. Left ventricular  diastolic parameters are consistent  with Grade I diastolic dysfunction (impaired relaxation).   2. Right ventricular systolic function is normal. The right ventricular  size is normal.   3. The mitral valve is normal in structure. No evidence of mitral valve  regurgitation.   4. The aortic valve is grossly normal. Aortic valve regurgitation is not  visualized.   5. Not well visualized.   EKG:  No EKG today.   Recent Labs: 04/26/2021: ALT 18; Hemoglobin 12.5; Platelets 379 08/03/2021: BUN 11; Creatinine, Ser 0.91; Magnesium 2.1; Potassium 4.5; Sodium 135  Recent Lipid Panel    Component Value Date/Time   CHOL 175 08/03/2021 0925   TRIG 86 08/03/2021 0925   HDL 78 08/03/2021 0925   CHOLHDL 2.2 08/03/2021 0925   CHOLHDL 2.1 03/28/2021 0230   VLDL 12 03/28/2021 0230   LDLCALC 81 08/03/2021 0925   LDLDIRECT 84 04/26/2021 1458   Home Medications   Current Meds  Medication Sig   acetaminophen (TYLENOL) 500 MG tablet Take 2 tablets (1,000 mg total) by mouth every 6 (six) hours as needed. Fever, mild pain   amLODipine (NORVASC) 5 MG tablet Take 1 tablet (5 mg total) by mouth daily.   aspirin EC 81 MG tablet Take 81 mg by mouth daily.   cetirizine (ZYRTEC) 10 MG tablet Take 10 mg by mouth daily as needed for allergies.   clopidogrel (PLAVIX) 75 MG tablet Take 1 tablet (75 mg total) by mouth daily.   diphenhydrAMINE (BENADRYL) 25 MG tablet  Take 25 mg by mouth every 6 (six) hours as needed for allergies.   ezetimibe (ZETIA) 10 MG tablet Take 1 tablet (10 mg total) by mouth daily.   fluticasone (FLONASE) 50 MCG/ACT nasal spray Place 1 spray into both nostrils daily as needed for allergies or rhinitis.   hydrochlorothiazide (MICROZIDE) 12.5 MG capsule TAKE (1) CAPSULE BY MOUTH ONCE DAILY.   lisinopril (ZESTRIL) 40 MG tablet Take 1 tablet (40 mg total) by mouth daily.   rosuvastatin (CRESTOR) 20 MG tablet Take 1 tablet (20 mg total) by mouth daily.   traMADol (ULTRAM) 50 MG tablet Take 1 tablet (50 mg total) by mouth every 4 (four) hours as needed for moderate pain.   VIBERZI 100 MG TABS Take 100 mg by mouth daily.   zolpidem (AMBIEN) 10 MG tablet Take 10 mg by mouth at bedtime as needed.     Review of Systems      All other systems reviewed and are otherwise negative except as noted above.  Physical Exam    VS:  BP 122/78 (  BP Location: Left Arm, Patient Position: Sitting, Cuff Size: Normal)   Pulse (!) 53   Ht 6' (1.829 m)   Wt 217 lb (98.4 kg)   SpO2 98%   BMI 29.43 kg/m  , BMI Body mass index is 29.43 kg/m.  Wt Readings from Last 3 Encounters:  10/19/21 217 lb (98.4 kg)  09/06/21 213 lb 6.4 oz (96.8 kg)  08/03/21 213 lb 9.6 oz (96.9 kg)    GEN: Well nourished, well developed, in no acute distress. HEENT: normal. Neck: Supple, no JVD, carotid bruits, or masses. Cardiac: RRR, no murmurs, rubs, or gallops. No clubbing, cyanosis, edema.  Radials/PT 2+ and equal bilaterally.  Respiratory:  Respirations regular and unlabored, clear to auscultation bilaterally. GI: Soft, nontender, nondistended. MS: No deformity or atrophy. Skin: Warm and dry, no rash. Neuro:  Strength and sensation are intact. Psych: Normal affect.  Assessment & Plan    CAD s/p NSTEMI and CABGx3 03/2021 - Echo 03/2021 normal biventricular function, no significant valvular disease. Postoperative atrial fibrillation - Metoprolol and Amiodarone have  since been discontinued with no recurrent atrial fibrillation. Stable with no anginal symptoms. No indication for ischemic evaluation.  GDMT includes Plavix, Aspirin, statin. No BB due to bradycardia. Heart healthy diet and regular cardiovascular exercise encouraged.    HTN -  BP well controlled. Continue current antihypertensive regimen Lisinopril 40mg  daily, HCTZ 12.5mg  QD, Amlodipine 5mg  QD.   HLD, LDL goal <70 - 08/03/21 total cholesterol 175, triglycerides 86, HDL 78, LDL 81. Zetia was added. Had labs at primary care after addition of Zetia, we will request records   Disposition: Follow up in 3 month(s)with , MD or APP.  Signed, 08/05/21, NP 10/19/2021, 9:44 AM [facility]

## 2021-10-19 ENCOUNTER — Ambulatory Visit (INDEPENDENT_AMBULATORY_CARE_PROVIDER_SITE_OTHER): Payer: BC Managed Care – PPO | Admitting: Family

## 2021-10-19 ENCOUNTER — Encounter (HOSPITAL_BASED_OUTPATIENT_CLINIC_OR_DEPARTMENT_OTHER): Payer: Self-pay | Admitting: Family

## 2021-10-19 VITALS — BP 122/78 | HR 53 | Ht 72.0 in | Wt 217.0 lb

## 2021-10-19 DIAGNOSIS — E785 Hyperlipidemia, unspecified: Secondary | ICD-10-CM

## 2021-10-19 DIAGNOSIS — I10 Essential (primary) hypertension: Secondary | ICD-10-CM | POA: Diagnosis not present

## 2021-10-19 DIAGNOSIS — I25118 Atherosclerotic heart disease of native coronary artery with other forms of angina pectoris: Secondary | ICD-10-CM | POA: Diagnosis not present

## 2021-10-19 MED ORDER — CLOPIDOGREL BISULFATE 75 MG PO TABS: 75.0000 mg | ORAL_TABLET | Freq: Every day | ORAL | 3 refills | Status: DC

## 2021-10-19 MED ORDER — HYDROCHLOROTHIAZIDE 12.5 MG PO CAPS: 12.5000 mg | ORAL_CAPSULE | Freq: Every day | ORAL | 3 refills | Status: DC

## 2021-10-19 NOTE — Patient Instructions (Signed)
Medication Instructions:  Continue your current medications.   *If you need a refill on your cardiac medications before your next appointment, please call your pharmacy*  Lab Work: None ordered today.   Testing/Procedures: None ordered today.   Follow-Up: At [facility], you and your health needs are our priority.  As part of our continuing mission to provide you with exceptional heart care, we have created designated Provider Care Teams.  These Care Teams include your primary Cardiologist (physician) and Advanced Practice Providers (APPs -  Physician Assistants and Nurse Practitioners) who all work together to provide you with the care you need, when you need it.  We recommend signing up for the patient portal called "MyChart".  Sign up information is provided on this After Visit Summary.  MyChart is used to connect with patients for Virtual Visits (Telemedicine).  Patients are able to view lab/test results, encounter notes, upcoming appointments, etc.  Non-urgent messages can be sent to your provider as well.   To learn more about what you can do with MyChart, go to [email or web address].    Your next appointment:   3-4 month(s)  The format for your next appointment:   In Person  Provider:   Little Ishikawa, MD    Other Instructions  Heart Healthy Diet Recommendations: A low-salt diet is recommended. Meats should be grilled, baked, or boiled. Avoid fried foods. Focus on lean protein sources like fish or chicken with vegetables and fruits. The American Heart Association is a Chief Technology Officer!  American Heart Association Diet and Lifeystyle Recommendations   Exercise recommendations: The American Heart Association recommends 150 minutes of moderate intensity exercise weekly. Try 30 minutes of moderate intensity exercise 4-5 times per week. This could include walking, jogging, or swimming.  Important Information About Sugar

## 2021-10-24 ENCOUNTER — Other Ambulatory Visit (HOSPITAL_BASED_OUTPATIENT_CLINIC_OR_DEPARTMENT_OTHER): Payer: Self-pay | Admitting: Family

## 2021-10-24 DIAGNOSIS — I10 Essential (primary) hypertension: Secondary | ICD-10-CM

## 2021-10-25 ENCOUNTER — Other Ambulatory Visit: Payer: Self-pay | Admitting: Physician Assistant

## 2022-01-26 ENCOUNTER — Other Ambulatory Visit: Payer: Self-pay | Admitting: Cardiology

## 2022-01-26 DIAGNOSIS — I10 Essential (primary) hypertension: Secondary | ICD-10-CM

## 2022-02-09 ENCOUNTER — Ambulatory Visit: Payer: BC Managed Care – PPO | Attending: Cardiology | Admitting: Cardiology

## 2022-02-09 ENCOUNTER — Encounter: Payer: Self-pay | Admitting: Cardiology

## 2022-02-09 VITALS — BP 140/90 | HR 53 | Ht 72.0 in | Wt 214.2 lb

## 2022-02-09 DIAGNOSIS — Z951 Presence of aortocoronary bypass graft: Secondary | ICD-10-CM

## 2022-02-09 DIAGNOSIS — I2510 Atherosclerotic heart disease of native coronary artery without angina pectoris: Secondary | ICD-10-CM

## 2022-02-09 DIAGNOSIS — E785 Hyperlipidemia, unspecified: Secondary | ICD-10-CM | POA: Diagnosis not present

## 2022-02-09 DIAGNOSIS — I10 Essential (primary) hypertension: Secondary | ICD-10-CM | POA: Diagnosis not present

## 2022-02-09 MED ORDER — CLOPIDOGREL BISULFATE 75 MG PO TABS: 75.0000 mg | ORAL_TABLET | Freq: Every day | ORAL | 3 refills | Status: DC

## 2022-02-09 NOTE — Patient Instructions (Signed)
Medication Instructions:  Your physician recommends that you continue on your current medications as directed. Please refer to the Current Medication list given to you today.  In [facility] to stop Plavix.   *If you need a refill on your cardiac medications before your next appointment, please call your pharmacy*   Lab Work: BMET, Mag, Lipid today  If you have labs (blood work) drawn today and your tests are completely normal, you will receive your results only by: [place] (if you have MyChart) OR A paper copy in the mail If you have any lab test that is abnormal or we need to change your treatment, we will call you to review the results.   Follow-Up: At [facility] you and your health needs are our priority.  As part of our continuing mission to provide you with exceptional heart care, we have created designated Provider Care Teams.  These Care Teams include your primary Cardiologist (physician) and Advanced Practice Providers (APPs -  Physician Assistants and Nurse Practitioners) who all work together to provide you with the care you need, when you need it.  We recommend signing up for the patient portal called "MyChart".  Sign up information is provided on this After Visit Summary.  MyChart is used to connect with patients for Virtual Visits (Telemedicine).  Patients are able to view lab/test results, encounter notes, upcoming appointments, etc.  Non-urgent messages can be sent to your provider as well.   To learn more about what you can do with MyChart, go to [email or web address].    Your next appointment:   6 month(s)  The format for your next appointment:   In Person  Provider:   Donato Heinz, MD     Other Instructions Please check your blood pressure at home twice daily, write it down.  Call the office or send message via Mychart with the readings in 1 week for Dr. Gardiner Rhyme to review.

## 2022-02-09 NOTE — Progress Notes (Signed)
Cardiology Office Note:    Date:  02/09/2022   ID:  Jesus Cunningham, DOB 01/08/1951, MRN [id number]  PCP:  No primary care provider on file.  Cardiologist:  Little Ishikawa, MD  Electrophysiologist:  None   Referring MD: No ref. provider found   Chief Complaint  Patient presents with   Coronary Artery Disease    History of Present Illness:    Jesus Cunningham is a 71 y.o. male with a hx of CAD status post CABG x3 (LIMA-LAD, SVG-PDA, SVG-OM2), hypertension, hyperlipidemia who presents for follow-up.  He he was admitted 03/2021 with NSTEMI, found to have severe multivessel CAD.  Echocardiogram 03/28/2021 showed normal biventricular function, grade 1 diastolic dysfunction, no significant valvular disease.  Zio patch x14 days on 08/12/2021 showed 1 episode of NSVT lasting 4 beats.  Since last clinic visit, he reports that he has been doing well.  Denies any chest pain, dyspnea, lightheadedness, syncope, lower extremity edema, or palpitations.  Has been exercising regularly, will do weight training/cardio class for about 1 hour/week at a gym and then also goes to walk to the gym on weekends.  Denies any exertional symptoms.  He is taking DAPT, denies any bleeding issues.  His BP has been controlled when he checks it at home.    Past Medical History:  Diagnosis Date   Hyperlipidemia    Hypertension     Past Surgical History:  Procedure Laterality Date   ANKLE FUSION     CORONARY ARTERY BYPASS GRAFT N/A 03/30/2021   Procedure: CORONARY ARTERY BYPASS GRAFTING (CABG) TIMES THREE USING LEFT INTERAL MAMMARY ARTERY, AND ENDOSCOPICALLY HARVESTED RIGHT GREATER SAPHENOUS VEIN.;  Surgeon: Corliss Skains, MD;  Location: [facility] OR;  Service: Open Heart Surgery;  Laterality: N/A;   ENDOVEIN HARVEST OF GREATER SAPHENOUS VEIN Right 03/30/2021   Procedure: ENDOVEIN HARVEST OF RIGHTGREATER SAPHENOUS VEIN;  Surgeon: Corliss Skains, MD;  Location: [facility] OR;  Service: Open Heart Surgery;   Laterality: Right;   LEFT HEART CATH AND CORONARY ANGIOGRAPHY N/A 03/28/2021   Procedure: LEFT HEART CATH AND CORONARY ANGIOGRAPHY;  Surgeon: Kathleene Hazel, MD;  Location: [facility] INVAS[facility];  Service: Cardiovascular;  Laterality: N/A;   TEE WITHOUT CARDIOVERSION N/A 03/30/2021   Procedure: TRANSESOPHAGEAL ECHOCARDIOGRAM (TEE);  Surgeon: Corliss Skains, MD;  Location: [facility] OR;  Service: Open Heart Surgery;  Laterality: N/A;    Current Medications: Current Meds  Medication Sig   acetaminophen (TYLENOL) 500 MG tablet Take 2 tablets (1,000 mg total) by mouth every 6 (six) hours as needed. Fever, mild pain   amLODipine (NORVASC) 5 MG tablet TAKE 1 TABLET (5 MG TOTAL) BY MOUTH DAILY.   aspirin EC 81 MG tablet Take 81 mg by mouth daily.   cetirizine (ZYRTEC) 10 MG tablet Take 10 mg by mouth daily as needed for allergies.   diphenhydrAMINE (BENADRYL) 25 MG tablet Take 25 mg by mouth every 6 (six) hours as needed for allergies.   ezetimibe (ZETIA) 10 MG tablet Take 1 tablet (10 mg total) by mouth daily.   fluticasone (FLONASE) 50 MCG/ACT nasal spray Place 1 spray into both nostrils daily as needed for allergies or rhinitis.   hydrochlorothiazide (MICROZIDE) 12.5 MG capsule Take 1 capsule (12.5 mg total) by mouth daily.   lisinopril (ZESTRIL) 40 MG tablet Take 1 tablet (40 mg total) by mouth daily.   rosuvastatin (CRESTOR) 20 MG tablet Take 1 tablet (20 mg total) by mouth daily.   VIBERZI 100 MG TABS Take  100 mg by mouth daily.   zolpidem (AMBIEN) 10 MG tablet Take 10 mg by mouth at bedtime as needed.   [DISCONTINUED] clopidogrel (PLAVIX) 75 MG tablet Take 1 tablet (75 mg total) by mouth daily.     Allergies:   Patient has no known allergies.   Social History   Socioeconomic History   Marital status: Married    Spouse name: Not on file   Number of children: Not on file   Years of education: Not on file   Highest education level: Not on file  Occupational History   Not on file   Tobacco Use   Smoking status: Never   Smokeless tobacco: Former    Types: Chew    Quit date: 10/11/1981  Vaping Use   Vaping Use: Never used  Substance and Sexual Activity   Alcohol use: Yes    Alcohol/week: 4.0 standard drinks of alcohol    Types: 4 Glasses of wine per week   Drug use: No   Sexual activity: Not on file  Other Topics Concern   Not on file  Social History Narrative   Not on file   Social Determinants of Health   Financial Resource Strain: Not on file  Food Insecurity: Not on file  Transportation Needs: Not on file  Physical Activity: Not on file  Stress: Not on file  Social Connections: Not on file     Family History: The patient's family history includes Diabetes in his father; Heart failure in his father; Hypertension in his brother; Stroke in his mother.  ROS:   Please see the history of present illness.     All other systems reviewed and are negative.  EKGs/Labs/Other Studies Reviewed:    The following studies were reviewed today:   EKG:   02/09/22: Sinus bradycardia, rate 53, no ST abnormalities  Recent Labs: 04/26/2021: ALT 18; Hemoglobin 12.5; Platelets 379 08/03/2021: BUN 11; Creatinine, Ser 0.91; Magnesium 2.1; Potassium 4.5; Sodium 135  Recent Lipid Panel    Component Value Date/Time   CHOL 175 08/03/2021 0925   TRIG 86 08/03/2021 0925   HDL 78 08/03/2021 0925   CHOLHDL 2.2 08/03/2021 0925   CHOLHDL 2.1 03/28/2021 0230   VLDL 12 03/28/2021 0230   LDLCALC 81 08/03/2021 0925   LDLDIRECT 84 04/26/2021 1458    Physical Exam:    VS:  BP (!) 140/90   Pulse (!) 53   Ht 6' (1.829 m)   Wt 214 lb 3.2 oz (97.2 kg)   SpO2 97%   BMI 29.05 kg/m     Wt Readings from Last 3 Encounters:  02/09/22 214 lb 3.2 oz (97.2 kg)  10/19/21 217 lb (98.4 kg)  09/06/21 213 lb 6.4 oz (96.8 kg)     GEN:  Well nourished, well developed in no acute distress HEENT: Normal NECK: No JVD; No carotid bruits LYMPHATICS: No lymphadenopathy CARDIAC: RRR,  no murmurs, rubs, gallops RESPIRATORY:  Clear to auscultation without rales, wheezing or rhonchi  ABDOMEN: Soft, non-tender, non-distended MUSCULOSKELETAL:  No edema; No deformity  SKIN: Warm and dry NEUROLOGIC:  Alert and oriented x 3 PSYCHIATRIC:  Normal affect   ASSESSMENT:    1. Coronary artery disease involving native coronary artery of native heart without angina pectoris   2. S/P CABG (coronary artery bypass graft)   3. Essential hypertension   4. Hyperlipidemia, unspecified hyperlipidemia type     PLAN:     CAD: status post CABG x3 (LIMA-LAD, SVG-PDA, SVG-OM2) on 03/30/2021.  He  he was admitted 03/2021 with NSTEMI, found to have severe multivessel CAD.  Echocardiogram 03/28/2021 showed normal biventricular function, grade 1 diastolic dysfunction, no significant valvular disease. -Continue aspirin 81 mg daily.  Can discontinue Plavix 75 mg daily in December, will be 12 months since MI -Continue rosuvastatin 20 mg daily -Metoprolol was discontinued due to bradycardia  Syncope: Occurred in January 2023, suspect due to dehydration.  Zio patch x14 days on 08/12/2021 showed 1 episode of NSVT lasting 4 beats.  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  No evidence of recurrence  Hypertension: On lisinopril 40 mg daily, amlodipine 5 mg daily, HCTZ 12.5 mg daily.  BP mildly elevated in clinic today, recommend checking BP twice daily for next week and calling with results  Hyperlipidemia: On rosuvastatin 20 mg daily.  LDL 81 04/2021.  Zetia 10 mg daily added.  Check lipid panel  RTC in 6 months  Medication Adjustments/Labs and Tests Ordered: Current medicines are reviewed at length with the patient today.  Concerns regarding medicines are outlined above.  Orders Placed This Encounter  Procedures   Basic metabolic panel   Magnesium   Lipid panel   EKG 12-Lead   Meds ordered  this encounter  Medications   clopidogrel (PLAVIX) 75 MG tablet    Sig: Take 1 tablet (75 mg total) by mouth daily. Ok to stop in December 2023 per Dr. Bjorn Pippin    Dispense:  90 tablet    Refill:  3    Patient Instructions  Medication Instructions:  Your physician recommends that you continue on your current medications as directed. Please refer to the Current Medication list given to you today.  In [facility] to stop Plavix.   *If you need a refill on your cardiac medications before your next appointment, please call your pharmacy*   Lab Work: BMET, Mag, Lipid today  If you have labs (blood work) drawn today and your tests are completely normal, you will receive your results only by: MyChart Message (if you have MyChart) OR A paper copy in the mail If you have any lab test that is abnormal or we need to change your treatment, we will call you to review the results.   Follow-Up: At [facility], you and your health needs are our priority.  As part of our continuing mission to provide you with exceptional heart care, we have created designated Provider Care Teams.  These Care Teams include your primary Cardiologist (physician) and Advanced Practice Providers (APPs -  Physician Assistants and Nurse Practitioners) who all work together to provide you with the care you need, when you need it.  We recommend signing up for the patient portal called "MyChart".  Sign up information is provided on this After Visit Summary.  MyChart is used to connect with patients for Virtual Visits (Telemedicine).  Patients are able to view lab/test results, encounter notes, upcoming appointments, etc.  Non-urgent messages can be sent to your provider as well.   To learn more about what you can do with MyChart, go to [email or web address].    Your next appointment:   6 month(s)  The format for your next appointment:   In Person  Provider:   Little Ishikawa, MD     Other  Instructions Please check your blood pressure at home twice daily, write it down.  Call the office or send message via Mychart with the readings in 1 week for Dr.  Gardiner Rhyme to review.         Signed, Donato Heinz, MD  02/09/2022 12:41 PM    [place]

## 2022-02-10 LAB — BASIC METABOLIC PANEL
BUN/Creatinine Ratio: 13 (ref 10–24)
BUN: 10 mg/dL (ref 8–27)
CO2: 21 mmol/L (ref 20–29)
Calcium: 9.1 mg/dL (ref 8.6–10.2)
Chloride: 102 mmol/L (ref 96–106)
Creatinine, Ser: 0.75 mg/dL — ABNORMAL LOW (ref 0.76–1.27)
Glucose: 91 mg/dL (ref 70–99)
Potassium: 4.4 mmol/L (ref 3.5–5.2)
Sodium: 137 mmol/L (ref 134–144)
eGFR: 96 mL/min/{1.73_m2} (ref >59)

## 2022-02-10 LAB — LIPID PANEL
Chol/HDL Ratio: 2.2 ratio (ref 0.0–5.0)
Cholesterol, Total: 123 mg/dL (ref 100–199)
HDL: 55 mg/dL (ref >39)
LDL Chol Calc (NIH): 52 mg/dL (ref 0–99)
Triglycerides: 79 mg/dL (ref 0–149)
VLDL Cholesterol Cal: 16 mg/dL (ref 5–40)

## 2022-02-10 LAB — MAGNESIUM: Magnesium: 2 mg/dL (ref 1.6–2.3)

## 2022-07-27 ENCOUNTER — Other Ambulatory Visit: Payer: Self-pay

## 2022-07-27 MED ORDER — EZETIMIBE 10 MG PO TABS: 10.0000 mg | ORAL_TABLET | Freq: Every day | ORAL | 1 refills | Status: DC

## 2022-08-01 ENCOUNTER — Telehealth: Payer: Self-pay | Admitting: Gastroenterology

## 2022-08-01 DIAGNOSIS — D509 Iron deficiency anemia, unspecified: Secondary | ICD-10-CM

## 2022-08-01 NOTE — Telephone Encounter (Signed)
Good afternoon Dr. Barron Alvine  The following patient is requesting a transfer to [place] from [facility] for a colonoscopy. He is not please with the facility or the care received. His records are available on the Media tab in his chart. Please review and advise of scheduling  Thank you

## 2022-08-02 ENCOUNTER — Encounter: Payer: Self-pay | Admitting: Gastroenterology

## 2022-08-03 NOTE — Telephone Encounter (Signed)
Lab orders in epic. Letter sent to patient.

## 2022-08-04 ENCOUNTER — Telehealth: Payer: Self-pay | Admitting: Gastroenterology

## 2022-08-04 DIAGNOSIS — D509 Iron deficiency anemia, unspecified: Secondary | ICD-10-CM

## 2022-08-04 NOTE — Telephone Encounter (Signed)
Patient sent MyChart message asking if he can go to [facility] in [place], [place] to get bloodwork done and have them forward the results to Dr. Barron Alvine for his upcoming NP appointment on 10/17/22.  He said he lives 15 minutes from the [facility] in [place] but it would take him an hour and fifteen minutes to come to the lab in [place].  Please call patient and advise.  Thank you.

## 2022-08-04 NOTE — Telephone Encounter (Signed)
Lab orders updated to be collected at [facility]. Lab orders have been mailed to patient to take to [place] at his convenience. Patient notified via MyChart message.

## 2022-08-07 ENCOUNTER — Other Ambulatory Visit (HOSPITAL_BASED_OUTPATIENT_CLINIC_OR_DEPARTMENT_OTHER): Payer: Self-pay | Admitting: Family

## 2022-08-07 DIAGNOSIS — I10 Essential (primary) hypertension: Secondary | ICD-10-CM

## 2022-08-07 NOTE — Telephone Encounter (Signed)
Rx(s) sent to pharmacy electronically.  

## 2022-08-09 NOTE — Telephone Encounter (Signed)
Patient reviewed MyChart message. Last read by Wallace Keller at  1:30 PM on 08/04/2022.

## 2022-10-06 LAB — CBC WITH DIFFERENTIAL/PLATELET
Basophils Absolute: 0 10*3/uL (ref 0.0–0.2)
Basos: 1 %
EOS (ABSOLUTE): 0.2 10*3/uL (ref 0.0–0.4)
Eos: 3 %
Hematocrit: 43.7 % (ref 37.5–51.0)
Hemoglobin: 15.1 g/dL (ref 13.0–17.7)
Immature Grans (Abs): 0 10*3/uL (ref 0.0–0.1)
Immature Granulocytes: 0 %
Lymphocytes Absolute: 1.3 10*3/uL (ref 0.7–3.1)
Lymphs: 29 %
MCH: 31.5 pg (ref 26.6–33.0)
MCHC: 34.6 g/dL (ref 31.5–35.7)
MCV: 91 fL (ref 79–97)
Monocytes Absolute: 0.5 10*3/uL (ref 0.1–0.9)
Monocytes: 11 %
Neutrophils Absolute: 2.6 10*3/uL (ref 1.4–7.0)
Neutrophils: 56 %
Platelets: 287 10*3/uL (ref 150–450)
RBC: 4.79 x10E6/uL (ref 4.14–5.80)
RDW: 14.1 % (ref 11.6–15.4)
WBC: 4.6 10*3/uL (ref 3.4–10.8)

## 2022-10-06 LAB — IRON,TIBC AND FERRITIN PANEL
Ferritin: 66 ng/mL (ref 30–400)
Iron Saturation: 32 % (ref 15–55)
Iron: 106 ug/dL (ref 38–169)
Total Iron Binding Capacity: 329 ug/dL (ref 250–450)
UIBC: 223 ug/dL (ref 111–343)

## 2022-10-17 ENCOUNTER — Encounter: Payer: BC Managed Care – PPO | Admitting: Gastroenterology

## 2022-10-17 ENCOUNTER — Ambulatory Visit: Payer: BC Managed Care – PPO | Admitting: Gastroenterology

## 2022-10-17 ENCOUNTER — Encounter: Payer: Self-pay | Admitting: Gastroenterology

## 2022-10-17 VITALS — BP 126/70 | HR 54 | Ht 72.0 in | Wt 214.0 lb

## 2022-10-17 DIAGNOSIS — Z86010 Personal history of colonic polyps: Secondary | ICD-10-CM

## 2022-10-17 DIAGNOSIS — Z800 Family history of malignant neoplasm of digestive organs: Secondary | ICD-10-CM

## 2022-10-17 DIAGNOSIS — Q438 Other specified congenital malformations of intestine: Secondary | ICD-10-CM

## 2022-10-17 DIAGNOSIS — Z951 Presence of aortocoronary bypass graft: Secondary | ICD-10-CM

## 2022-10-17 DIAGNOSIS — K5730 Diverticulosis of large intestine without perforation or abscess without bleeding: Secondary | ICD-10-CM

## 2022-10-17 MED ORDER — NA SULFATE-K SULFATE-MG SULF 17.5-3.13-1.6 GM/177ML PO SOLN: 1.0000 | Freq: Once | ORAL | 0 refills | Status: AC

## 2022-10-17 NOTE — Patient Instructions (Signed)
You have been scheduled for a colonoscopy. Please follow written instructions given to you at your visit today.   Please pick up your prep supplies at the pharmacy within the next 1-3 days.  If you use inhalers (even only as needed), please bring them with you on the day of your procedure.  DO NOT TAKE 7 DAYS PRIOR TO TEST- Trulicity (dulaglutide) Ozempic, Wegovy (semaglutide) Mounjaro (tirzepatide) Bydureon Bcise (exanatide extended release)  DO NOT TAKE 1 DAY PRIOR TO YOUR TEST Rybelsus (semaglutide) Adlyxin (lixisenatide) Victoza (liraglutide) Byetta (exanatide) ___________________________________________________________________________    _______________________________________________________  If your blood pressure at your visit was 140/90 or greater, please contact your primary care physician to follow up on this.  _______________________________________________________  If you are age 4 or younger, your body mass index should be between 19-25. Your Body mass index is 29.02 kg/m. If this is out of the aformentioned range listed, please consider follow up with your Primary Care Provider.   __________________________________________________________  The [place] [facility] providers would like to encourage you to use [facility] to communicate with providers for non-urgent requests or questions.  Due to long hold times on the telephone, sending your provider a message by [facility] may be a faster and more efficient way to get a response.  Please allow 48 business hours for a response.  Please remember that this is for non-urgent requests.    Thank you for choosing me and [facility].  Vito Cirigliano, D.O.

## 2022-10-17 NOTE — Progress Notes (Signed)
Chief Complaint: Colon cancer screening, colon polyp surveillance, family history of colon cancer.  Medication management   Referring Provider:     Oneal Grout, FNP    HPI:     Jesus Cunningham is a 72 y.o. male with a history of HTN, HLD, CAD with CABG 3V 2022, referred to the Gastroenterology Clinic for evaluation of ongoing colon cancer screening and ongoing polyp surveillance.  Family history notable for mother with colon cancer at age 83.  Previously followed with GI in [place] for IDA, history of colon polyps, IBS-D (treated with Viberzi).  Was last seen there on 03/22/2020, and now presents to transfer his ongoing GI care at [facility].   No longer taking Plavix. Stopped in 03/2022.   He is otherwise without any GI symptoms.  IBS-D well-controlled with Viberzi.  No reflux symptoms.  No overt bleeding.  Recent CBC and iron panel were normal.  Endoscopic History: - 11/2016: 2 small 2-4 mm distal ascending colon adenomas, extremely tortuous redundant colon, diverticulosis. Repeat in 5 years  - 11/2016: EGD: 6 cm hiatal hernia, mild chronic gastritis  - 01/2017: VCE: Normal       Latest Ref Rng & Units 10/05/2022   11:10 AM 04/26/2021    2:58 PM 04/01/2021    4:01 AM  CBC  WBC 3.4 - 10.8 x10E3/uL 4.6  6.0  8.2   Hemoglobin 13.0 - 17.7 g/dL 16.1  09.6  9.4   Hematocrit 37.5 - 51.0 % 43.7  36.2  28.1   Platelets 150 - 450 x10E3/uL 287  379  161     10/05/2022: Normal iron panel  Past Medical History:  Diagnosis Date   Colon polyps    Hyperlipidemia    Hypertension      Past Surgical History:  Procedure Laterality Date   ANKLE FUSION     CORONARY ARTERY BYPASS GRAFT N/A 03/30/2021   Procedure: CORONARY ARTERY BYPASS GRAFTING (CABG) TIMES THREE USING LEFT INTERAL MAMMARY ARTERY, AND ENDOSCOPICALLY HARVESTED RIGHT GREATER SAPHENOUS VEIN.;  Surgeon: Corliss Skains, MD;  Location: [facility] OR;  Service: Open Heart Surgery;  Laterality: N/A;   ENDOVEIN HARVEST  OF GREATER SAPHENOUS VEIN Right 03/30/2021   Procedure: ENDOVEIN HARVEST OF RIGHTGREATER SAPHENOUS VEIN;  Surgeon: Corliss Skains, MD;  Location: [facility] OR;  Service: Open Heart Surgery;  Laterality: Right;   LEFT HEART CATH AND CORONARY ANGIOGRAPHY N/A 03/28/2021   Procedure: LEFT HEART CATH AND CORONARY ANGIOGRAPHY;  Surgeon: Kathleene Hazel, MD;  [facility]: [facility] INVASIVE [facility] LAB;  Service: Cardiovascular;  Laterality: N/A;   TEE WITHOUT CARDIOVERSION N/A 03/30/2021   Procedure: TRANSESOPHAGEAL ECHOCARDIOGRAM (TEE);  Surgeon: Corliss Skains, MD;  Location[facility] OR;  Service: Open Heart Surgery;  Laterality: N/A;   Family History  Problem Relation Age of Onset   Stroke Mother    Colon cancer Mother    Heart failure Father    Diabetes Father    Hypertension Brother    Stomach cancer Neg Hx    Esophageal cancer Neg Hx    Social History   Tobacco Use   Smoking status: Never   Smokeless tobacco: Former    Types: Chew    Quit date: 10/11/1981  Vaping Use   Vaping Use: Never used  Substance Use Topics   Alcohol use: Yes    Alcohol/week: 4.0 standard drinks of alcohol    Types: 4 Glasses of wine per week  Comment: a glass a wine 2 a week   Drug use: No   Current Outpatient Medications  Medication Sig Dispense Refill   acetaminophen (TYLENOL) 500 MG tablet Take 2 tablets (1,000 mg total) by mouth every 6 (six) hours as needed. Fever, mild pain 30 tablet 0   amLODipine (NORVASC) 5 MG tablet TAKE 1 TABLET (5 MG TOTAL) BY MOUTH DAILY. 90 tablet 3   aspirin EC 81 MG tablet Take 81 mg by mouth daily.     cetirizine (ZYRTEC) 10 MG tablet Take 10 mg by mouth daily as needed for allergies.     diphenhydrAMINE (BENADRYL) 25 MG tablet Take 25 mg by mouth every 6 (six) hours as needed for allergies.     ezetimibe (ZETIA) 10 MG tablet Take 1 tablet (10 mg total) by mouth daily. 90 tablet 1   fluticasone (FLONASE) 50 MCG/ACT nasal spray Place 1 spray into both nostrils daily as needed  for allergies or rhinitis.     hydrochlorothiazide (MICROZIDE) 12.5 MG capsule Take 1 capsule (12.5 mg total) by mouth daily. 90 capsule 3   lisinopril (ZESTRIL) 40 MG tablet TAKE 1 TABLET BY MOUTH ONCE DAILY. 90 tablet 1   rosuvastatin (CRESTOR) 20 MG tablet Take 1 tablet (20 mg total) by mouth daily. 90 tablet 3   VIBERZI 100 MG TABS Take 100 mg by mouth daily.     zolpidem (AMBIEN) 10 MG tablet Take 10 mg by mouth at bedtime as needed.     clopidogrel (PLAVIX) 75 MG tablet Take 1 tablet (75 mg total) by mouth daily. Ok to stop in December 2023 per Dr. Bjorn Pippin 90 tablet 3   No current facility-administered medications for this visit.   No Known Allergies   Review of Systems: All systems reviewed and negative except where noted in HPI.     Physical Exam:    Wt Readings from Last 3 Encounters:  10/17/22 214 lb (97.1 kg)  02/09/22 214 lb 3.2 oz (97.2 kg)  10/19/21 217 lb (98.4 kg)    BP 126/70   Pulse (!) 54   Ht 6' (1.829 m)   Wt 214 lb (97.1 kg)   BMI 29.02 kg/m  Constitutional:  Pleasant, in no acute distress. Psychiatric: Normal mood and affect. Behavior is normal. Cardiovascular: Normal rate, regular rhythm. No edema Pulmonary/chest: Effort normal and breath sounds normal. No wheezing, rales or rhonchi. Abdominal: Soft, nondistended, nontender. Bowel sounds active throughout. There are no masses palpable. No hepatomegaly. Neurological: Alert and oriented to person place and time. Skin: Skin is warm and dry. No rashes noted.   ASSESSMENT AND PLAN;   1) Fhx of colon cancer 2) Personal Hx of colon polyps 3) Diverticulosis with redundant colon  - Colonoscopy for ongoing surveillance - Plan for ultraslim scope - Ok to continue ASA 81 mg in the perioperative setting  4) CAD with hx of CABG - As above, will continue ASA 81 mg in the perioperative period  The indications, risks, and benefits of colonoscopy were explained to the patient in detail. Risks include but  are not limited to bleeding, perforation, adverse reaction to medications, and cardiopulmonary compromise. Sequelae include but are not limited to the possibility of surgery, hospitalization, and mortality. The patient verbalized understanding and wished to proceed. All questions answered, referred to the scheduler and bowel prep ordered. Further recommendations pending results of the exam.     Shellia Cleverly, DO, FACG  10/17/2022, 11:32 AM   Pernell Dupre Cephas Darby, FNP

## 2022-10-28 ENCOUNTER — Other Ambulatory Visit (HOSPITAL_BASED_OUTPATIENT_CLINIC_OR_DEPARTMENT_OTHER): Payer: Self-pay | Admitting: Family

## 2022-10-28 DIAGNOSIS — I10 Essential (primary) hypertension: Secondary | ICD-10-CM

## 2022-10-29 ENCOUNTER — Other Ambulatory Visit: Payer: Self-pay | Admitting: Cardiology

## 2022-10-29 DIAGNOSIS — I214 Non-ST elevation (NSTEMI) myocardial infarction: Secondary | ICD-10-CM

## 2022-10-29 DIAGNOSIS — I10 Essential (primary) hypertension: Secondary | ICD-10-CM

## 2022-10-30 NOTE — Telephone Encounter (Signed)
Rx(s) sent to pharmacy electronically.  

## 2022-11-06 ENCOUNTER — Ambulatory Visit (AMBULATORY_SURGERY_CENTER): Payer: BC Managed Care – PPO | Admitting: Gastroenterology

## 2022-11-06 ENCOUNTER — Encounter: Payer: Self-pay | Admitting: Gastroenterology

## 2022-11-06 VITALS — BP 112/68 | HR 59 | Temp 98.7°F | Resp 12 | Ht 72.0 in | Wt 214.0 lb

## 2022-11-06 DIAGNOSIS — Z09 Encounter for follow-up examination after completed treatment for conditions other than malignant neoplasm: Secondary | ICD-10-CM | POA: Diagnosis present

## 2022-11-06 DIAGNOSIS — K5730 Diverticulosis of large intestine without perforation or abscess without bleeding: Secondary | ICD-10-CM

## 2022-11-06 DIAGNOSIS — Z860100 Personal history of colon polyps, unspecified: Secondary | ICD-10-CM

## 2022-11-06 DIAGNOSIS — Z800 Family history of malignant neoplasm of digestive organs: Secondary | ICD-10-CM | POA: Diagnosis not present

## 2022-11-06 DIAGNOSIS — Q438 Other specified congenital malformations of intestine: Secondary | ICD-10-CM | POA: Diagnosis not present

## 2022-11-06 DIAGNOSIS — K641 Second degree hemorrhoids: Secondary | ICD-10-CM

## 2022-11-06 MED ORDER — SODIUM CHLORIDE 0.9 % IV SOLN: 500.0000 mL | Freq: Once | INTRAVENOUS | Status: DC

## 2022-11-06 NOTE — Patient Instructions (Addendum)
Resume all of your previous medications as ordered.  Read all of the handouts given to your by your recover room nurse.  YOU HAD AN ENDOSCOPIC PROCEDURE TODAY AT THE [facility]:   Refer to the procedure report that was given to you for any specific questions about what was found during the examination.  If the procedure report does not answer your questions, please call your gastroenterologist to clarify.  If you requested that your care partner not be given the details of your procedure findings, then the procedure report has been included in a sealed envelope for you to review at your convenience later.  YOU SHOULD EXPECT: Some feelings of bloating in the abdomen. Passage of more gas than usual.  Walking can help get rid of the air that was put into your GI tract during the procedure and reduce the bloating. If you had a lower endoscopy (such as a colonoscopy or flexible sigmoidoscopy) you may notice spotting of blood in your stool or on the toilet paper. If you underwent a bowel prep for your procedure, you may not have a normal bowel movement for a few days.  Please Note:  You might notice some irritation and congestion in your nose or some drainage.  This is from the oxygen used during your procedure.  There is no need for concern and it should clear up in a day or so.  SYMPTOMS TO REPORT IMMEDIATELY:  Following lower endoscopy (colonoscopy or flexible sigmoidoscopy):  Excessive amounts of blood in the stool  Significant tenderness or worsening of abdominal pains  Swelling of the abdomen that is new, acute  Fever of 100F or higher   For urgent or emergent issues, a gastroenterologist can be reached at any hour by calling [phone]. Do not use MyChart messaging for urgent concerns.    DIET:  We do recommend a small meal at first, but then you may proceed to your regular diet.  Drink plenty of fluids but you should avoid alcoholic beverages for 24 hours. Try to eat high  fiber diet and drink plenty of water.  ACTIVITY:  You should plan to take it easy for the rest of today and you should NOT DRIVE or use heavy machinery until tomorrow (because of the sedation medicines used during the test).    FOLLOW UP: Our staff will call the number listed on your records the next business day following your procedure.  We will call around 7:15- 8:00 am to check on you and address any questions or concerns that you may have regarding the information given to you following your procedure. If we do not reach you, we will leave a message.     If any biopsies were taken you will be contacted by phone or by letter within the next 1-3 weeks.  Please call us at [phone] if you have not heard about the biopsies in 3 weeks.    SIGNATURES/CONFIDENTIALITY: You and/or your care partner have signed paperwork which will be entered into your electronic medical record.  These signatures attest to the fact that that the information above on your After Visit Summary has been reviewed and is understood.  Full responsibility of the confidentiality of this discharge information lies with you and/or your care-partner.

## 2022-11-06 NOTE — Progress Notes (Signed)
GASTROENTEROLOGY PROCEDURE H&P NOTE   Primary Care Physician: Oneal Grout, FNP    Reason for Procedure:   Colon cancer screening, colon polyp surveillance, family history of colon cancer  Plan:    Colonoscopy  Patient is appropriate for endoscopic procedure(s) in the ambulatory ([facility]) setting.  The nature of the procedure, as well as the risks, benefits, and alternatives were carefully and thoroughly reviewed with the patient. Ample time for discussion and questions allowed. The patient understood, was satisfied, and agreed to proceed.     HPI: Jesus Cunningham is a 72 y.o. male who presents for colonoscopy for Colon cancer screening, colon polyp surveillance, family history of colon cancer.  Patient was most recently seen in the Gastroenterology Clinic on 10/17/2022 by me.  No interval change in medical history since that appointment. Please refer to that note for full details regarding GI history and clinical presentation.   Family history notable for mother with colon cancer at age 1.  Previously followed with GI in [place] for IDA, history of colon polyps, IBS-D (treated with Viberzi).   Endoscopic History: - 11/2016: Colonoscopy: 2 small 2-4 mm distal ascending colon adenomas, extremely tortuous redundant colon, diverticulosis. Repeat in 5 years  - 11/2016: EGD: 6 cm hiatal hernia, mild chronic gastritis  - 01/2017: VCE: Normal   Past Medical History:  Diagnosis Date   Colon polyps    Hyperlipidemia    Hypertension     Past Surgical History:  Procedure Laterality Date   ANKLE FUSION     CORONARY ARTERY BYPASS GRAFT N/A 03/30/2021   Procedure: CORONARY ARTERY BYPASS GRAFTING (CABG) TIMES THREE USING LEFT INTERAL MAMMARY ARTERY, AND ENDOSCOPICALLY HARVESTED RIGHT GREATER SAPHENOUS VEIN.;  Surgeon: Corliss Skains, MD;  Location: [facility];  Service: Open Heart Surgery;  Laterality: N/A;   ENDOVEIN HARVEST OF GREATER SAPHENOUS VEIN Right 03/30/2021   Procedure:  ENDOVEIN HARVEST OF RIGHTGREATER SAPHENOUS VEIN;  Surgeon: Corliss Skains, MD;  Location: [facility] OR;  Service: Open Heart Surgery;  Laterality: Right;   LEFT HEART CATH AND CORONARY ANGIOGRAPHY N/A 03/28/2021   Procedure: LEFT HEART CATH AND CORONARY ANGIOGRAPHY;  Surgeon: Kathleene Hazel, MD;  [facility]: [facility] INV[facility];  Service: Cardiovascular;  Laterality: N/A;   TEE WITHOUT CARDIOVERSION N/A 03/30/2021   Procedure: TRANSESOPHAGEAL ECHOCARDIOGRAM (TEE);  Surgeon: Corliss Skains, MD;  Location: [facility];  Service: Open Heart Surgery;  Laterality: N/A;    Prior to Admission medications   Medication Sig Start Date End Date Taking? Authorizing Provider  acetaminophen (TYLENOL) 500 MG tablet Take 2 tablets (1,000 mg total) by mouth every 6 (six) hours as needed. Fever, mild pain 04/03/21   Barrett, Erin R, PA-C  amLODipine (NORVASC) 5 MG tablet TAKE 1 TABLET (5 MG TOTAL) BY MOUTH DAILY. 10/30/22   Little Ishikawa, MD  aspirin EC 81 MG tablet Take 81 mg by mouth daily.    [provider]  cetirizine (ZYRTEC) 10 MG tablet Take 10 mg by mouth daily as needed for allergies.    [provider]  diphenhydrAMINE (BENADRYL) 25 MG tablet Take 25 mg by mouth every 6 (six) hours as needed for allergies.    [provider]  ezetimibe (ZETIA) 10 MG tablet Take 1 tablet (10 mg total) by mouth daily. 07/27/22   Little Ishikawa, MD  fluticasone (FLONASE) 50 MCG/ACT nasal spray Place 1 spray into both nostrils daily as needed for allergies or rhinitis.    [provider]  hydrochlorothiazide (MICROZIDE) 12.5 MG capsule TAKE 1 CAPSULE BY MOUTH EVERY DAY 10/30/22   Alver Sorrow, NP  lisinopril (ZESTRIL) 40 MG tablet TAKE 1 TABLET BY MOUTH ONCE DAILY. 08/07/22   Little Ishikawa, MD  rosuvastatin (CRESTOR) 20 MG tablet TAKE 1 TABLET BY MOUTH EVERY DAY 10/30/22   Little Ishikawa, MD  VIBERZI 100 MG TABS Take 100 mg by mouth daily.  03/07/21   [provider]  zolpidem (AMBIEN) 10 MG tablet Take 10 mg by mouth at bedtime as needed. 08/29/21   [provider]    Current Outpatient Medications  Medication Sig Dispense Refill   acetaminophen (TYLENOL) 500 MG tablet Take 2 tablets (1,000 mg total) by mouth every 6 (six) hours as needed. Fever, mild pain 30 tablet 0   amLODipine (NORVASC) 5 MG tablet TAKE 1 TABLET (5 MG TOTAL) BY MOUTH DAILY. 90 tablet 0   aspirin EC 81 MG tablet Take 81 mg by mouth daily.     cetirizine (ZYRTEC) 10 MG tablet Take 10 mg by mouth daily as needed for allergies.     diphenhydrAMINE (BENADRYL) 25 MG tablet Take 25 mg by mouth every 6 (six) hours as needed for allergies.     ezetimibe (ZETIA) 10 MG tablet Take 1 tablet (10 mg total) by mouth daily. 90 tablet 1   fluticasone (FLONASE) 50 MCG/ACT nasal spray Place 1 spray into both nostrils daily as needed for allergies or rhinitis.     hydrochlorothiazide (MICROZIDE) 12.5 MG capsule TAKE 1 CAPSULE BY MOUTH EVERY DAY 90 capsule 0   lisinopril (ZESTRIL) 40 MG tablet TAKE 1 TABLET BY MOUTH ONCE DAILY. 90 tablet 1   rosuvastatin (CRESTOR) 20 MG tablet TAKE 1 TABLET BY MOUTH EVERY DAY 90 tablet 0   VIBERZI 100 MG TABS Take 100 mg by mouth daily.     zolpidem (AMBIEN) 10 MG tablet Take 10 mg by mouth at bedtime as needed.     No current facility-administered medications for this visit.    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  Tobacco Use   Smoking status:  Never   Smokeless tobacco: Former    Types: Chew    Quit date: 10/11/1981  Vaping Use   Vaping status: Never Used  Substance and Sexual Activity   Alcohol use: Yes    Alcohol/week: 4.0 standard drinks of alcohol    Types: 4 Glasses of wine per week    Comment: a glass a wine 2 a week   Drug use: No   Sexual activity: Not on file  Other Topics Concern   Not on file  Social History Narrative   Not on file   Social Determinants of Health   Financial Resource Strain: Not on file  Food Insecurity: Not on file  Transportation Needs: Not on file  Physical Activity: Not on file  Stress: Not  on file  Social Connections: Not on file  Intimate Partner Violence: Not on file    Physical Exam: Vital signs in last 24 hours: @There  were no vitals taken for this visit. GEN: NAD EYE: Sclerae anicteric ENT: MMM CV: Non-tachycardic Pulm: CTA b/l GI: Soft, NT/ND NEURO:  Alert & Oriented x 3   Doristine Locks, DO [facility]   11/06/2022 10:12 AM

## 2022-11-06 NOTE — Op Note (Signed)
[facility] Patient Name: Jesus Cunningham Procedure Date: 11/06/2022 11:19 AM MRN: [id number] Endoscopist: Doristine Locks , MD, [id number] Age: 72 Referring MD:  Date of Birth: 10/29/1950 Gender: Male Account #: [id number] Procedure:                Colonoscopy Indications:              Screening in patient at increased risk: Colorectal                            cancer in mother 23 or older, High risk colon                            cancer surveillance: Personal history of colonic                            polyps Medicines:                Monitored Anesthesia Care Procedure:                Pre-Anesthesia Assessment:                           - Prior to the procedure, a History and Physical                            was performed, and patient medications and                            allergies were reviewed. The patient's tolerance of                            previous anesthesia was also reviewed. The risks                            and benefits of the procedure and the sedation                            options and risks were discussed with the patient.                            All questions were answered, and informed consent                            was obtained. Prior Anticoagulants: The patient has                            taken no anticoagulant or antiplatelet agents. ASA                            Grade Assessment: II - A patient with mild systemic                            disease. After reviewing the risks and benefits,  the patient was deemed in satisfactory condition to                            undergo the procedure.                           After obtaining informed consent, the colonoscope                            was passed under direct vision. Throughout the                            procedure, the patient's blood pressure, pulse, and                            oxygen saturations were monitored continuously. The                             [id number]-[id number] Ultraslim SN [id number] was introduced                            through the anus and advanced to the the cecum,                            identified by appendiceal orifice and ileocecal                            valve. The colonoscopy was performed without                            difficulty. The patient tolerated the procedure                            well. The quality of the bowel preparation was                            good. The ileocecal valve, appendiceal orifice, and                            rectum were photographed. Scope In: 11:28:45 AM Scope Out: 11:44:50 AM Scope Withdrawal Time: 0 hours 7 minutes 39 seconds  Total Procedure Duration: 0 hours 16 minutes 5 seconds  Findings:                 The perianal and digital rectal examinations were                            normal.                           The sigmoid colon and hepatic flexure were                            moderately tortuous. Advancing the scope required  using manual pressure.                           A few small-mouthed diverticula were found in the                            descending colon, transverse colon and ascending                            colon.                           Non-bleeding internal hemorrhoids were found during                            retroflexion. The hemorrhoids were small. Complications:            No immediate complications. Estimated Blood Loss:     Estimated blood loss was minimal. Impression:               - Tortuous colon with looping.                           - Diverticulosis in the descending colon, in the                            transverse colon and in the ascending colon.                           - Non-bleeding internal hemorrhoids.                           - No specimens collected. Recommendation:           - Patient has a contact number available for                            emergencies. The  signs and symptoms of potential                            delayed complications were discussed with the                            patient. Return to normal activities tomorrow.                            Written discharge instructions were provided to the                            patient.                           - Resume previous diet.                           - Continue present medications.                           -  Repeat colonoscopy in 5 years for screening                            purposes due to family history of colon cancer and                            personal history of polyps.                           - Return to GI clinic PRN. Doristine Locks, MD 11/06/2022 11:51:20 AM

## 2022-11-06 NOTE — Progress Notes (Signed)
Pt awake, alert and oriented. VSS. Airway intact. SBAR complete to RN. All questions answered.  

## 2022-11-06 NOTE — Progress Notes (Signed)
Pt's states no medical or surgical changes since previsit or office visit. 

## 2022-11-07 ENCOUNTER — Telehealth: Payer: Self-pay

## 2022-11-07 NOTE — Telephone Encounter (Signed)
No answer on follow up call. 

## 2022-11-15 ENCOUNTER — Encounter: Payer: Self-pay | Admitting: Cardiology

## 2023-01-24 ENCOUNTER — Other Ambulatory Visit (HOSPITAL_BASED_OUTPATIENT_CLINIC_OR_DEPARTMENT_OTHER): Payer: Self-pay | Admitting: Family

## 2023-01-24 ENCOUNTER — Other Ambulatory Visit: Payer: Self-pay | Admitting: Cardiology

## 2023-01-24 DIAGNOSIS — I10 Essential (primary) hypertension: Secondary | ICD-10-CM

## 2023-01-25 ENCOUNTER — Other Ambulatory Visit: Payer: Self-pay

## 2023-01-25 ENCOUNTER — Other Ambulatory Visit: Payer: Self-pay | Admitting: Cardiology

## 2023-01-25 DIAGNOSIS — I214 Non-ST elevation (NSTEMI) myocardial infarction: Secondary | ICD-10-CM

## 2023-01-25 DIAGNOSIS — I10 Essential (primary) hypertension: Secondary | ICD-10-CM

## 2023-01-25 MED ORDER — LISINOPRIL 40 MG PO TABS: 40.0000 mg | ORAL_TABLET | Freq: Every day | ORAL | 0 refills | Status: DC

## 2023-01-25 MED ORDER — EZETIMIBE 10 MG PO TABS: 10.0000 mg | ORAL_TABLET | Freq: Every day | ORAL | 0 refills | Status: DC

## 2023-02-11 NOTE — Progress Notes (Unsigned)
Cardiology Office Note:    Date:  02/12/2023   ID:  Jesus Cunningham, DOB June 18, 1950, MRN [id number]  PCP:  Oneal Grout, FNP  Cardiologist:  Little Ishikawa, MD  Electrophysiologist:  None   Referring MD: Oneal Grout, FNP   No chief complaint on file.   History of Present Illness:    Jesus Cunningham is a 72 y.o. male with a hx of CAD status post CABG x3 (LIMA-LAD, SVG-PDA, SVG-OM2), hypertension, hyperlipidemia who presents for follow-up.  He he was admitted 03/2021 with NSTEMI, found to have severe multivessel CAD.  Echocardiogram 03/28/2021 showed normal biventricular function, grade 1 diastolic dysfunction, no significant valvular disease.  Zio patch x14 days on 08/12/2021 showed 1 episode of NSVT lasting 4 beats.  Since last clinic visit, he reports he is doing well.  Denies any chest pain, dyspnea, lightheadedness, syncope, lower extremity edema, or palpitations.  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   BP Readings from Last 3 Encounters:  02/12/23 (!) 138/96  11/06/22 112/68  10/17/22 126/70     Past Medical History:  Diagnosis Date   Colon polyps    Hyperlipidemia    Hypertension     Past Surgical History:  Procedure Laterality Date   ANKLE FUSION     CORONARY ARTERY BYPASS GRAFT N/A 03/30/2021   Procedure: CORONARY ARTERY BYPASS GRAFTING (CABG) TIMES THREE USING LEFT INTERAL MAMMARY ARTERY, AND ENDOSCOPICALLY HARVESTED RIGHT GREATER SAPHENOUS VEIN.;  Surgeon: Corliss Skains, MD;  Location: [facility] OR;  Service: Open Heart Surgery;  Laterality: N/A;   ENDOVEIN HARVEST OF GREATER SAPHENOUS VEIN Right 03/30/2021   Procedure: ENDOVEIN HARVEST OF RIGHTGREATER SAPHENOUS VEIN;  Surgeon: Corliss Skains, MD;  Location: [facility] OR;  Service: Open Heart Surgery;  Laterality: Right;   LEFT HEART CATH AND CORONARY ANGIOGRAPHY N/A 03/28/2021   Procedure: LEFT HEART CATH AND CORONARY ANGIOGRAPHY;  Surgeon: Kathleene Hazel, MD;  Location: [facility] INVASIVE CV LAB;  Service: Cardiovascular;  Laterality: N/A;   TEE WITHOUT CARDIOVERSION N/A 03/30/2021   Procedure: TRANSESOPHAGEAL ECHOCARDIOGRAM (TEE);  Surgeon: Corliss Skains, MD;  Location: [facility] OR;  Service: Open Heart Surgery;  Laterality: N/A;    Current Medications: Current Meds  Medication Sig   acetaminophen (TYLENOL) 500 MG tablet Take 2 tablets (1,000 mg total) by mouth every 6 (six) hours as needed. Fever, mild pain   amLODipine (NORVASC) 10 MG tablet Take 1 tablet (10 mg total) by mouth daily.   aspirin EC 81 MG tablet Take 81 mg by mouth daily.   cetirizine (ZYRTEC) 10 MG tablet Take 10 mg by mouth daily as needed for allergies.   diphenhydrAMINE (BENADRYL) 25 MG tablet Take 25 mg by mouth every 6 (six) hours as needed for allergies.   ezetimibe (ZETIA) 10 MG tablet Take 1 tablet (10 mg total) by mouth daily. Please keep scheduled appointment for future refills. Thank you.   fluticasone (FLONASE) 50 MCG/ACT nasal spray Place 1 spray into both nostrils daily as needed for allergies or rhinitis.   hydrochlorothiazide (MICROZIDE) 12.5 MG capsule TAKE 1 CAPSULE BY MOUTH EVERY DAY   lisinopril (ZESTRIL) 40 MG tablet Take 1 tablet (40 mg total) by mouth daily. Please keep scheduled appointment for future refills. Thank  you.   meloxicam (MOBIC) 15 MG tablet TAKE (1) TABLET BY MOUTH DAILY WITH FOOD   rosuvastatin (CRESTOR) 20 MG tablet TAKE 1 TABLET BY MOUTH EVERY DAY  VIBERZI 100 MG TABS Take 100 mg by mouth daily.   zolpidem (AMBIEN) 10 MG tablet Take 10 mg by mouth at bedtime as needed.   [DISCONTINUED] amLODipine (NORVASC) 5 MG tablet Take 1 tablet (5 mg total) by mouth daily. Please keep scheduled appointment for future refills. Thank you.   Current Facility-Administered Medications for the 02/12/23 encounter (Office Visit) with Little Ishikawa, MD  Medication   0.9 %  sodium chloride infusion     Allergies:   Patient has no known  allergies.   Social History   Socioeconomic History   Marital status: Married    Spouse name: Not on file   Number of children: 2   Years of education: Not on file   Highest education level: Not on file  Occupational History   Occupation: tax Tourist information centre manager  Tobacco Use   Smoking status: Never   Smokeless tobacco: Former    Types: Chew    Quit date: 10/11/1981  Vaping Use   Vaping status: Never Used  Substance and Sexual Activity   Alcohol use: Yes    Alcohol/week: 4.0 standard drinks of alcohol    Types: 4 Glasses of wine per week    Comment: a glass a wine 2 a week   Drug use: No   Sexual activity: Not on file  Other Topics Concern   Not on file  Social History Narrative   Not on file   Social Determinants of Health   Financial Resource Strain: Not on file  Food Insecurity: Not on file  Transportation Needs: Not on file  Physical Activity: Not on file  Stress: Not on file  Social Connections: Not on file     Family History: The patient's family history includes Colon cancer in his mother; Diabetes in his father; Heart failure in his father; Hypertension in his brother; Stroke in his mother. There is no history of Stomach cancer or Esophageal cancer.  ROS:   Please see the history of present illness.     All other systems reviewed and are negative.  EKGs/Labs/Other Studies Reviewed:    The following studies were reviewed today:   EKG:   02/09/22: Sinus bradycardia, rate 53, no ST abnormalities 02/12/23: Sinus bradycardia, rate 58, no ST abnormalities  Recent Labs: 10/05/2022: Hemoglobin 15.1; Platelets 287  Recent Lipid Panel    Component Value Date/Time   CHOL 123 02/09/2022 1158   TRIG 79 02/09/2022 1158   HDL 55 02/09/2022 1158   CHOLHDL 2.2 02/09/2022 1158   CHOLHDL 2.1 03/28/2021 0230   VLDL 12 03/28/2021 0230   LDLCALC 52 02/09/2022 1158   LDLDIRECT 84 04/26/2021 1458    Physical Exam:    VS:  BP (!) 138/96 (BP Location: Right Arm, Patient  Position: Sitting, Cuff Size: Normal)   Pulse (!) 50   Ht 6' (1.829 m)   Wt 209 lb 6.4 oz (95 kg)   SpO2 97%   BMI 28.40 kg/m     Wt Readings from Last 3 Encounters:  02/12/23 209 lb 6.4 oz (95 kg)  11/06/22 214 lb (97.1 kg)  10/17/22 214 lb (97.1 kg)     GEN:  Well nourished, well developed in no acute distress HEENT: Normal NECK: No JVD; No carotid bruits LYMPHATICS: No lymphadenopathy CARDIAC: RRR, no murmurs, rubs, gallops RESPIRATORY:  Clear to auscultation without rales, wheezing or rhonchi  ABDOMEN: Soft, non-tender, non-distended MUSCULOSKELETAL:  No edema; No deformity  SKIN: Warm and dry NEUROLOGIC:  Alert and oriented x 3  PSYCHIATRIC:  Normal affect   ASSESSMENT:    1. Pre-op evaluation   2. Coronary artery disease involving native coronary artery of native heart without angina pectoris   3. Essential hypertension   4. Hyperlipidemia, unspecified hyperlipidemia type      PLAN:    Preop evaluation: scheduled for knee surgery on 04/04/23 at [facility].  Good functional capacity, denies any exertional symptoms.  RCRI score 1 given CAD history.  Overall intermediate risk for surgery, no further cardiac workup recommended prior to procedure.  CAD: status post CABG x3 (LIMA-LAD, SVG-PDA, SVG-OM2) on 03/30/2021.  He he was admitted 03/2021 with NSTEMI, found to have severe multivessel CAD.  Echocardiogram 03/28/2021 showed normal biventricular function, grade 1 diastolic dysfunction, no significant valvular disease. -Continue aspirin 81 mg daily.   -Continue rosuvastatin 20 mg daily -Metoprolol was discontinued due to bradycardia  Syncope: Occurred in January 2023, suspect due to dehydration.  Zio patch x14 days on 08/12/2021 showed 1 episode of NSVT lasting 4 beats.  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  No evidence of recurrence  Hypertension: On lisinopril 40 mg  daily, amlodipine 5 mg daily, HCTZ 12.5 mg daily.    Hyperlipidemia: On rosuvastatin 20 mg daily.  Add Zetia 10 mg daily.  LDL 52 on 02/09/2022  RTC in 6 months  Medication Adjustments/Labs and Tests Ordered: Current medicines are reviewed at length with the patient today.  Concerns regarding medicines are outlined above.  Orders Placed This Encounter  Procedures   Basic metabolic panel   Magnesium   Lipid panel   EKG 12-Lead   Meds ordered this encounter  Medications   amLODipine (NORVASC) 10 MG tablet    Sig: Take 1 tablet (10 mg total) by mouth daily.    Dispense:  90 tablet    Refill:  3    Patient Instructions  Medication Instructions:  Increase Amlodipine to 10 mg daily Continue all other medications *If you need a refill on your cardiac medications before your next appointment, please call your pharmacy*   Lab Work: Bmet,magnesium,lipid panel today   Testing/Procedures: None ordered   Follow-Up: At [facility], you and your health needs are our priority.  As part of our continuing mission to provide you with exceptional heart care, we have created designated Provider Care Teams.  These Care Teams include your primary Cardiologist (physician) and Advanced Practice Providers (APPs -  Physician Assistants and Nurse Practitioners) who all work together to provide you with the care you need, when you need it.  We recommend signing up for the patient portal called "MyChart".  Sign up information is provided on this After Visit Summary.  MyChart is used to connect with patients for Virtual Visits (Telemedicine).  Patients are able to view lab/test results, encounter notes, upcoming appointments, etc.  Non-urgent messages can be sent to your provider as well.   To learn more about what you can do with MyChart, go to [email or web address].    Your next appointment:  6 months    Call in Jan to schedule April appointment     Provider:  Dr.Alice Vitelli    Check  blood pressure daily and send readings through mychart in 2 weeks     Signed, Little Ishikawa, MD  02/12/2023 11:11 PM    [facility]

## 2023-02-12 ENCOUNTER — Encounter: Payer: Self-pay | Admitting: Cardiology

## 2023-02-12 ENCOUNTER — Ambulatory Visit: Payer: BC Managed Care – PPO | Attending: Cardiology | Admitting: Cardiology

## 2023-02-12 VITALS — BP 138/96 | HR 50 | Ht 72.0 in | Wt 209.4 lb

## 2023-02-12 DIAGNOSIS — E785 Hyperlipidemia, unspecified: Secondary | ICD-10-CM | POA: Diagnosis not present

## 2023-02-12 DIAGNOSIS — I2510 Atherosclerotic heart disease of native coronary artery without angina pectoris: Secondary | ICD-10-CM | POA: Diagnosis not present

## 2023-02-12 DIAGNOSIS — Z01818 Encounter for other preprocedural examination: Secondary | ICD-10-CM | POA: Diagnosis not present

## 2023-02-12 DIAGNOSIS — I10 Essential (primary) hypertension: Secondary | ICD-10-CM

## 2023-02-12 MED ORDER — AMLODIPINE BESYLATE 10 MG PO TABS: 10.0000 mg | ORAL_TABLET | Freq: Every day | ORAL | 3 refills | Status: DC

## 2023-02-12 NOTE — Patient Instructions (Signed)
Medication Instructions:  Increase Amlodipine to 10 mg daily Continue all other medications *If you need a refill on your cardiac medications before your next appointment, please call your pharmacy*   Lab Work: Bmet,magnesium,lipid panel today   Testing/Procedures: None ordered   Follow-Up: At [facility], you and your health needs are our priority.  As part of our continuing mission to provide you with exceptional heart care, we have created designated Provider Care Teams.  These Care Teams include your primary Cardiologist (physician) and Advanced Practice Providers (APPs -  Physician Assistants and Nurse Practitioners) who all work together to provide you with the care you need, when you need it.  We recommend signing up for the patient portal called "MyChart".  Sign up information is provided on this After Visit Summary.  MyChart is used to connect with patients for Virtual Visits (Telemedicine).  Patients are able to view lab/test results, encounter notes, upcoming appointments, etc.  Non-urgent messages can be sent to your provider as well.   To learn more about what you can do with MyChart, go to [email or web address].    Your next appointment:  6 months    Call in Jan to schedule April appointment     Provider:  Dr.Schumann    Check blood pressure daily and send readings through mychart in 2 weeks

## 2023-02-13 LAB — LIPID PANEL
Chol/HDL Ratio: 1.7 ratio (ref 0.0–5.0)
Cholesterol, Total: 129 mg/dL (ref 100–199)
HDL: 74 mg/dL (ref >39)
LDL Chol Calc (NIH): 43 mg/dL (ref 0–99)
Triglycerides: 52 mg/dL (ref 0–149)
VLDL Cholesterol Cal: 12 mg/dL (ref 5–40)

## 2023-02-13 LAB — BASIC METABOLIC PANEL
BUN/Creatinine Ratio: 16 (ref 10–24)
BUN: 13 mg/dL (ref 8–27)
CO2: 23 mmol/L (ref 20–29)
Calcium: 9.3 mg/dL (ref 8.6–10.2)
Chloride: 99 mmol/L (ref 96–106)
Creatinine, Ser: 0.83 mg/dL (ref 0.76–1.27)
Glucose: 95 mg/dL (ref 70–99)
Potassium: 4.4 mmol/L (ref 3.5–5.2)
Sodium: 135 mmol/L (ref 134–144)
eGFR: 93 mL/min/{1.73_m2} (ref >59)

## 2023-02-13 LAB — MAGNESIUM: Magnesium: 2 mg/dL (ref 1.6–2.3)

## 2023-02-14 ENCOUNTER — Telehealth: Payer: Self-pay

## 2023-02-14 NOTE — Telephone Encounter (Signed)
Dr.Schumann cleared patient to have upcoming surgery.Records,last office note,ekg,lab,echo report faxed to [facility] at fax # [phone].

## 2023-02-21 ENCOUNTER — Other Ambulatory Visit: Payer: Self-pay | Admitting: Cardiology

## 2023-02-21 DIAGNOSIS — I10 Essential (primary) hypertension: Secondary | ICD-10-CM

## 2023-04-16 IMAGING — DX DG CHEST 1V PORT
1 series · 1 of 1 positions shown · non-contrast
Comparison: 03/30/2021

CLINICAL DATA: CABG, ET tube

EXAM:
PORTABLE CHEST 1 VIEW

[chest]
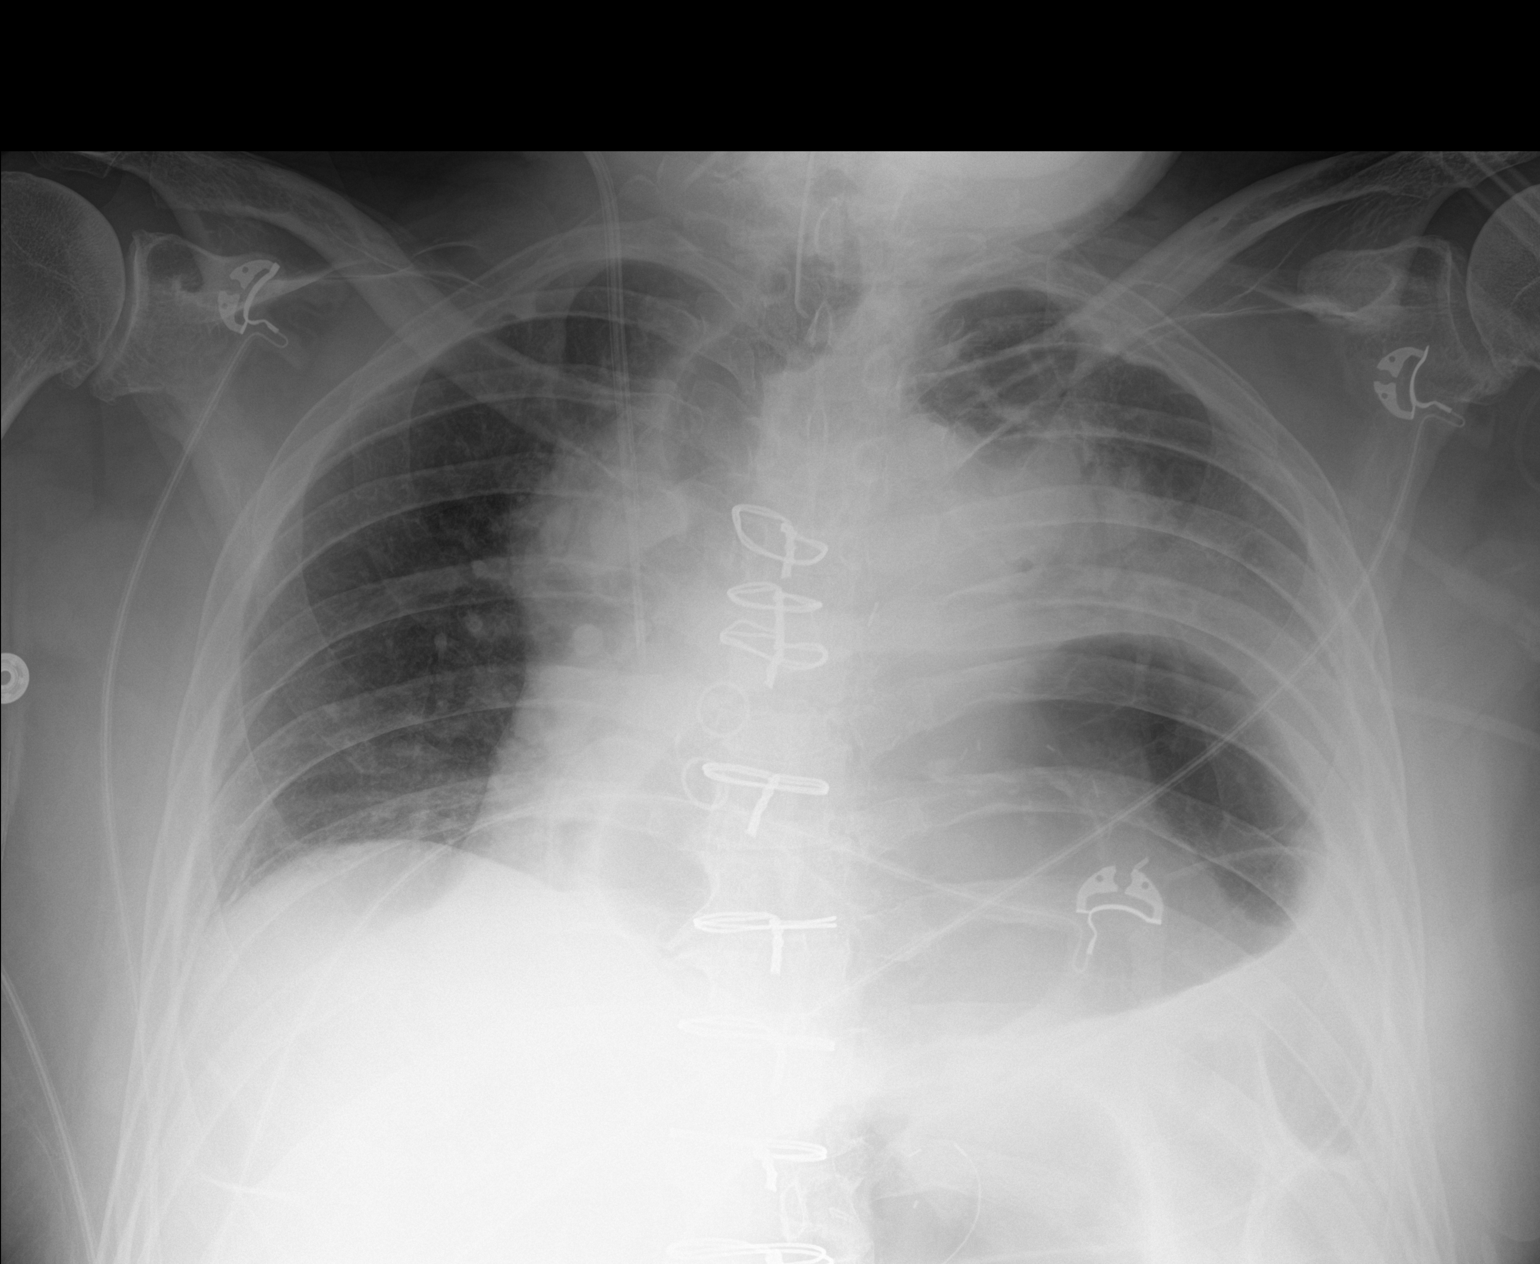

[1 of 1 positions shown; findings below may reference images not displayed]

FINDINGS: Endotracheal tube 7 cm above the carina. Right central line tip in
the SVC. Changes of CABG. Gaseous distention of the stomach which
projects over the left lower chest, question hiatal hernia versus
elevated left hemidiaphragm. Worsening aeration in the left lung
with increasing airspace disease throughout the left lung. Minimal
right base atelectasis.
IMPRESSION: Gaseous distention of the stomach which projects over the left lower
to mid chest causing volume loss in the left chest and increasing
airspace disease in the left lung. This is difficult to determine if
this is a hiatal hernia or elevation of the left hemidiaphragm.

Endotracheal tube 7 cm above the carina.

## 2023-04-16 IMAGING — DX DG CHEST 1V PORT
1 series · 1 of 1 positions shown · non-contrast
Comparison: Previous studies including the examination done earlier
today

CLINICAL DATA: Difficulty breathing, CHF

EXAM:
PORTABLE CHEST 1 VIEW

[chest]
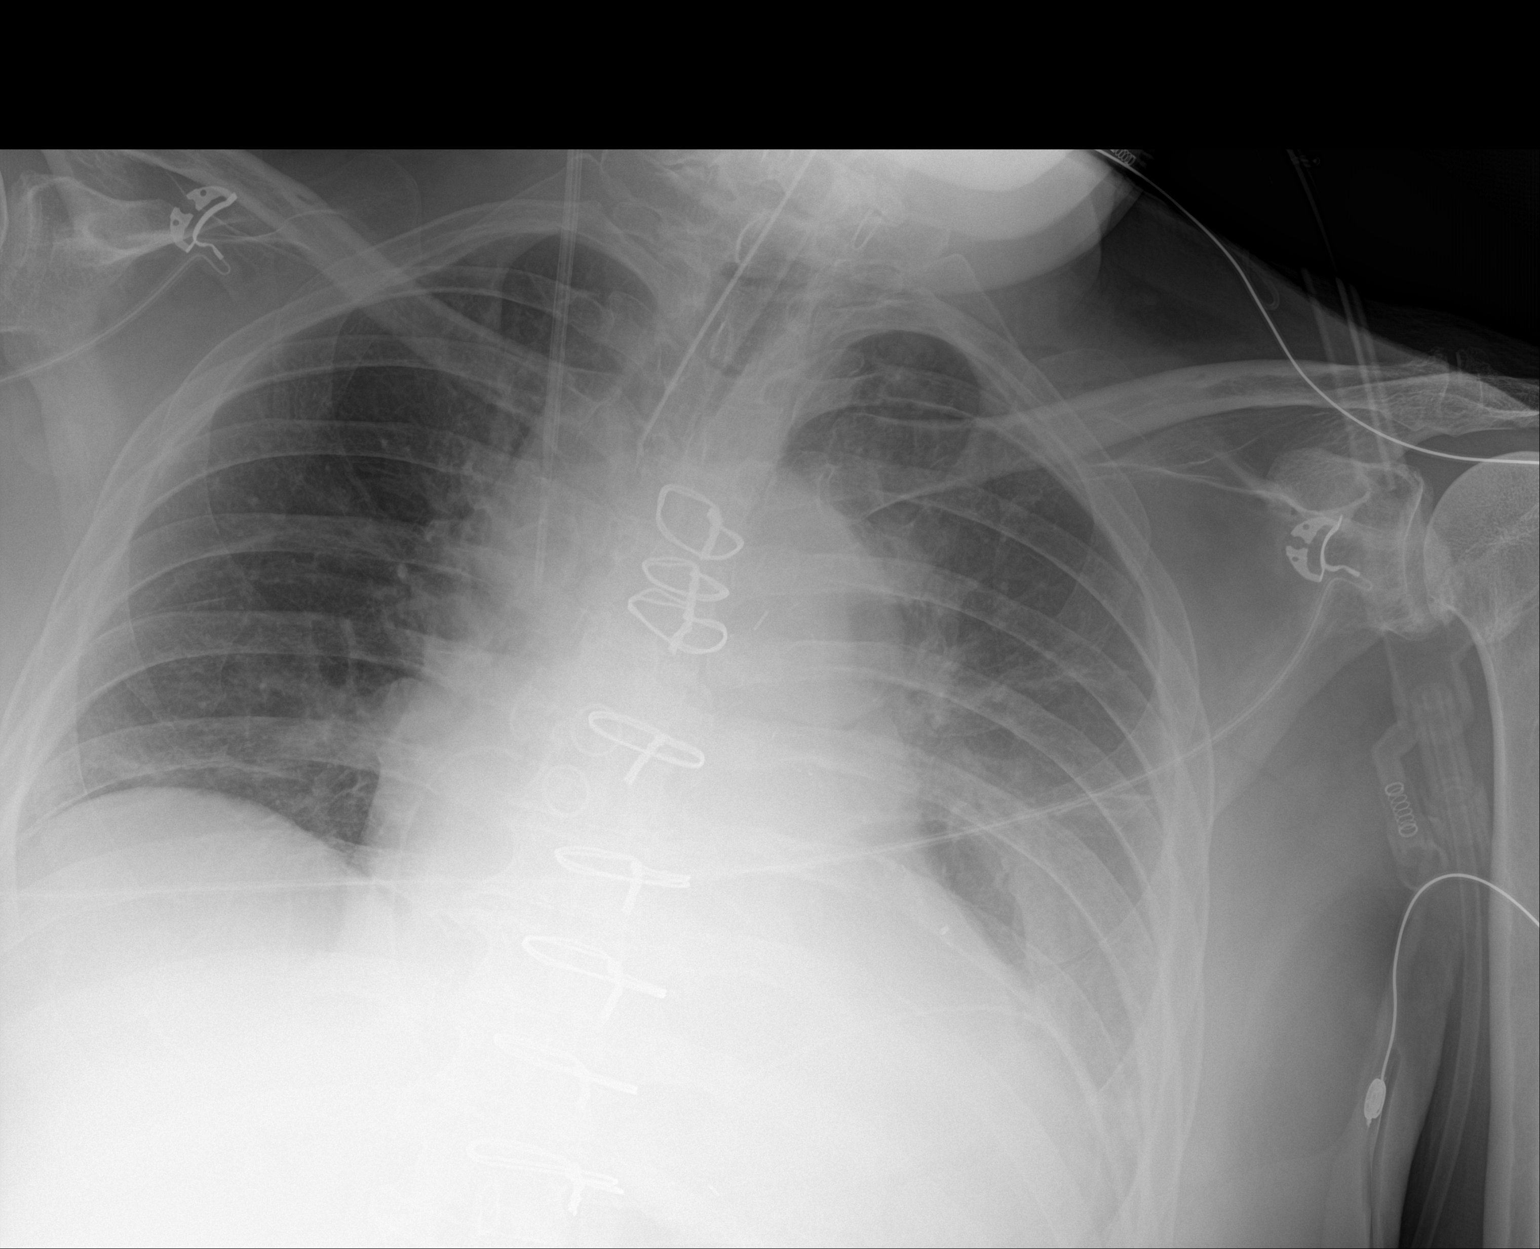

[1 of 1 positions shown; findings below may reference images not displayed]

FINDINGS: Transverse diameter of heart is increased. Tip of endotracheal tube
is proximally 3.9 cm above the carina. Tip of right jugular central
venous catheter is seen in the superior vena cava. Central pulmonary
vessels are less prominent. There residual linear densities in the
left parahilar region and left lower lung fields. There is
radiolucency in the retrocardiac region, possibly suggesting fixed
hiatal hernia.
IMPRESSION: Cardiomegaly. There is interval decrease in pulmonary vascular
congestion. Increased density in the left lower lung fields may be
due to pleural effusion and possibly underlying infiltrate. Possible
large fixed hiatal hernia.

## 2023-04-18 IMAGING — DX DG CHEST 1V PORT
1 series · 1 of 1 positions shown · non-contrast
Comparison: Portable exam 0260 hours compared to 03/31/2021

CLINICAL DATA: Chest tube, pneumothorax, sore chest, post open
heart surgery

EXAM:
PORTABLE CHEST 1 VIEW

[chest]
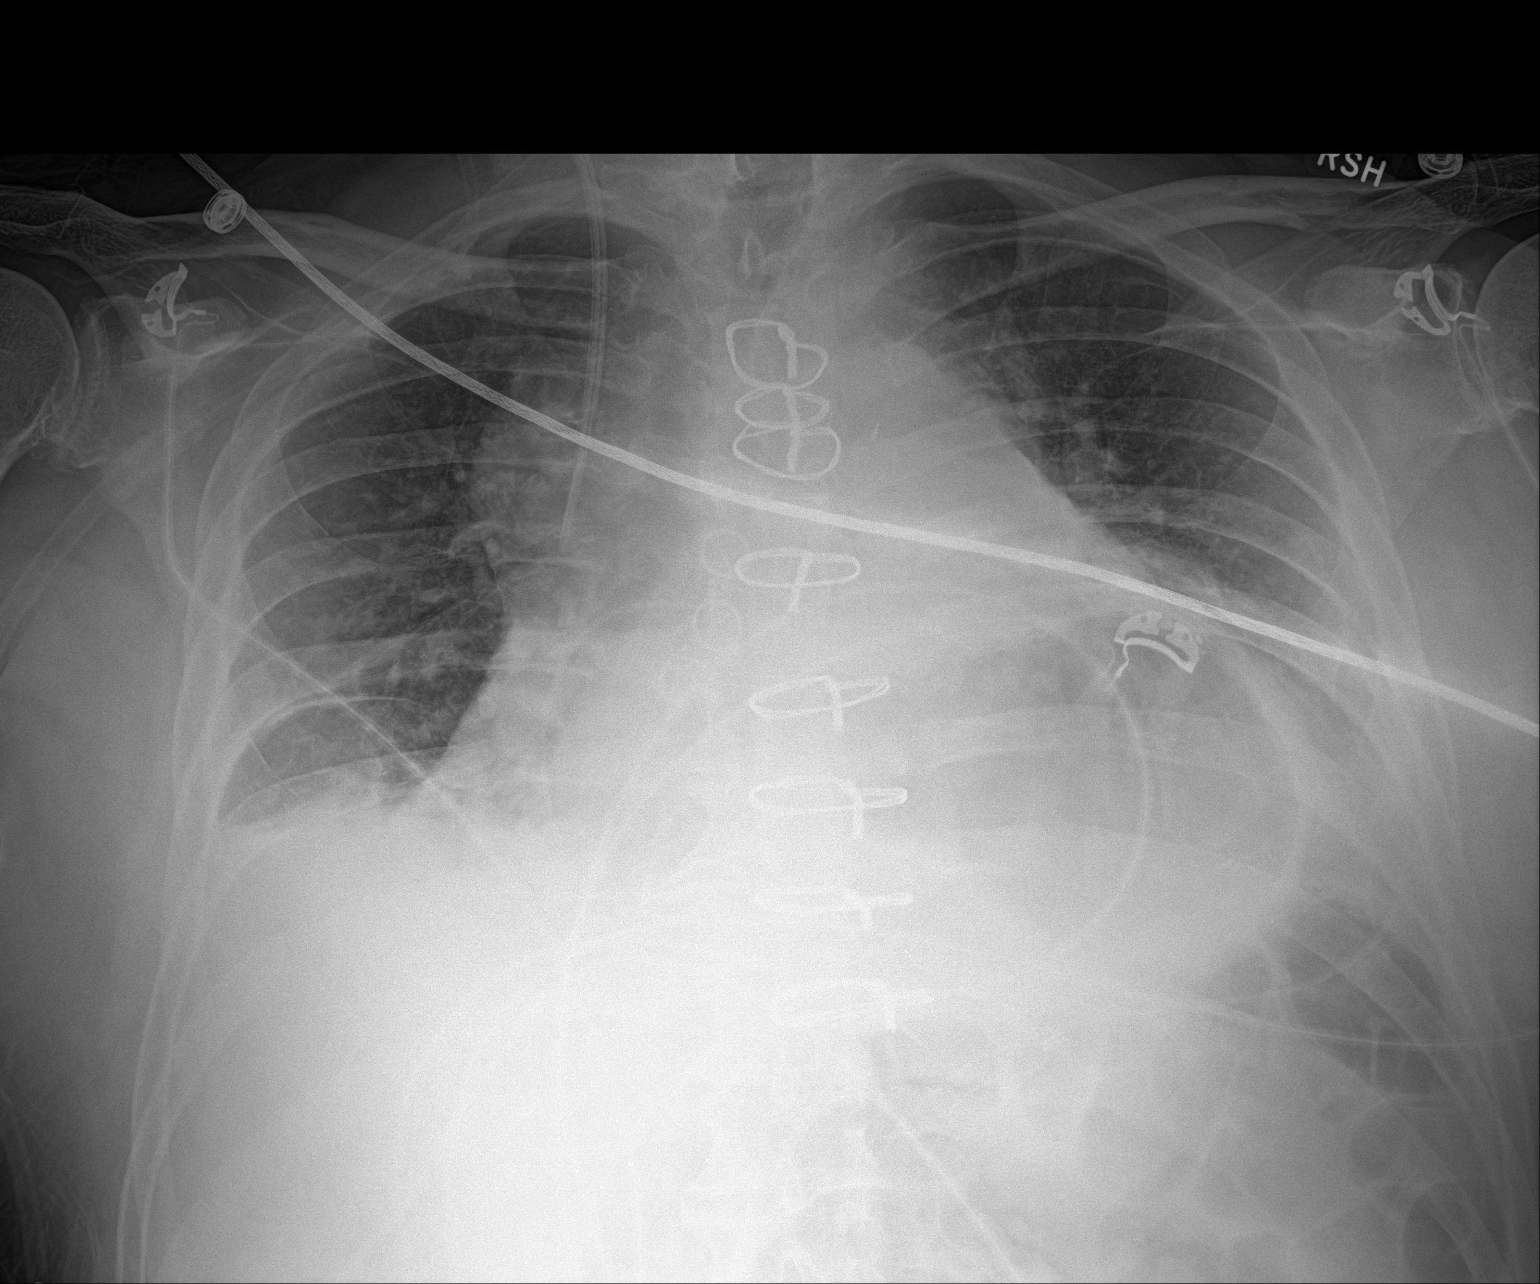

[1 of 1 positions shown; findings below may reference images not displayed]

FINDINGS: RIGHT jugular central venous catheter with tip projecting over SVC.

EKG leads project over chest.

No definite chest tube identified.

Enlargement of cardiac silhouette with normal pulmonary vascularity.

Large hiatal hernia.

Low lung volumes with bibasilar atelectasis.

No gross pleural effusion or pneumothorax.
IMPRESSION: Low lung volumes with bibasilar atelectasis.

Enlargement of cardiac silhouette post CABG.

Large hiatal hernia.

## 2023-04-20 IMAGING — CR DG CHEST 2V
2 series · 2 of 2 positions shown · non-contrast
Comparison: 04/01/2021 and older studies.

CLINICAL DATA: Chest tube removal. CABG surgery on 03/30/2021.
Follow-up exam.

EXAM:
CHEST - 2 VIEW

[chest pa]
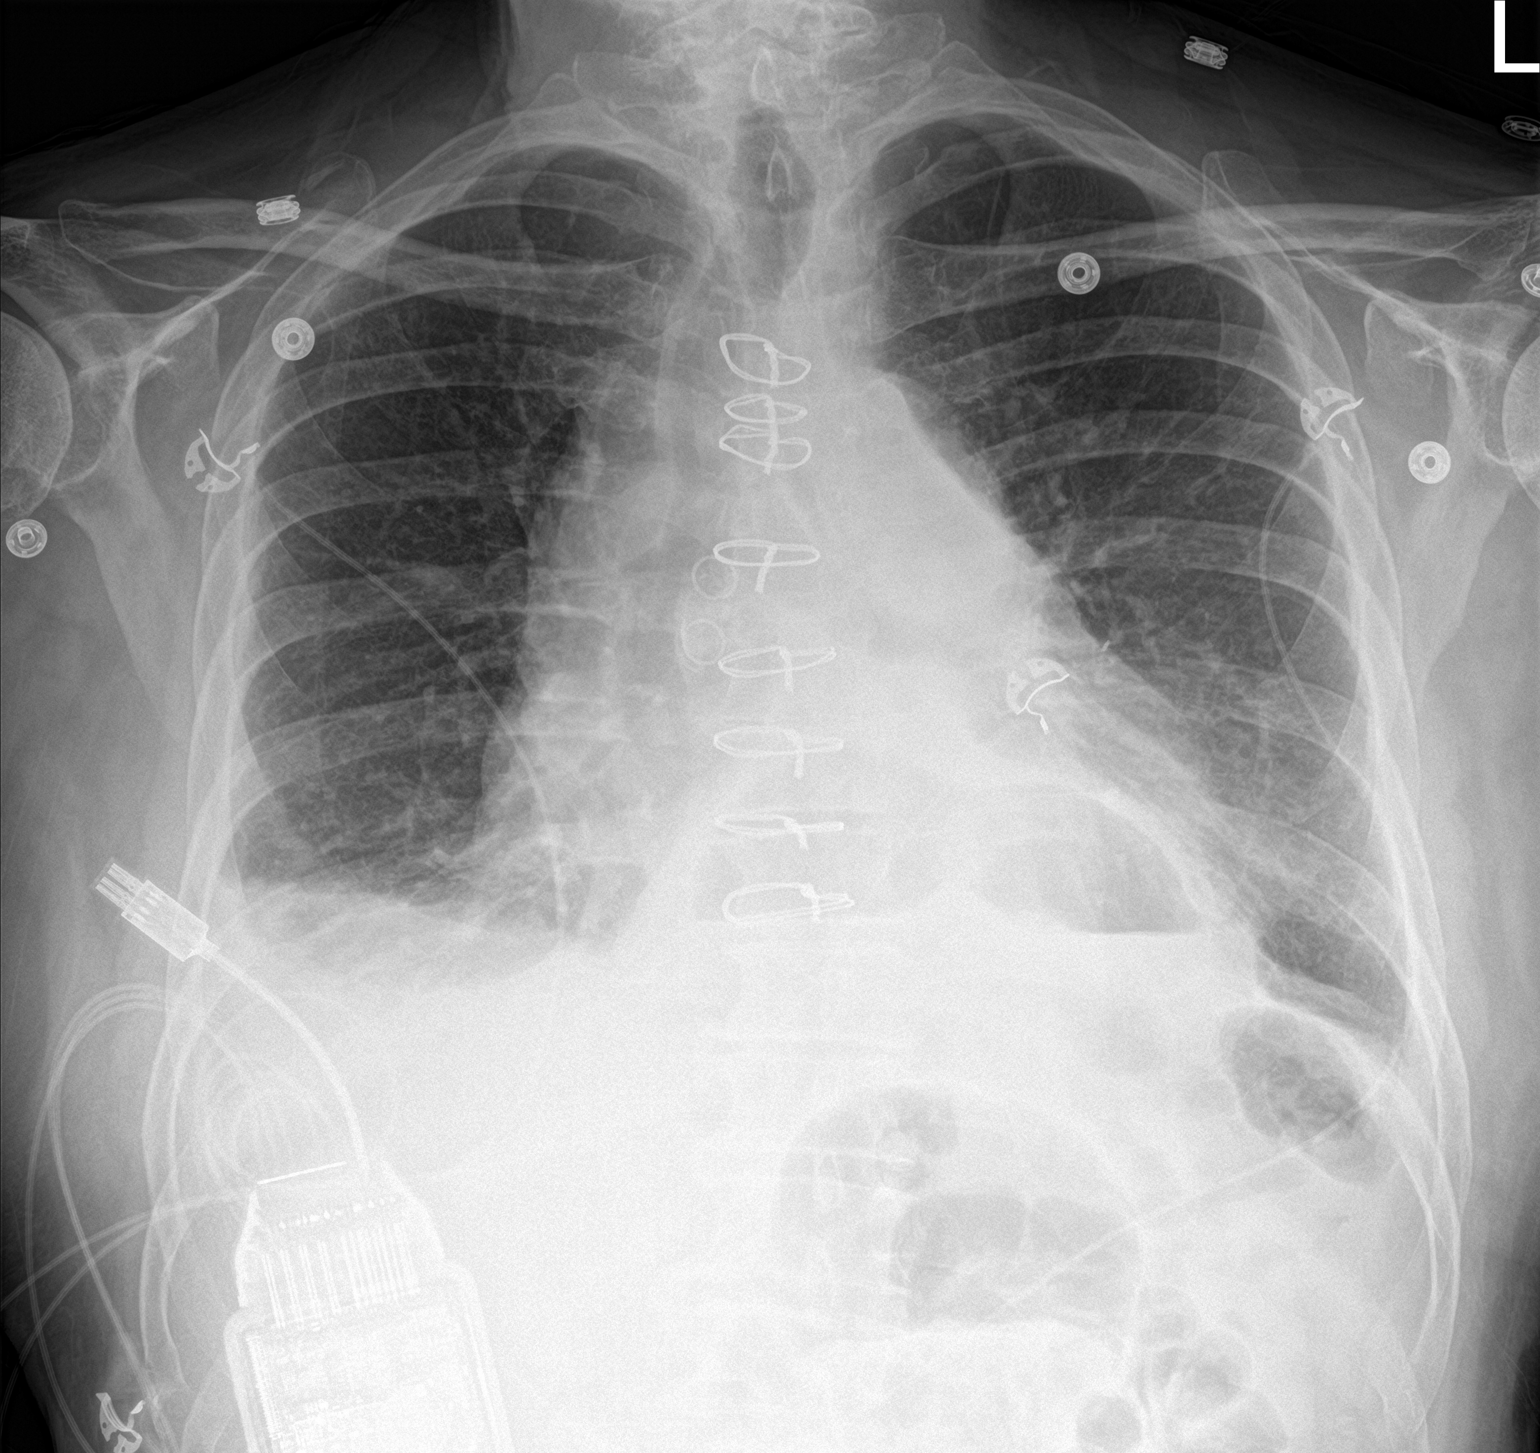

[chest lat]
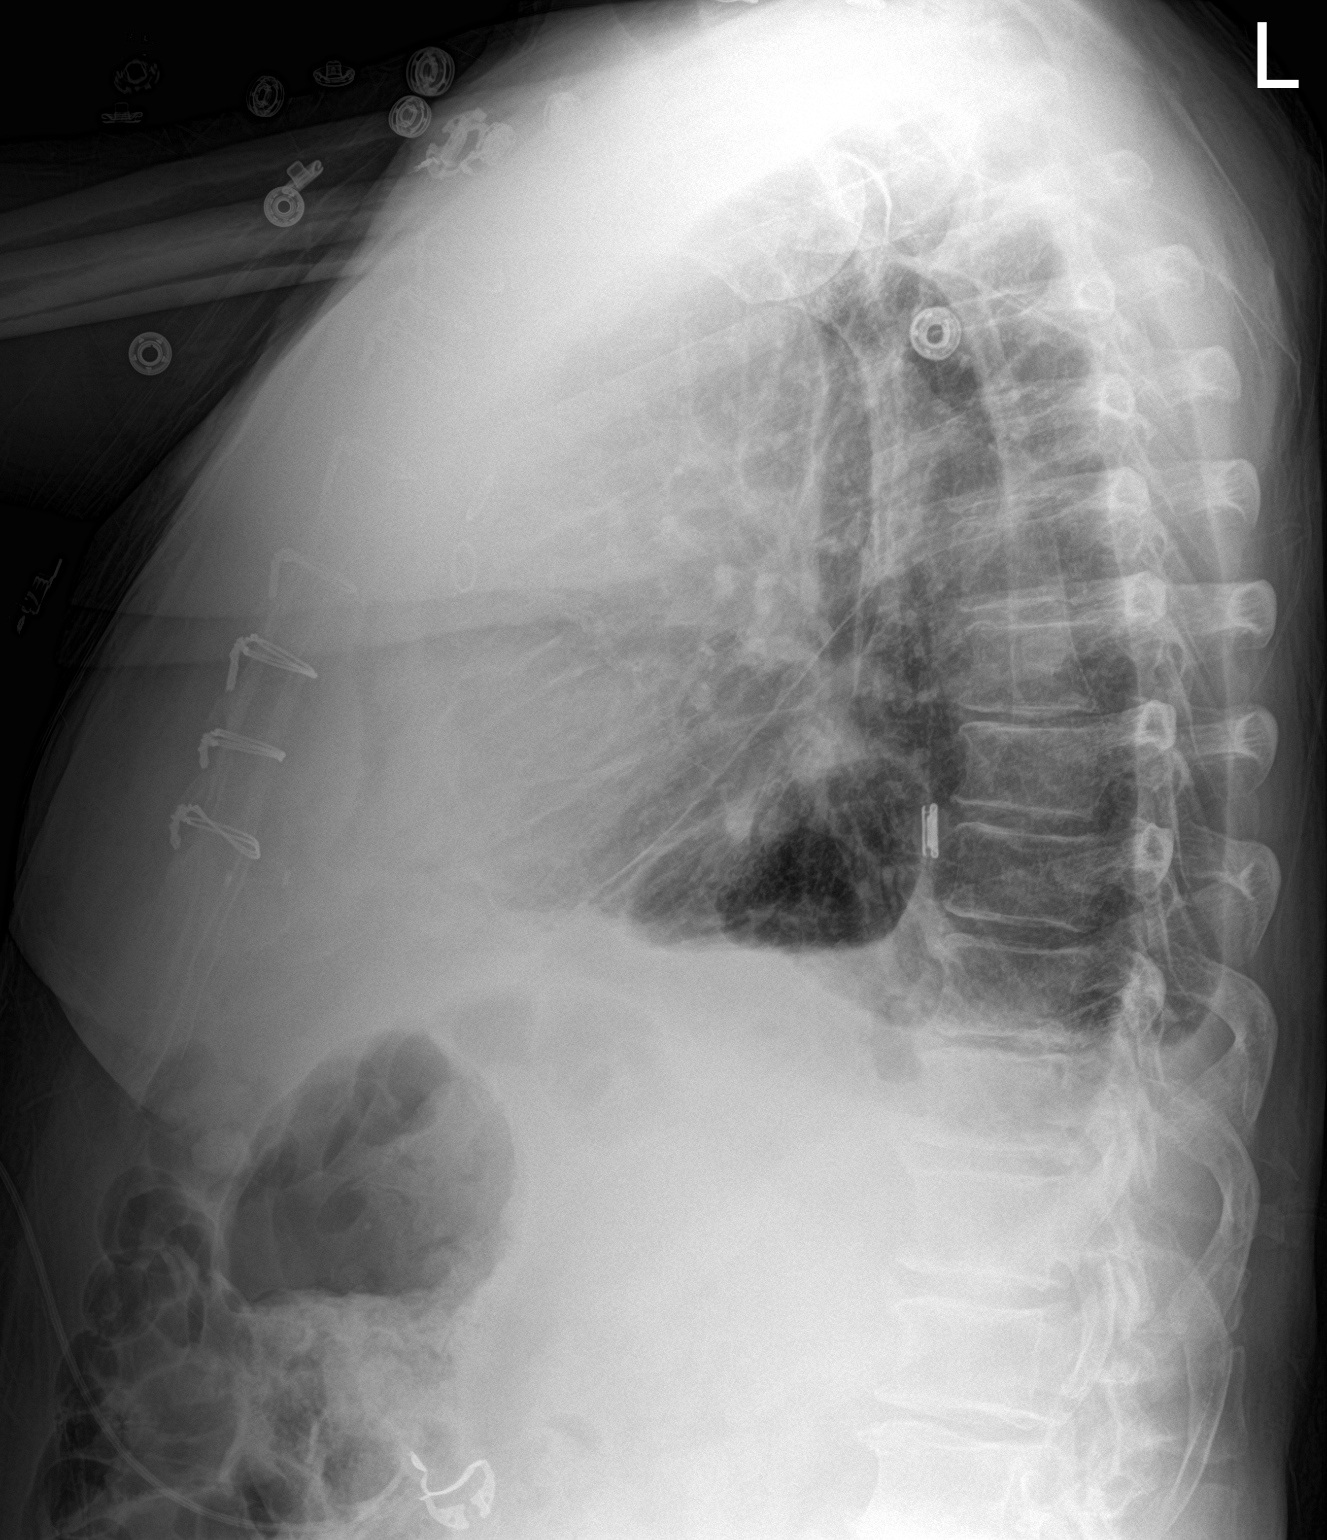

[2 of 2 positions shown; findings below may reference images not displayed]

FINDINGS: Since the prior study, the right internal jugular central venous
line has been removed.

Persistent lung base opacities obscure the hemidiaphragms consistent
with a combination of pleural effusions and atelectasis. Remainder
of the lungs is clear.

No pneumothorax.

Large hiatal hernia.
IMPRESSION: 1. No acute findings or evidence of an operative complication.
2. All lines and tubes have been removed.
3. Lung base opacities consistent atelectasis and pleural fluid
similar to the most recent prior study.

## 2023-04-22 ENCOUNTER — Other Ambulatory Visit (HOSPITAL_BASED_OUTPATIENT_CLINIC_OR_DEPARTMENT_OTHER): Payer: Self-pay | Admitting: Cardiology

## 2023-04-22 DIAGNOSIS — I10 Essential (primary) hypertension: Secondary | ICD-10-CM

## 2023-04-23 ENCOUNTER — Other Ambulatory Visit: Payer: Self-pay | Admitting: Cardiology

## 2023-04-23 DIAGNOSIS — I214 Non-ST elevation (NSTEMI) myocardial infarction: Secondary | ICD-10-CM

## 2023-04-24 ENCOUNTER — Telehealth: Payer: Self-pay | Admitting: Gastroenterology

## 2023-04-24 NOTE — Telephone Encounter (Signed)
 PT had a colonoscopy in July and would like to discuss the coding for the procedure. He said that it is usually preventative but he does have a history of polyps. He wants to know what can be done to change the coding. Please advise.

## 2023-04-26 NOTE — Telephone Encounter (Signed)
Patient is calling to follow up on previous message. Please advise.

## 2023-07-11 ENCOUNTER — Encounter: Payer: Self-pay | Admitting: Cardiology

## 2023-10-22 NOTE — Progress Notes (Unsigned)
 Cardiology Office Note:    Date:  10/23/2023   ID:  Jesus Cunningham, DOB Jul 15, 1950, MRN [id number]  PCP:  Myra Geni ORN, FNP  Cardiologist:  Lonni LITTIE Nanas, MD  Electrophysiologist:  None   Referring MD: Myra Geni ORN, FNP   Chief Complaint  Patient presents with   Coronary Artery Disease    History of Present Illness:    Jesus Cunningham is a 73 y.o. male with a hx of CAD status post CABG x3 (LIMA-LAD, SVG-PDA, SVG-OM2), hypertension, hyperlipidemia who presents for follow-up.  He he was admitted 03/2021 with NSTEMI, found to have severe multivessel CAD.  Echocardiogram 03/28/2021 showed normal biventricular function, grade 1 diastolic dysfunction, no significant valvular disease.  Zio patch x14 days on 08/12/2021 showed 1 episode of NSVT lasting 4 beats.  Since last clinic visit, he reports he is doing well.  Denies any chest pain, dyspnea, lightheadedness, syncope or palpitations.  Reports some lower extremity edema since his recent knee surgery.  He goes to gym twice per week and also works on a farm on weekends, denies any exertional symptoms.  He reports BP 120s over 70s when he checks at home.  BP Readings from Last 3 Encounters:  10/23/23 138/77  02/12/23 (!) 138/96  11/06/22 112/68     Past Medical History:  Diagnosis Date   Colon polyps    Hyperlipidemia    Hypertension     Past Surgical History:  Procedure Laterality Date   ANKLE FUSION     CORONARY ARTERY BYPASS GRAFT N/A 03/30/2021   Procedure: CORONARY ARTERY BYPASS GRAFTING (CABG) TIMES THREE USING LEFT INTERAL MAMMARY ARTERY, AND ENDOSCOPICALLY HARVESTED RIGHT GREATER SAPHENOUS VEIN.;  Surgeon: Shyrl Linnie KIDD, MD;  Location: [facility] OR;  Service: Open Heart Surgery;  Laterality: N/A;   ENDOVEIN HARVEST OF GREATER SAPHENOUS VEIN Right 03/30/2021   Procedure: ENDOVEIN HARVEST OF RIGHTGREATER SAPHENOUS VEIN;  Surgeon: Shyrl Linnie KIDD, MD;  Location: [facility] OR;  Service: Open Heart Surgery;   Laterality: Right;   LEFT HEART CATH AND CORONARY ANGIOGRAPHY N/A 03/28/2021   Procedure: LEFT HEART CATH AND CORONARY ANGIOGRAPHY;  Surgeon: Verlin Lonni BIRCH, MD;  Location: [facility] INVASIVE CV LAB;  Service: Cardiovascular;  Laterality: N/A;   TEE WITHOUT CARDIOVERSION N/A 03/30/2021   Procedure: TRANSESOPHAGEAL ECHOCARDIOGRAM (TEE);  Surgeon: Shyrl Linnie KIDD, MD;  Location: [facility] OR;  Service: Open Heart Surgery;  Laterality: N/A;    Current Medications: Current Meds  Medication Sig   acetaminophen  (TYLENOL ) 500 MG tablet Take 2 tablets (1,000 mg total) by mouth every 6 (six) hours as needed. Fever, mild pain   aspirin  EC 81 MG tablet Take 81 mg by mouth daily.   cetirizine (ZYRTEC) 10 MG tablet Take 10 mg by mouth daily as needed for allergies.   diphenhydrAMINE (BENADRYL) 25 MG tablet Take 25 mg by mouth every 6 (six) hours as needed for allergies.   ezetimibe  (ZETIA ) 10 MG tablet Take 1 tablet (10 mg total) by mouth daily. Please keep scheduled appointment for future refills. Thank you.   fluticasone (FLONASE) 50 MCG/ACT nasal spray Place 1 spray into both nostrils daily as needed for allergies or rhinitis.   hydrochlorothiazide  (MICROZIDE ) 12.5 MG capsule TAKE 1 CAPSULE BY MOUTH EVERY DAY   lisinopril  (ZESTRIL ) 40 MG tablet Take 1 tablet (40 mg total) by mouth daily. Please keep scheduled appointment for future refills. Thank  you.   meloxicam (MOBIC) 15 MG tablet TAKE (1) TABLET BY MOUTH DAILY WITH FOOD  rosuvastatin  (CRESTOR ) 20 MG tablet TAKE 1 TABLET BY MOUTH EVERY DAY   VIBERZI  100 MG TABS Take 100 mg by mouth daily.   zolpidem (AMBIEN) 10 MG tablet Take 10 mg by mouth at bedtime as needed.   Current Facility-Administered Medications for the 10/23/23 encounter (Office Visit) with Kate Lonni CROME, MD  Medication   0.9 %  sodium chloride  infusion     Allergies:   Patient has no known allergies.   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  Tobacco Use   Smoking status: Never   Smokeless tobacco: Former    Types: Chew    Quit date: 10/11/1981  Vaping Use   Vaping status: Never Used  Substance and Sexual Activity   Alcohol use: Yes    Alcohol/week: 4.0 standard drinks of alcohol    Types: 4 Glasses of wine per week    Comment: a glass a wine 2 a week   Drug use: No   Sexual activity: Not on file  Other Topics Concern   Not on file  Social History Narrative   Not on file   Social Drivers of Health   Financial Resource Strain: Not on file  Food Insecurity: Not on file  Transportation Needs: Not on file  Physical Activity: Not on file  Stress: Not on file  Social Connections: Not on file     Family History: The patient's family history includes Colon cancer in his mother; Diabetes in his father; Heart failure in his father; Hypertension in his brother; Stroke in his mother. There is no history of Stomach cancer or Esophageal cancer.  ROS:   Please see the history of present illness.     All other systems reviewed and are negative.  EKGs/Labs/Other Studies Reviewed:    The following studies were reviewed today:   EKG:   02/09/22: Sinus bradycardia, rate 53, no ST abnormalities 02/12/23: Sinus bradycardia, rate 58, no ST abnormalities 10/23/23: NSR with PVCs, rate 66, no ST abnormalities   Recent Labs: 02/12/2023: BUN 13; Creatinine, Ser 0.83; Magnesium  2.0; Potassium 4.4; Sodium 135  Recent Lipid Panel    Component Value Date/Time   CHOL 129 02/12/2023 1116   TRIG 52 02/12/2023 1116   HDL 74 02/12/2023 1116   CHOLHDL 1.7 02/12/2023 1116   CHOLHDL 2.1 03/28/2021 0230   VLDL 12 03/28/2021 0230   LDLCALC 43 02/12/2023 1116   LDLDIRECT 84 04/26/2021 1458    Physical Exam:    VS:  BP 138/77 (BP Location: Left Arm, Patient  Position: Sitting, Cuff Size: Normal)   Pulse 98   Wt 211 lb 6.4 oz (95.9 kg)   SpO2 98%   BMI 28.67 kg/m     Wt Readings from Last 3 Encounters:  10/23/23 211 lb 6.4 oz (95.9 kg)  02/12/23 209 lb 6.4 oz (95 kg)  11/06/22 214 lb (97.1 kg)     GEN:  Well nourished, well developed in no acute distress HEENT: Normal NECK: No JVD; No carotid bruits LYMPHATICS: No lymphadenopathy CARDIAC: RRR, no murmurs, rubs, gallops RESPIRATORY:  Clear to auscultation without rales, wheezing or rhonchi  ABDOMEN: Soft, non-tender, non-distended MUSCULOSKELETAL:  No edema; No deformity  SKIN: Warm and dry NEUROLOGIC:  Alert and oriented x 3 PSYCHIATRIC:  Normal affect   ASSESSMENT:    1. Coronary artery disease involving native coronary artery of native heart without angina pectoris   2. PVC (premature ventricular contraction)   3. Essential hypertension   4. Hyperlipidemia, unspecified hyperlipidemia type      PLAN:     CAD: status post CABG x3 (LIMA-LAD, SVG-PDA, SVG-OM2) on 03/30/2021.  He he was admitted 03/2021 with NSTEMI, found to have severe multivessel CAD.  Echocardiogram 03/28/2021 showed normal biventricular function, grade 1 diastolic dysfunction, no significant valvular disease. -Continue aspirin  81 mg daily.   -Continue rosuvastatin  20 mg daily -Metoprolol  was discontinued due to bradycardia  PVCs: Noted on EKG today.  Check BMET, magnesium , TSH.  Check Zio patch x 3 days to quantify PVC burden  Syncope: Occurred in January 2023, suspect due to dehydration.  Zio patch x14 days on 08/12/2021 showed 1 episode of NSVT lasting 4 beats.  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  No evidence of recurrence  Hypertension: On lisinopril  40 mg daily, amlodipine  5 mg daily, HCTZ 12.5 mg daily.    Hyperlipidemia: On rosuvastatin  20 mg daily and Zetia  10 mg daily.  LDL 43 on  01/2023  RTC in 6 months  Medication Adjustments/Labs and Tests Ordered: Current medicines are reviewed at length with the patient today.  Concerns regarding medicines are outlined above.  Orders Placed This Encounter  Procedures   Basic Metabolic Panel (BMET)   Magnesium    TSH   LONG TERM MONITOR (3-14 DAYS)   EKG 12-Lead   No orders of the defined types were placed in this encounter.   Patient Instructions  Medication Instructions:  Continue current medication  *If you need a refill on your cardiac medications before your next appointment, please call your pharmacy*  Lab Work: TSH, MG, BMET If you have labs (blood work) drawn today and your tests are completely normal, you will receive your results only by: MyChart Message (if you have MyChart) OR A paper copy in the mail If you have any lab test that is abnormal or we need to change your treatment, we will call you to review the results.  Testing/Procedures: ZIO  ZIO XT- Long Term Monitor Instructions  Your physician has requested you wear a ZIO patch monitor for 3 days.  This is a single patch monitor. [employer] supplies one patch monitor per enrollment. Additional stickers are not available. Please do not apply patch if you will be having a Nuclear Stress Test,  Echocardiogram, Cardiac CT, MRI, or Chest Xray during the period you would be wearing the  monitor. The patch cannot be worn during these tests. You cannot remove and re-apply the  ZIO XT patch monitor.  Your ZIO patch monitor will be mailed 3 day USPS to your address on file. It may take 3-5 days  to receive your monitor after you have been enrolled.  Once you have received your monitor, please review the enclosed instructions. Your monitor  has already been registered assigning a specific monitor serial # to you.  Billing and Patient Assistance Program Information  We have supplied [employer] with any of your insurance information on file for billing  purposes. [employer] offers a sliding scale Patient Assistance Program for patients that do not have  insurance, or whose insurance does not completely cover the cost of the ZIO monitor.  You must apply for the Patient Assistance Program to qualify for this discounted rate.  To apply, please call [employer] at [phone], select option 4, select option 2, ask to apply for  Patient Assistance Program. [name] will ask your household income, and how many people  are in your household. They will quote your out-of-pocket cost based on that information.  [employer] will also be able to set up a 56-month, interest-free payment plan if needed.  Applying the monitor   Shave hair from upper left chest.  Hold abrader disc by orange tab. Rub abrader in 40 strokes over the upper left chest as  indicated in your monitor instructions.  Clean area with 4 enclosed alcohol pads. Let dry.  Apply patch as indicated in monitor instructions. Patch will be placed under collarbone on left  side of chest with arrow pointing upward.  Rub patch adhesive wings for 2 minutes. Remove white label marked 1. Remove the white  label marked 2. Rub patch adhesive wings for 2 additional minutes.  While looking in a mirror, press and release button in center of patch. A small green light will  flash 3-4 times. This will be your only indicator that the monitor has been turned on.  Do not shower for the first 24 hours. You may shower after the first 24 hours.  Press the button if you feel a symptom. You will hear a small click. Record Date, Time and  Symptom in the Patient Logbook.  When you are ready to remove the patch, follow instructions on the last 2 pages of Patient  Logbook. Stick patch monitor onto the last page of Patient Logbook.  Place Patient Logbook in the blue and white box. Use locking tab on box and tape box closed  securely. The blue and white box has prepaid postage on it. Please place it in the mailbox as  soon  as possible. Your physician should have your test results approximately 7 days after the  monitor has been mailed back to [facility].  Call [facility] Customer Care at [phone] if you have questions regarding  your ZIO XT patch monitor. Call them immediately if you see an orange light blinking on your  monitor.  If your monitor falls off in less than 4 days, contact our Monitor department at [phone].  If your monitor becomes loose or falls off after 4 days call [employer] at [phone] for  suggestions on securing your monitor   Follow-Up: At [facility], you and your health needs are our priority.  As part of our continuing mission to provide you with exceptional heart care, our providers are all part of one team.  This team includes your primary Cardiologist (physician) and Advanced Practice Providers or APPs (Physician Assistants and Nurse Practitioners) who all work together to provide you with the care you need, when you need it.  Your next appointment:   6 month(s)  Provider:   Lonni LITTIE Nanas, MD    We recommend signing up for the patient portal called MyChart.  Sign up information is provided on this After Visit Summary.  MyChart is used to connect with patients for Virtual Visits (Telemedicine).  Patients are able to view lab/test results, encounter notes, upcoming appointments, etc.  Non-urgent messages can be sent to your provider as well.   To learn more about what you can do with MyChart, go to [email or web address].   Other Instructions NONE       Signed, Lonni LITTIE Nanas, MD  10/23/2023 5:50 PM    [facility]

## 2023-10-23 ENCOUNTER — Ambulatory Visit

## 2023-10-23 ENCOUNTER — Ambulatory Visit: Attending: Cardiology | Admitting: Cardiology

## 2023-10-23 VITALS — BP 138/77 | HR 98 | Wt 211.4 lb

## 2023-10-23 DIAGNOSIS — I493 Ventricular premature depolarization: Secondary | ICD-10-CM

## 2023-10-23 DIAGNOSIS — E785 Hyperlipidemia, unspecified: Secondary | ICD-10-CM

## 2023-10-23 DIAGNOSIS — I10 Essential (primary) hypertension: Secondary | ICD-10-CM

## 2023-10-23 DIAGNOSIS — I2510 Atherosclerotic heart disease of native coronary artery without angina pectoris: Secondary | ICD-10-CM

## 2023-10-23 NOTE — Patient Instructions (Addendum)
 Medication Instructions:  Continue current medication  *If you need a refill on your cardiac medications before your next appointment, please call your pharmacy*  Lab Work: TSH, MG, BMET If you have labs (blood work) drawn today and your tests are completely normal, you will receive your results only by: MyChart Message (if you have MyChart) OR A paper copy in the mail If you have any lab test that is abnormal or we need to change your treatment, we will call you to review the results.  Testing/Procedures: ZIO  ZIO XT- Long Term Monitor Instructions  Your physician has requested you wear a ZIO patch monitor for 3 days.  This is a single patch monitor. [employer] supplies one patch monitor per enrollment. Additional stickers are not available. Please do not apply patch if you will be having a Nuclear Stress Test,  Echocardiogram, Cardiac CT, MRI, or Chest Xray during the period you would be wearing the  monitor. The patch cannot be worn during these tests. You cannot remove and re-apply the  ZIO XT patch monitor.  Your ZIO patch monitor will be mailed 3 day USPS to your address on file. It may take 3-5 days  to receive your monitor after you have been enrolled.  Once you have received your monitor, please review the enclosed instructions. Your monitor  has already been registered assigning a specific monitor serial # to you.  Billing and Patient Assistance Program Information  We have supplied [employer] with any of your insurance information on file for billing purposes. [employer] offers a sliding scale Patient Assistance Program for patients that do not have  insurance, or whose insurance does not completely cover the cost of the ZIO monitor.  You must apply for the Patient Assistance Program to qualify for this discounted rate.  To apply, please call [employer] at [phone], select option 4, select option 2, ask to apply for  Patient Assistance Program. Jesus Cunningham will ask your household  income, and how many people  are in your household. They will quote your out-of-pocket cost based on that information.  [employer] will also be able to set up a 8-month, interest-free payment plan if needed.  Applying the monitor   Shave hair from upper left chest.  Hold abrader disc by orange tab. Rub abrader in 40 strokes over the upper left chest as  indicated in your monitor instructions.  Clean area with 4 enclosed alcohol pads. Let dry.  Apply patch as indicated in monitor instructions. Patch will be placed under collarbone on left  side of chest with arrow pointing upward.  Rub patch adhesive wings for 2 minutes. Remove white label marked 1. Remove the white  label marked 2. Rub patch adhesive wings for 2 additional minutes.  While looking in a mirror, press and release button in center of patch. A small green light will  flash 3-4 times. This will be your only indicator that the monitor has been turned on.  Do not shower for the first 24 hours. You may shower after the first 24 hours.  Press the button if you feel a symptom. You will hear a small click. Record Date, Time and  Symptom in the Patient Logbook.  When you are ready to remove the patch, follow instructions on the last 2 pages of Patient  Logbook. Stick patch monitor onto the last page of Patient Logbook.  Place Patient Logbook in the blue and white box. Use locking tab on box and tape box closed  securely. The blue  and white box has prepaid postage on it. Please place it in the mailbox as  soon as possible. Your physician should have your test results approximately 7 days after the  monitor has been mailed back to [facility].  Call [facility] Customer Care at [phone] if you have questions regarding  your ZIO XT patch monitor. Call them immediately if you see an orange light blinking on your  monitor.  If your monitor falls off in less than 4 days, contact our Monitor department at [phone].  If your  monitor becomes loose or falls off after 4 days call [employer] at [phone] for  suggestions on securing your monitor   Follow-Up: At [facility], you and your health needs are our priority.  As part of our continuing mission to provide you with exceptional heart care, our providers are all part of one team.  This team includes your primary Cardiologist (physician) and Advanced Practice Providers or APPs (Physician Assistants and Nurse Practitioners) who all work together to provide you with the care you need, when you need it.  Your next appointment:   6 month(s)  Provider:   Lonni LITTIE Nanas, MD    We recommend signing up for the patient portal called MyChart.  Sign up information is provided on this After Visit Summary.  MyChart is used to connect with patients for Virtual Visits (Telemedicine).  Patients are able to view lab/test results, encounter notes, upcoming appointments, etc.  Non-urgent messages can be sent to your provider as well.   To learn more about what you can do with MyChart, go to [email or web address].   Other Instructions NONE

## 2023-10-23 NOTE — Progress Notes (Unsigned)
 Enrolled patient for a 3 day Zio XT monitor to be mailed to patients home

## 2023-10-24 ENCOUNTER — Ambulatory Visit: Payer: Self-pay | Admitting: Cardiology

## 2023-10-24 DIAGNOSIS — I5189 Other ill-defined heart diseases: Secondary | ICD-10-CM

## 2023-10-24 DIAGNOSIS — I10 Essential (primary) hypertension: Secondary | ICD-10-CM

## 2023-10-30 LAB — BASIC METABOLIC PANEL WITH GFR
BUN/Creatinine Ratio: 15 (ref 10–24)
BUN: 13 mg/dL (ref 8–27)
Calcium: 9.1 mg/dL (ref 8.6–10.2)
Chloride: 105 mmol/L (ref 96–106)
Creatinine, Ser: 0.89 mg/dL (ref 0.76–1.27)
Glucose: 97 mg/dL (ref 70–99)
Potassium: 4.5 mmol/L (ref 3.5–5.2)
Sodium: 142 mmol/L (ref 134–144)
eGFR: 91 mL/min/1.73 (ref >59)

## 2023-10-30 LAB — TSH: TSH: 1.45 u[IU]/mL (ref 0.450–4.500)

## 2023-10-30 LAB — MAGNESIUM: Magnesium: 2.2 mg/dL (ref 1.6–2.3)

## 2023-11-10 DIAGNOSIS — I493 Ventricular premature depolarization: Secondary | ICD-10-CM

## 2023-12-19 ENCOUNTER — Ambulatory Visit (HOSPITAL_COMMUNITY)
Admission: RE | Admit: 2023-12-19 | Discharge: 2023-12-19 | Disposition: A | Discharge status: Home / Self Care | Source: Ambulatory Visit | Attending: Internal Medicine | Admitting: Internal Medicine

## 2023-12-19 DIAGNOSIS — I5189 Other ill-defined heart diseases: Secondary | ICD-10-CM | POA: Insufficient documentation

## 2023-12-19 LAB — ECHOCARDIOGRAM COMPLETE: S' Lateral: 2.86 cm

## 2023-12-20 ENCOUNTER — Ambulatory Visit: Payer: Self-pay | Admitting: Cardiology

## 2023-12-20 DIAGNOSIS — I77819 Aortic ectasia, unspecified site: Secondary | ICD-10-CM

## 2024-01-19 ENCOUNTER — Other Ambulatory Visit: Payer: Self-pay | Admitting: Cardiology

## 2024-01-19 DIAGNOSIS — I214 Non-ST elevation (NSTEMI) myocardial infarction: Secondary | ICD-10-CM

## 2024-03-18 ENCOUNTER — Other Ambulatory Visit: Payer: Self-pay | Admitting: Cardiology

## 2024-03-22 ENCOUNTER — Encounter: Payer: Self-pay | Admitting: Cardiology

## 2024-03-23 ENCOUNTER — Other Ambulatory Visit: Payer: Self-pay

## 2024-03-23 ENCOUNTER — Inpatient Hospital Stay (HOSPITAL_COMMUNITY)
Admission: EM | Admit: 2024-03-23 | Discharge: 2024-03-25 | DRG: 281 | Disposition: A | Attending: Cardiology | Admitting: Cardiology

## 2024-03-23 ENCOUNTER — Emergency Department (HOSPITAL_COMMUNITY)

## 2024-03-23 ENCOUNTER — Encounter (HOSPITAL_COMMUNITY): Payer: Self-pay | Admitting: Emergency Medicine

## 2024-03-23 ENCOUNTER — Encounter (HOSPITAL_COMMUNITY): Admission: EM | Disposition: A | Payer: Self-pay | Source: Home / Self Care | Attending: Cardiology

## 2024-03-23 DIAGNOSIS — I2510 Atherosclerotic heart disease of native coronary artery without angina pectoris: Secondary | ICD-10-CM | POA: Diagnosis not present

## 2024-03-23 DIAGNOSIS — I213 ST elevation (STEMI) myocardial infarction of unspecified site: Principal | ICD-10-CM

## 2024-03-23 DIAGNOSIS — I25810 Atherosclerosis of coronary artery bypass graft(s) without angina pectoris: Secondary | ICD-10-CM | POA: Diagnosis not present

## 2024-03-23 DIAGNOSIS — I2111 ST elevation (STEMI) myocardial infarction involving right coronary artery: Secondary | ICD-10-CM | POA: Diagnosis present

## 2024-03-23 DIAGNOSIS — E782 Mixed hyperlipidemia: Secondary | ICD-10-CM | POA: Diagnosis not present

## 2024-03-23 DIAGNOSIS — I10 Essential (primary) hypertension: Secondary | ICD-10-CM | POA: Diagnosis not present

## 2024-03-23 HISTORY — PX: LEFT HEART CATH AND CORONARY ANGIOGRAPHY: CATH118249

## 2024-03-23 HISTORY — PX: CORONARY/GRAFT ACUTE MI REVASCULARIZATION: CATH118305

## 2024-03-23 LAB — COMPREHENSIVE METABOLIC PANEL WITH GFR
ALT: 32 U/L (ref 0–44)
AST: 100 U/L — ABNORMAL HIGH (ref 15–41)
Albumin: 3.7 g/dL (ref 3.5–5.0)
Alkaline Phosphatase: 64 U/L (ref 38–126)
Anion gap: 10 (ref 5–15)
BUN: 13 mg/dL (ref 8–23)
CO2: 21 mmol/L — ABNORMAL LOW (ref 22–32)
Calcium: 8.7 mg/dL — ABNORMAL LOW (ref 8.9–10.3)
Chloride: 102 mmol/L (ref 98–111)
Creatinine, Ser: 0.81 mg/dL (ref 0.61–1.24)
GFR, Estimated: >60 mL/min (ref >60)
Glucose, Bld: 107 mg/dL — ABNORMAL HIGH (ref 70–99)
Potassium: 4.1 mmol/L (ref 3.5–5.1)
Sodium: 133 mmol/L — ABNORMAL LOW (ref 135–145)
Total Bilirubin: 1.1 mg/dL (ref 0.0–1.2)
Total Protein: 6.8 g/dL (ref 6.5–8.1)

## 2024-03-23 LAB — CBC WITH DIFFERENTIAL/PLATELET
Abs Immature Granulocytes: 0.02 K/uL (ref 0.00–0.07)
Basophils Absolute: 0 K/uL (ref 0.0–0.1)
Basophils Relative: 0 %
Eosinophils Absolute: 0.1 K/uL (ref 0.0–0.5)
Eosinophils Relative: 1 %
HCT: 41 % (ref 39.0–52.0)
Hemoglobin: 14.3 g/dL (ref 13.0–17.0)
Immature Granulocytes: 0 %
Lymphocytes Relative: 16 %
Lymphs Abs: 1 K/uL (ref 0.7–4.0)
MCH: 32 pg (ref 26.0–34.0)
MCHC: 34.9 g/dL (ref 30.0–36.0)
MCV: 91.7 fL (ref 80.0–100.0)
Monocytes Absolute: 0.6 K/uL (ref 0.1–1.0)
Monocytes Relative: 10 %
Neutro Abs: 4.6 K/uL (ref 1.7–7.7)
Neutrophils Relative %: 73 %
Platelets: 247 K/uL (ref 150–400)
RBC: 4.47 MIL/uL (ref 4.22–5.81)
RDW: 12.8 % (ref 11.5–15.5)
WBC: 6.4 K/uL (ref 4.0–10.5)
nRBC: 0 % (ref 0.0–0.2)

## 2024-03-23 LAB — TROPONIN I (HIGH SENSITIVITY)
Troponin I (High Sensitivity): 7522 ng/L (ref <18)
Troponin I (High Sensitivity): 15472 ng/L (ref <18)
Troponin I (High Sensitivity): >24000 ng/L (ref <18)

## 2024-03-23 LAB — CBC
HCT: 43 % (ref 39.0–52.0)
Hemoglobin: 15.2 g/dL (ref 13.0–17.0)
MCH: 32.1 pg (ref 26.0–34.0)
MCHC: 35.3 g/dL (ref 30.0–36.0)
MCV: 90.9 fL (ref 80.0–100.0)
Platelets: 262 K/uL (ref 150–400)
RBC: 4.73 MIL/uL (ref 4.22–5.81)
RDW: 12.8 % (ref 11.5–15.5)
WBC: 6.4 K/uL (ref 4.0–10.5)
nRBC: 0 % (ref 0.0–0.2)

## 2024-03-23 LAB — BASIC METABOLIC PANEL WITH GFR
Anion gap: 9 (ref 5–15)
BUN: 13 mg/dL (ref 8–23)
CO2: 23 mmol/L (ref 22–32)
Calcium: 9 mg/dL (ref 8.9–10.3)
Chloride: 101 mmol/L (ref 98–111)
Creatinine, Ser: 0.83 mg/dL (ref 0.61–1.24)
GFR, Estimated: >60 mL/min (ref >60)
Glucose, Bld: 109 mg/dL — ABNORMAL HIGH (ref 70–99)
Potassium: 4.1 mmol/L (ref 3.5–5.1)
Sodium: 133 mmol/L — ABNORMAL LOW (ref 135–145)

## 2024-03-23 LAB — LIPID PANEL
Cholesterol: 131 mg/dL (ref 0–200)
HDL: 74 mg/dL (ref >40)
LDL Cholesterol: 45 mg/dL (ref 0–99)
Total CHOL/HDL Ratio: 1.8 ratio
Triglycerides: 59 mg/dL (ref <150)
VLDL: 12 mg/dL (ref 0–40)

## 2024-03-23 LAB — I-STAT CG4 LACTIC ACID, ED: Lactic Acid, Venous: 1.5 mmol/L (ref 0.5–1.9)

## 2024-03-23 LAB — HEMOGLOBIN A1C
Hgb A1c MFr Bld: 5.3 % (ref 4.8–5.6)
Mean Plasma Glucose: 105.41 mg/dL

## 2024-03-23 LAB — MRSA NEXT GEN BY PCR, NASAL: MRSA by PCR Next Gen: NOT DETECTED

## 2024-03-23 LAB — APTT: aPTT: 117 s — ABNORMAL HIGH (ref 24–36)

## 2024-03-23 LAB — PROTIME-INR
INR: 1.2 (ref 0.8–1.2)
Prothrombin Time: 15.4 s — ABNORMAL HIGH (ref 11.4–15.2)

## 2024-03-23 SURGERY — CORONARY/GRAFT ACUTE MI REVASCULARIZATION
Anesthesia: LOCAL

## 2024-03-23 MED ORDER — CLOPIDOGREL BISULFATE 300 MG PO TABS: 600.0000 mg | ORAL_TABLET | Freq: Once | ORAL | Status: DC

## 2024-03-23 MED ORDER — ENOXAPARIN SODIUM 40 MG/0.4ML IJ SOSY: 40.0000 mg | PREFILLED_SYRINGE | INTRAMUSCULAR | Status: DC

## 2024-03-23 MED ORDER — LISINOPRIL 20 MG PO TABS: 40.0000 mg | ORAL_TABLET | Freq: Every day | ORAL | Status: DC

## 2024-03-23 MED ORDER — AMLODIPINE BESYLATE 10 MG PO TABS: 10.0000 mg | ORAL_TABLET | Freq: Every day | ORAL | Status: DC

## 2024-03-23 MED ORDER — HEPARIN SODIUM (PORCINE) 1000 UNIT/ML IJ SOLN
INTRAMUSCULAR | Status: DC | PRN
  Administered 2024-03-23: 5000 [IU] via INTRAVENOUS

## 2024-03-23 MED ORDER — NITROGLYCERIN 0.4 MG SL SUBL: 0.4000 mg | SUBLINGUAL_TABLET | SUBLINGUAL | Status: DC | PRN

## 2024-03-23 MED ORDER — MIDAZOLAM HCL (PF) 2 MG/2ML IJ SOLN
INTRAMUSCULAR | Status: DC | PRN
  Administered 2024-03-23: 1 mg via INTRAVENOUS

## 2024-03-23 MED ORDER — ASPIRIN 81 MG PO CHEW
324.0000 mg | CHEWABLE_TABLET | Freq: Once | ORAL | Status: AC
  Administered 2024-03-23: 324 mg via ORAL
  Filled 2024-03-23: qty 4

## 2024-03-23 MED ORDER — FENTANYL CITRATE (PF) 100 MCG/2ML IJ SOLN
INTRAMUSCULAR | Status: DC | PRN
  Administered 2024-03-23: 25 ug via INTRAVENOUS

## 2024-03-23 MED ORDER — SODIUM CHLORIDE 0.9% FLUSH: 3.0000 mL | INTRAVENOUS | Status: DC | PRN

## 2024-03-23 MED ORDER — VERAPAMIL HCL 2.5 MG/ML IV SOLN
INTRAVENOUS | Status: DC | PRN
  Administered 2024-03-23: 10 mL via INTRA_ARTERIAL

## 2024-03-23 MED ORDER — HEPARIN BOLUS VIA INFUSION
4000.0000 [IU] | Freq: Once | INTRAVENOUS | Status: AC
  Administered 2024-03-23: 4000 [IU] via INTRAVENOUS
  Filled 2024-03-23: qty 4000

## 2024-03-23 MED ORDER — ASPIRIN 81 MG PO TBEC: 81.0000 mg | DELAYED_RELEASE_TABLET | Freq: Every day | ORAL | Status: DC

## 2024-03-23 MED ORDER — ASPIRIN 81 MG PO CHEW: 324.0000 mg | CHEWABLE_TABLET | ORAL | Status: DC

## 2024-03-23 MED ORDER — IOHEXOL 350 MG/ML SOLN
INTRAVENOUS | Status: DC | PRN
  Administered 2024-03-23: 95 mL

## 2024-03-23 MED ORDER — ASPIRIN 81 MG PO TBEC
81.0000 mg | DELAYED_RELEASE_TABLET | Freq: Every day | ORAL | Status: DC
  Administered 2024-03-24 – 2024-03-25 (×2): 81 mg via ORAL
  Filled 2024-03-23 (×2): qty 1

## 2024-03-23 MED ORDER — SODIUM CHLORIDE 0.9% FLUSH
3.0000 mL | Freq: Two times a day (BID) | INTRAVENOUS | Status: DC
  Administered 2024-03-23 – 2024-03-25 (×5): 3 mL via INTRAVENOUS

## 2024-03-23 MED ORDER — HEPARIN (PORCINE) IN NACL 1000-0.9 UT/500ML-% IV SOLN
INTRAVENOUS | Status: DC | PRN
  Administered 2024-03-23 (×2): 500 mL

## 2024-03-23 MED ORDER — FREE WATER: 500.0000 mL | Freq: Once | Status: DC

## 2024-03-23 MED ORDER — SODIUM CHLORIDE 0.9 % IV SOLN: 250.0000 mL | INTRAVENOUS | Status: AC | PRN

## 2024-03-23 MED ORDER — LIDOCAINE HCL (PF) 1 % IJ SOLN
INTRAMUSCULAR | Status: DC | PRN
  Administered 2024-03-23: 2 mL via INTRADERMAL

## 2024-03-23 MED ORDER — CLOPIDOGREL BISULFATE 75 MG PO TABS
75.0000 mg | ORAL_TABLET | Freq: Every day | ORAL | Status: DC
  Administered 2024-03-23: 75 mg via ORAL
  Filled 2024-03-23: qty 1

## 2024-03-23 MED ORDER — HEPARIN (PORCINE) 25000 UT/250ML-% IV SOLN
1200.0000 [IU]/h | INTRAVENOUS | Status: DC
  Administered 2024-03-23: 1200 [IU]/h via INTRAVENOUS
  Filled 2024-03-23: qty 250

## 2024-03-23 MED ORDER — EZETIMIBE 10 MG PO TABS
10.0000 mg | ORAL_TABLET | Freq: Every day | ORAL | Status: DC
  Administered 2024-03-23 – 2024-03-24 (×2): 10 mg via ORAL
  Filled 2024-03-23 (×2): qty 1

## 2024-03-23 MED ORDER — ACETAMINOPHEN 325 MG PO TABS: 650.0000 mg | ORAL_TABLET | ORAL | Status: DC | PRN

## 2024-03-23 MED ORDER — NITROGLYCERIN 1 MG/10 ML FOR IR/CATH LAB
INTRA_ARTERIAL | Status: AC
  Filled 2024-03-23: qty 10

## 2024-03-23 MED ORDER — ASPIRIN 300 MG RE SUPP: 300.0000 mg | RECTAL | Status: DC

## 2024-03-23 MED ORDER — ONDANSETRON HCL 4 MG/2ML IJ SOLN: 4.0000 mg | Freq: Four times a day (QID) | INTRAMUSCULAR | Status: DC | PRN

## 2024-03-23 MED ORDER — ELUXADOLINE 100 MG PO TABS: 100.0000 mg | ORAL_TABLET | Freq: Every day | ORAL | Status: DC

## 2024-03-23 MED ORDER — ROSUVASTATIN CALCIUM 20 MG PO TABS
20.0000 mg | ORAL_TABLET | Freq: Every day | ORAL | Status: DC
  Administered 2024-03-23 – 2024-03-24 (×2): 20 mg via ORAL
  Filled 2024-03-23 (×2): qty 1

## 2024-03-23 SURGICAL SUPPLY — 12 items
CATH 5FR JL3.5 JR4 ANG PIG MP (CATHETERS) IMPLANT
CATH ANGIO 5F BER 100CM (CATHETERS) IMPLANT
CATH CROSS OVER TEMPO 5F (CATHETERS) IMPLANT
CATH INFINITI 5 FR IM (CATHETERS) IMPLANT
CATH INFINITI 5FR JL4 (CATHETERS) IMPLANT
DEVICE RAD COMP TR BAND LRG (VASCULAR PRODUCTS) IMPLANT
GUIDEWIRE INQWIRE 1.5J.035X260 (WIRE) IMPLANT
PACK CARDIAC CATHETERIZATION (CUSTOM PROCEDURE TRAY) ×1 IMPLANT
SHEATH PINNACLE 6F 10CM (SHEATH) IMPLANT
SHEATH PROBE COVER 6X72 (BAG) IMPLANT
STATION PROTECTION PRESSURIZED (MISCELLANEOUS) IMPLANT
TUBING CIL FLEX 10 FLL-RA (TUBING) IMPLANT

## 2024-03-23 NOTE — Progress Notes (Signed)
   03/23/24 1237  Spiritual Encounters  Type of Visit Initial  Reason for visit Code  OnCall Visit Yes   Chaplain responded to Code STEMI. Per PA, Patient and family member were doing okay and after two doctors visited with Patient, he was then taken to Cath Lab.Chaplain remains available upon request.  Chaplain Kamran Coker

## 2024-03-23 NOTE — ED Triage Notes (Signed)
 Pt reports feeling off for a week. Yesterday around noon, pt felt chest pressure on left side. Also reports BP was elevated at 150 and took extra lisinopril . Pt was woken up from sleep at 1 am due to the pain and had to prop himself up due to the pain. Denies SOB.

## 2024-03-23 NOTE — H&P (Signed)
 Cardiology Admission History and Physical   Patient ID: Jesus Jesus MRN: [id number]; DOB: 11/30/1950   Admission date: 03/23/2024  PCP:  Jesus Geni ORN, FNP   [facility] Providers Cardiologist:  Jesus Jesus, Jesus Jesus       Chief Complaint:  gillermina  Patient Profile: Jesus Jesus is a 73 y.o. male with history of CAD s/p CABG who is being seen 03/23/2024 for the evaluation of STEMI.  History of Present Illness: Jesus Jesus has history of HTN and HLD. He is s/p small NSTEMI in Dec 2022 with cardiac cath demonstrating severe left main and 3 vessel CAD. He underwent CABG by Dr Jesus with LIMA to LAD, SVG to OM1, and SVG to PDA. He has done well since then with normal EF.   Yesterday at noon was loaded some feed on his truck when he developed pressure across his anterior chest. No radiation, SOB, N/V. Noted BP was elevated so he took extra lisinopril . Did not have Ntg to take. Pain seemed to ease but then returned again in the evening and persisted. Decided to seek attention today. Now he has minimal discomfort and Ecg shows mild ST elevation inferiorly with new Q waves.    Past Medical History:  Diagnosis Date   Colon polyps    Hyperlipidemia    Hypertension    Past Surgical History:  Procedure Laterality Date   ANKLE FUSION     CORONARY ARTERY BYPASS GRAFT N/A 03/30/2021   Procedure: CORONARY ARTERY BYPASS GRAFTING (CABG) TIMES THREE USING LEFT INTERAL MAMMARY ARTERY, AND ENDOSCOPICALLY HARVESTED RIGHT GREATER SAPHENOUS VEIN.;  Surgeon: Jesus Jesus, Jesus Jesus;  Location: [facility] OR;  Service: Open Heart Surgery;  Laterality: N/A;   ENDOVEIN HARVEST OF GREATER SAPHENOUS VEIN Right 03/30/2021   Procedure: ENDOVEIN HARVEST OF RIGHTGREATER SAPHENOUS VEIN;  Surgeon: Jesus Jesus, Jesus Jesus;  Location: [facility] OR;  Service: Open Heart Surgery;  Laterality: Right;   LEFT HEART CATH AND CORONARY ANGIOGRAPHY N/A 03/28/2021   Procedure: LEFT HEART CATH AND CORONARY  ANGIOGRAPHY;  Surgeon: Jesus Jesus, Jesus Jesus;  Location: [facility] INVASIVE CV LAB;  Service: Cardiovascular;  Laterality: N/A;   TEE WITHOUT CARDIOVERSION N/A 03/30/2021   Procedure: TRANSESOPHAGEAL ECHOCARDIOGRAM (TEE);  Surgeon: Jesus Jesus, Jesus Jesus;  Location: [facility] OR;  Service: Open Heart Surgery;  Laterality: N/A;     Medications Prior to Admission: Prior to Admission medications   Medication Sig Start Date End Date Taking? Authorizing Provider  acetaminophen  (TYLENOL ) 500 MG tablet Take 2 tablets (1,000 mg total) by mouth every 6 (six) hours as needed. Fever, mild pain 04/03/21   Barrett, Jesus Jesus  amLODipine  (NORVASC ) 10 MG tablet TAKE ONE TABLET BY MOUTH ONCE DAILY 03/20/24   Jesus Jesus, Jesus Jesus  aspirin  EC 81 MG tablet Take 81 mg by mouth daily.    Provider, Historical, Jesus Jesus  cetirizine (ZYRTEC) 10 MG tablet Take 10 mg by mouth daily as needed for allergies.    Provider, Historical, Jesus Jesus  diphenhydrAMINE (BENADRYL) 25 MG tablet Take 25 mg by mouth every 6 (six) hours as needed for allergies.    Provider, Historical, Jesus Jesus  ezetimibe  (ZETIA ) 10 MG tablet Take 1 tablet (10 mg total) by mouth daily. Please keep scheduled appointment for future refills. Thank you. 01/25/23   Jesus Jesus, Jesus Jesus  fluticasone (FLONASE) 50 MCG/ACT nasal spray Place 1 spray into both nostrils daily as needed for allergies or rhinitis.    Provider, Historical, Jesus Jesus  hydrochlorothiazide  (MICROZIDE ) 12.5 MG capsule  TAKE 1 CAPSULE BY MOUTH EVERY DAY 04/24/23   Jesus Jesus, Jesus Jesus  lisinopril  (ZESTRIL ) 40 MG tablet Take 1 tablet (40 mg total) by mouth daily. Please keep scheduled appointment for future refills. Thank  you. 01/25/23   Jesus Jesus, Jesus Jesus  meloxicam (MOBIC) 15 MG tablet TAKE (1) TABLET BY MOUTH DAILY WITH FOOD 07/22/21   Provider, Historical, Jesus Jesus  rosuvastatin  (CRESTOR ) 20 MG tablet TAKE 1 TABLET BY MOUTH EVERY DAY 01/21/24   Jesus Jesus, Jesus Jesus  VIBERZI  100 MG TABS Take  100 mg by mouth daily. 03/07/21   Provider, Historical, Jesus Jesus  zolpidem (AMBIEN) 10 MG tablet Take 10 mg by mouth at bedtime as needed. 08/29/21   Provider, Historical, Jesus Jesus     Allergies:   No Known Allergies  Social History:   Social History   Socioeconomic History   Marital status: Married    Spouse name: Not on file   Number of children: 2   Years of education: Not on file   Highest education level: Not on file  Occupational History   Occupation: [employer] manager  Tobacco Use   Smoking status: Never   Smokeless tobacco: Former    Types: Chew    Quit date: 10/11/1981  Vaping Use   Vaping status: Never Used  Substance and Sexual Activity   Alcohol use: Yes    Alcohol/week: 4.0 standard drinks of alcohol    Types: 4 Glasses of wine per week    Comment: a glass a wine 2 a week   Drug use: No   Sexual activity: Not on file  Other Topics Concern   Not on file  Social History Narrative   Not on file   Social Drivers of Health   Financial Resource Strain: Not on file  Food Insecurity: Not on file  Transportation Needs: Not on file  Physical Activity: Not on file  Stress: Not on file  Social Connections: Not on file  Intimate Partner Violence: Not on file     Family History:   The patient's family history includes Colon cancer in his mother; Diabetes in his father; Heart failure in his father; Hypertension in his brother; Stroke in his mother. There is no history of Stomach cancer or Esophageal cancer.    ROS:  Please see the history of present illness.  All other ROS reviewed and negative.     Physical Exam/Data: Vitals:   03/23/24 1043 03/23/24 1130 03/23/24 1145 03/23/24 1215  BP:  114/74 128/75 111/69  Pulse:  (!) 52 (!) 55 (!) 57  Resp:  16 (!) 21 14  Temp:      SpO2:  100% 100% 100%  Weight: 95.9 kg     Height: 6' (1.829 m)      No intake or output data in the 24 hours ending 03/23/24 1404    03/23/2024   10:43 AM 10/23/2023    3:22 PM 02/12/2023   10:10 AM   Last 3 Weights  Weight (lbs) 211 lb 6.7 oz 211 lb 6.4 oz 209 lb 6.4 oz  Weight (kg) 95.9 kg 95.89 kg 94.983 kg     Body mass index is 28.67 kg/m.  General:  Well nourished, well developed, in no acute distress HEENT: normal Neck: no JVD Vascular: No carotid bruits; Distal pulses 2+ bilaterally   Cardiac:  normal S1, S2; RRR; no murmur  Lungs:  clear to auscultation bilaterally, no wheezing, rhonchi or rales  Abd: soft, nontender, no hepatomegaly  Ext: no edema  Musculoskeletal:  No deformities, BUE and BLE strength normal and equal Skin: warm and dry  Neuro:  CNs 2-12 intact, no focal abnormalities noted Psych:  Normal affect   EKG:  The ECG that was done today was personally reviewed and demonstrates NSR with new Q waves inferiorly, mild residual ST elevation  Relevant CV Studies: Echo 12/19/23: IMPRESSIONS     1. Left ventricular ejection fraction, by estimation, is 55 to 60%. Left  ventricular ejection fraction by 3D volume is 58 %. The left ventricle has  normal function. The left ventricle demonstrates regional wall motion  abnormalities (see scoring  diagram/findings for description). There is mild left ventricular  hypertrophy. Left ventricular diastolic parameters are indeterminate.   2. Right ventricular systolic function is mildly reduced. The right  ventricular size is mildly enlarged.   3. Left atrial size was mildly dilated.   4. The mitral valve is grossly normal. Trivial mitral valve  regurgitation. No evidence of mitral stenosis.   5. The aortic valve is tricuspid. Aortic valve regurgitation is trivial.  No aortic stenosis is present.   6. Aortic dilatation noted. There is mild dilatation of the ascending  aorta, measuring 44 mm.   Event monitor 11/07/23: Study Highlights Show Result Comparison    1 episode of NSVT lasting 8 beats   Occasional PVCs (3.5%)   Occasional PACs (1.8%)     Patch Wear Time:  3 days and 0 hours (2025-07-14T06:27:56-0400 to  2025-07-17T06:42:16-0400)   Patient had a min HR of 44 bpm, max HR of 154 bpm, and avg HR of 64 bpm. Predominant underlying rhythm was Sinus Rhythm. First Degree AV Block was present. 1 run of Ventricular Tachycardia occurred lasting 8 beats with a max rate of 154 bpm (avg 134  bpm). Isolated SVEs were occasional (1.8%, 5043), and no SVE Couplets or SVE Triplets were present. Isolated VEs were occasional (3.5%, 9572), VE Couplets were rare (<1.0%, 75), and VE Triplets were rare (<1.0%, 6). Ventricular Bigeminy and Trigeminy  were present.  No patient triggered events   Laboratory Data: High Sensitivity Troponin:   Recent Labs  Lab 03/23/24 1045 03/23/24 1226  TROPONINIHS 7,522* 15,472*      Chemistry Recent Labs  Lab 03/23/24 1045 03/23/24 1226  NA 133* 133*  K 4.1 4.1  CL 101 102  CO2 23 21*  GLUCOSE 109* 107*  BUN 13 13  CREATININE 0.83 0.81  CALCIUM  9.0 8.7*  GFRNONAA >60 >60  ANIONGAP 9 10    Recent Labs  Lab 03/23/24 1226  PROT 6.8  ALBUMIN  3.7  AST 100*  ALT 32  ALKPHOS 64  BILITOT 1.1   Lipids  Recent Labs  Lab 03/23/24 1219  CHOL 131  TRIG 59  HDL 74  LDLCALC 45  CHOLHDL 1.8   Hematology Recent Labs  Lab 03/23/24 1045 03/23/24 1226  WBC 6.4 6.4  RBC 4.73 4.47  HGB 15.2 14.3  HCT 43.0 41.0  MCV 90.9 91.7  MCH 32.1 32.0  MCHC 35.3 34.9  RDW 12.8 12.8  PLT 262 247   Thyroid No results for input(s): TSH, FREET4 in the last 168 hours. BNPNo results for input(s): BNP, PROBNP in the last 168 hours.  DDimer No results for input(s): DDIMER in the last 168 hours.  Radiology/Studies:  CARDIAC CATHETERIZATION Result Date: 03/23/2024   Mid LM to Ost LAD lesion is 99% stenosed with 99% stenosed side branch in Ost Cx.   Prox RCA lesion is 75% stenosed.   Dist  RCA lesion is 100% stenosed.   Ost LAD to Prox LAD lesion is 100% stenosed.   Dist Graft to Insertion lesion is 100% stenosed.   LIMA graft was visualized by non-selective angiography  and is large.   SVG graft was visualized by angiography and is normal in caliber.   SVG graft was visualized by angiography.   The graft exhibits no disease.   The graft exhibits no disease.   There is mild left ventricular systolic dysfunction.   LV end diastolic pressure is normal.   The left ventricular ejection fraction is 50-55% by visual estimate. 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 The native RCA is also occluded distally Mild LV dysfunction with akinesis of the mid inferior wall Plan; patient is very late in presentation with evidence of completed infarct. He is pain free. Recommend medical management.   DG Chest 2 View Result Date: 03/23/2024 EXAM: 2 VIEW(S) XRAY OF THE CHEST 03/23/2024 11:27:41 AM COMPARISON: None available. CLINICAL HISTORY: Chest pain. FINDINGS: LUNGS AND PLEURA: No focal pulmonary opacity. No pleural effusion. No pneumothorax. Small nodules noted in left lung base. HEART AND MEDIASTINUM: CABG markers noted. No acute abnormality of the cardiac and mediastinal silhouettes. BONES AND SOFT TISSUES: Sternotomy wires noted. Large hiatal hernia. No acute osseous abnormality. IMPRESSION: 1. No acute cardiopulmonary pathology. 2. Large hiatal hernia. 3. Small nodules in the left lung base. CT chest without contrast recommended to further evaluate. Electronically signed by: Camellia Candle Jesus Jesus 03/23/2024 11:58 AM EST RP Workstation: [id number]     Assessment and Plan: Acute ST elevation myocardial infarction involving the RCA with late presentation. Now with troponin elevation 15K, Q waves. Chest pain almost completely resolved. Diagnostic cardiac cath done showing occlusion of distal RCA as well as SVG to PDA. In reviewing prior films the PDA was diffusely disease before. At this point there is no clear benefit to reperfusion and will manage medically. Grafts to the LCA look good. Mid inferior wall is akinetic. Not  a candidate for beta blocker due to brady. Will continue amlodipine . Add Imdur. Plavix  for ACS indication.  HLD. On high dose Crestor  and Zetia . Update labs. HTN controlled.  Risk Assessment/Risk Scores:   TIMI Risk Score for ST  Elevation MI:   The patient's TIMI risk score is 3, which indicates a 4.4% risk of all cause mortality at 30 days.      Code Status: Full Code  Severity of Illness: The appropriate patient status for this patient is INPATIENT. Inpatient status is judged to be reasonable and necessary in order to provide the required intensity of service to ensure the patient's safety. The patient's presenting symptoms, physical exam findings, and initial radiographic and laboratory data in the context of their chronic comorbidities is felt to place them at high risk for further clinical deterioration. Furthermore, it is not anticipated that the patient will be medically stable for discharge from the hospital within 2 midnights of admission.   * I certify that at the point of admission it is my clinical judgment that the patient will require inpatient hospital care spanning beyond 2 midnights from the point of admission due to high intensity of service, high risk for further deterioration and high frequency of surveillance required.*  For questions or updates, please contact [facility] Please consult [email or web address] for contact info under       Signed, Jesus Dirden, Jesus Jesus  03/23/2024  2:04 PM

## 2024-03-23 NOTE — ED Notes (Signed)
 Patient transported to X-ray

## 2024-03-23 NOTE — Progress Notes (Signed)
 ANTICOAGULATION CONSULT NOTE  Pharmacy Consult for Heparin  Indication: chest pain/ACS  No Known Allergies  Patient Measurements: Height: 6' (182.9 cm) Weight: 95.9 kg (211 lb 6.7 oz) IBW/kg (Calculated) : 77.6 Heparin  Dosing Weight: 95.9 kg  Vital Signs: Temp: 97.6 F (36.4 C) (12/07 1037) BP: 139/83 (12/07 1037) Pulse Rate: 58 (12/07 1037)  Labs: Recent Labs    03/23/24 1045  HGB 15.2  HCT 43.0  PLT 262    CrCl cannot be calculated (Patient's most recent lab result is older than the maximum 21 days allowed.).   Medical History: Past Medical History:  Diagnosis Date   Colon polyps    Hyperlipidemia    Hypertension     Medications:  (Not in a hospital admission)  Scheduled:  Infusions:   sodium chloride      PRN:   Assessment: 73 yom presenting with feeling off and chest pressure. Heparin  per pharmacy consult placed for chest pain/ACS.  Patient is not on anticoagulation prior to arrival.  Hgb 15.2; plt 262  Goal of Therapy:  Heparin  level 0.3-0.7 units/ml Monitor platelets by anticoagulation protocol: Yes   Plan:  Give IV heparin  4000 units bolus x 1 Start heparin  infusion at 1200 units/hr Check anti-Xa level in 8 hours and daily while on heparin  Continue to monitor H&H and platelets  Jesus Cunningham, PharmD, BCPS 03/23/2024 11:52 AM ED Clinical Pharmacist -  [phone]

## 2024-03-23 NOTE — ED Provider Notes (Signed)
 [place] EMERGENCY DEPARTMENT AT [facility] Provider Note   CSN: [id number] Arrival date & time: 03/23/24  1035     Patient presents with: Chest Pain   Jesus Cunningham is a 73 y.o. male.    Chest Pain  Patient is a 73 year old male with a history of CABG in 2022 who presents emergency room today with complaints of ongoing sternal/left-sided chest pain that is been ongoing since yesterday at noon it is been waxing and waning since that time.  He denies any cough or shortness of breath indicates no nausea or vomiting no syncope or near syncope.  He had 1 episode of chest discomfort 1 day last week but no other episodes of chest pain.      Prior to Admission medications   Medication Sig Start Date End Date Taking? Authorizing Provider  acetaminophen  (TYLENOL ) 500 MG tablet Take 2 tablets (1,000 mg total) by mouth every 6 (six) hours as needed. Fever, mild pain 04/03/21   Barrett, Erin R, PA-C  amLODipine  (NORVASC ) 10 MG tablet TAKE ONE TABLET BY MOUTH ONCE DAILY 03/20/24   Kate Lonni CROME, MD  aspirin  EC 81 MG tablet Take 81 mg by mouth daily.    [provider]  cetirizine (ZYRTEC) 10 MG tablet Take 10 mg by mouth daily as needed for allergies.    [provider]  diphenhydrAMINE (BENADRYL) 25 MG tablet Take 25 mg by mouth every 6 (six) hours as needed for allergies.    [provider]  ezetimibe  (ZETIA ) 10 MG tablet Take 1 tablet (10 mg total) by mouth daily. Please keep scheduled appointment for future refills. Thank you. 01/25/23   Kate Lonni CROME, MD  fluticasone (FLONASE) 50 MCG/ACT nasal spray Place 1 spray into both nostrils daily as needed for allergies or rhinitis.    [provider]  hydrochlorothiazide  (MICROZIDE ) 12.5 MG capsule TAKE 1 CAPSULE BY MOUTH EVERY DAY 04/24/23   Kate Lonni CROME, MD  lisinopril  (ZESTRIL ) 40 MG tablet Take 1 tablet (40 mg total) by mouth daily. Please keep scheduled appointment  for future refills. Thank  you. 01/25/23   Kate Lonni CROME, MD  meloxicam (MOBIC) 15 MG tablet TAKE (1) TABLET BY MOUTH DAILY WITH FOOD 07/22/21   [provider]  rosuvastatin  (CRESTOR ) 20 MG tablet TAKE 1 TABLET BY MOUTH EVERY DAY 01/21/24   Kate Lonni CROME, MD  VIBERZI  100 MG TABS Take 100 mg by mouth daily. 03/07/21   [provider]  zolpidem (AMBIEN) 10 MG tablet Take 10 mg by mouth at bedtime as needed. 08/29/21   [provider]    Allergies: Patient has no known allergies.    Review of Systems  Cardiovascular:  Positive for chest pain.    Updated Vital Signs BP 139/83   Pulse (!) 58   Temp 97.6 F (36.4 C)   Resp 18   Ht 6' (1.829 m)   Wt 95.9 kg   SpO2 100%   BMI 28.67 kg/m   Physical Exam Vitals and nursing note reviewed.  Constitutional:      General: He is not in acute distress. HENT:     Head: Normocephalic and atraumatic.     Nose: Nose normal.  Eyes:     General: No scleral icterus. Cardiovascular:     Rate and Rhythm: Normal rate and regular rhythm.     Pulses: Normal pulses.     Heart sounds: Normal heart sounds.     Comments: Bilateral radial artery  pulses symmetric Pulmonary:     Effort: Pulmonary effort is normal. No respiratory distress.     Breath sounds: No wheezing.  Abdominal:     Palpations: Abdomen is soft.     Tenderness: There is no abdominal tenderness.  Musculoskeletal:     Cervical back: Normal range of motion.     Right lower leg: No edema.     Left lower leg: No edema.  Skin:    General: Skin is warm and dry.     Capillary Refill: Capillary refill takes less than 2 seconds.  Neurological:     Mental Status: He is alert. Mental status is at baseline.  Psychiatric:        Mood and Affect: Mood normal.        Behavior: Behavior normal.     (all labs ordered are listed, but only abnormal results are displayed) Labs Reviewed  BASIC METABOLIC PANEL WITH GFR - Abnormal; Notable for the  following components:      Result Value   Sodium 133 (*)    Glucose, Bld 109 (*)    All other components within normal limits  TROPONIN I (HIGH SENSITIVITY) - Abnormal; Notable for the following components:   Troponin I (High Sensitivity) 7,522 (*)    All other components within normal limits  CBC  HEPARIN  LEVEL (UNFRACTIONATED)    EKG: EKG Interpretation Date/Time:  Sunday March 23 2024 11:47:01 EST Ventricular Rate:  54 PR Interval:  193 QRS Duration:  96 QT Interval:  460 QTC Calculation: 436 R Axis:   -38  Text Interpretation: Sinus rhythm Inferior infarct, recent ST elevation consider inferior injury or acute infarct Confirmed by Francesca Fallow ([id number]) on 03/23/2024 11:51:37 AM  Radiology: [facility] Chest 2 View Result Date: 03/23/2024 EXAM: 2 VIEW(S) XRAY OF THE CHEST 03/23/2024 11:27:41 AM COMPARISON: None available. CLINICAL HISTORY: Chest pain. FINDINGS: LUNGS AND PLEURA: No focal pulmonary opacity. No pleural effusion. No pneumothorax. Small nodules noted in left lung base. HEART AND MEDIASTINUM: CABG markers noted. No acute abnormality of the cardiac and mediastinal silhouettes. BONES AND SOFT TISSUES: Sternotomy wires noted. Large hiatal hernia. No acute osseous abnormality. IMPRESSION: 1. No acute cardiopulmonary pathology. 2. Large hiatal hernia. 3. Small nodules in the left lung base. CT chest without contrast recommended to further evaluate. Electronically signed by: Camellia Candle MD 03/23/2024 11:58 AM EST RP Workstation: [id number]     .Critical Care  Performed by: Neldon Hamp RAMAN, PA Authorized by: Neldon Hamp RAMAN, PA   Critical care provider statement:    Critical care time (minutes):  35   Critical care time was exclusive of:  Separately billable procedures and treating other patients and teaching time   Critical care was necessary to treat or prevent imminent or life-threatening deterioration of the following conditions: STEMI.   Critical care was time spent  personally by me on the following activities:  Development of treatment plan with patient or surrogate, review of old charts, re-evaluation of patient's condition, pulse oximetry, ordering and review of radiographic studies, ordering and review of laboratory studies, ordering and performing treatments and interventions, obtaining history from patient or surrogate, examination of patient and evaluation of patient's response to treatment   Care discussed with: admitting provider      Medications Ordered in the ED  heparin  ADULT infusion 100 units/mL (25000 units/250mL) (1,200 Units/hr Intravenous New Bag/Given 03/23/24 1159)  aspirin  chewable tablet 324 mg (324 mg Oral Given 03/23/24 1149)  heparin  bolus via infusion 4,000 Units (4,000  Units Intravenous Bolus from Bag 03/23/24 1200)    Clinical Course as of 03/23/24 1303  Sun Mar 23, 2024  1137 1 day last week of momentary CP Yesterday started having CP around noon  Has had waxing and waning CP since then.  [WF]  1141 CABG in 2022 [WF]  1147 Activate code STEMI - confirmed with secretary  [WF]  1151 Initial ECG performed showed NSVT in leads I, II, III obscuring underlying finding. avF without changes. Repeat ECG obtained showing concerning changes in III, avF, with possible reciprocal change in I and aVL. Will activate STEMI [WS]    Clinical Course User Index [WF] Neldon Hamp RAMAN, PA [WS] Francesca Elsie CROME, MD                                 Medical Decision Making Amount and/or Complexity of Data Reviewed Labs: ordered. Radiology: ordered.  Risk OTC drugs. Prescription drug management. Decision regarding hospitalization.   This patient presents to the ED for concern of CP, this involves a number of treatment options, and is a complaint that carries with it a high risk of complications and morbidity. A differential diagnosis was considered for the patient's symptoms which is discussed below:   The emergent causes of chest pain  include: Acute coronary syndrome, tamponade, pericarditis/myocarditis, aortic dissection, pulmonary embolism, tension pneumothorax, pneumonia, and esophageal rupture.    Co morbidities: Discussed in HPI   Brief History:  Patient is a 74 year old male with a history of CABG in 2022 who presents emergency room today with complaints of ongoing sternal/left-sided chest pain that is been ongoing since yesterday at noon it is been waxing and waning since that time.  He denies any cough or shortness of breath indicates no nausea or vomiting no syncope or near syncope.  He had 1 episode of chest discomfort 1 day last week but no other episodes of chest pain.     EMR reviewed including pt PMHx, past surgical history and past visits to ER.   See HPI for more details   Lab Tests:   I ordered and independently interpreted labs. Labs notable for Initial troponin elevated at 7500 Labs otherwise unremarkable  Imaging Studies:  Abnormal findings. I personally reviewed all imaging studies. Imaging notable for  Hiatal hernia present  Cardiac Monitoring:  The patient was maintained on a cardiac monitor.  I personally viewed and interpreted the cardiac monitored which showed an underlying rhythm of: NSR  initial EKG shows brief period of nonsustained V. tach with at least 4 beats   Medicines ordered:  I ordered medication including aspirin , heparin  for chest pain high suspicion for ACS Reevaluation of the patient after these medicines showed that the patient improved I have reviewed the patients home medicines and have made adjustments as needed   Critical Interventions:   STEMI activation   Consults/Attending Physician  I discussed this case with my attending physician who cosigned this note including patient's presenting symptoms, physical exam, and planned diagnostics and interventions. Attending physician stated agreement with plan or made changes to plan which were  implemented.   Attending physician assessed patient at bedside.   Discussed with Dr. Jordan of STEMI service who will take patient to Cath Lab.  He suspects STEMI yesterday with late presentation.   Reevaluation:  After the interventions noted above I re-evaluated patient and found that they have :improved   Social Determinants of Health:  Problem List / ED Course:  Patient with ST elevation inferiorly with some Q waves present concerning for old/evolving MI.  STEMI was called immediately after patient was bedded and repeat EKG was obtained.  Heparin  and aspirin  immediately initiated.  Appreciate cardiology's swift consultation.   Dispostion:  After consideration of the diagnostic results and the patients response to treatment, I feel that the patent would benefit from an action  Final diagnoses:  ST elevation myocardial infarction (STEMI), unspecified artery [facility])    ED Discharge Orders     None          Neldon Hamp RAMAN, [place] 03/23/24 1351    Francesca Elsie CROME, MD 03/23/24 [id number]

## 2024-03-23 NOTE — Discharge Instructions (Signed)

## 2024-03-24 ENCOUNTER — Other Ambulatory Visit (HOSPITAL_COMMUNITY): Payer: Self-pay

## 2024-03-24 ENCOUNTER — Inpatient Hospital Stay (HOSPITAL_COMMUNITY)

## 2024-03-24 ENCOUNTER — Telehealth (HOSPITAL_COMMUNITY): Payer: Self-pay | Admitting: Pharmacy Technician

## 2024-03-24 DIAGNOSIS — I10 Essential (primary) hypertension: Secondary | ICD-10-CM

## 2024-03-24 DIAGNOSIS — E871 Hypo-osmolality and hyponatremia: Secondary | ICD-10-CM

## 2024-03-24 LAB — MAGNESIUM: Magnesium: 1.9 mg/dL (ref 1.7–2.4)

## 2024-03-24 LAB — ECHOCARDIOGRAM COMPLETE
AR max vel: 3 cm2
AV Area VTI: 3.05 cm2
AV Area mean vel: 2.94 cm2
AV Mean grad: 4 mmHg
AV Peak grad: 7.1 mmHg
Ao pk vel: 1.33 m/s
Area-P 1/2: 4.15 cm2
Height: 72 in
S' Lateral: 3.2 cm
Weight: 3382.74 [oz_av]

## 2024-03-24 LAB — BASIC METABOLIC PANEL WITH GFR
Anion gap: 10 (ref 5–15)
BUN: 15 mg/dL (ref 8–23)
CO2: 23 mmol/L (ref 22–32)
Calcium: 8.7 mg/dL — ABNORMAL LOW (ref 8.9–10.3)
Chloride: 100 mmol/L (ref 98–111)
Creatinine, Ser: 0.75 mg/dL (ref 0.61–1.24)
GFR, Estimated: >60 mL/min (ref >60)
Glucose, Bld: 104 mg/dL — ABNORMAL HIGH (ref 70–99)
Potassium: 3.6 mmol/L (ref 3.5–5.1)
Sodium: 133 mmol/L — ABNORMAL LOW (ref 135–145)

## 2024-03-24 MED ORDER — SACUBITRIL-VALSARTAN 24-26 MG PO TABS
1.0000 | ORAL_TABLET | Freq: Two times a day (BID) | ORAL | Status: DC
  Administered 2024-03-24 (×2): 1 via ORAL
  Filled 2024-03-24 (×3): qty 1

## 2024-03-24 MED ORDER — MAGNESIUM SULFATE 2 GM/50ML IV SOLN
2.0000 g | Freq: Once | INTRAVENOUS | Status: AC
  Administered 2024-03-24: 2 g via INTRAVENOUS
  Filled 2024-03-24: qty 50

## 2024-03-24 MED ORDER — AMLODIPINE BESYLATE 10 MG PO TABS: 10.0000 mg | ORAL_TABLET | Freq: Every day | ORAL | Status: DC

## 2024-03-24 MED ORDER — TICAGRELOR 90 MG PO TABS
90.0000 mg | ORAL_TABLET | Freq: Two times a day (BID) | ORAL | Status: DC
  Administered 2024-03-24 – 2024-03-25 (×3): 90 mg via ORAL
  Filled 2024-03-24 (×3): qty 1

## 2024-03-24 MED ORDER — EMPAGLIFLOZIN 10 MG PO TABS
10.0000 mg | ORAL_TABLET | Freq: Every day | ORAL | Status: DC
  Administered 2024-03-24 – 2024-03-25 (×2): 10 mg via ORAL
  Filled 2024-03-24 (×2): qty 1

## 2024-03-24 MED ORDER — PERFLUTREN LIPID MICROSPHERE
1.0000 mL | INTRAVENOUS | Status: AC | PRN
  Administered 2024-03-24: 3 mL via INTRAVENOUS

## 2024-03-24 MED ORDER — LISINOPRIL 20 MG PO TABS: 40.0000 mg | ORAL_TABLET | Freq: Every day | ORAL | Status: DC

## 2024-03-24 MED ORDER — CHLORHEXIDINE GLUCONATE CLOTH 2 % EX PADS
6.0000 | MEDICATED_PAD | Freq: Every day | CUTANEOUS | Status: DC
  Administered 2024-03-24 – 2024-03-25 (×2): 6 via TOPICAL

## 2024-03-24 NOTE — Progress Notes (Signed)
 Patient Name: Jesus Cunningham Date of Encounter: 03/24/2024 [facility] Cardiologist: Lonni LITTIE Nanas, MD   Interval Summary  .    No chest pain  Vital Signs .    Vitals:   03/24/24 0300 03/24/24 0400 03/24/24 0500 03/24/24 0800  BP: 110/73 119/76  132/81  Pulse:  (!) 55    Resp: (!) 21 20  12   Temp:   98.5 F (36.9 C)   TempSrc:   Oral   SpO2:  94%    Weight:      Height:        Intake/Output Summary (Last 24 hours) at 03/24/2024 0855 Last data filed at 03/23/2024 1700 Gross per 24 hour  Intake --  Output 300 ml  Net -300 ml      03/23/2024   10:43 AM 10/23/2023    3:22 PM 02/12/2023   10:10 AM  Last 3 Weights  Weight (lbs) 211 lb 6.7 oz 211 lb 6.4 oz 209 lb 6.4 oz  Weight (kg) 95.9 kg 95.89 kg 94.983 kg      Telemetry/ECG    03/24/2024- Personally Reviewed No arrhythmia  Echocardiogram 03/24/2024: Formal read pending, EF 45 to 50% with inferior akinesis per my read  Coronary intervention 03/23/2024:    Mid LM to Ost LAD lesion is 99% stenosed with 99% stenosed side branch in Ost Cx.   Prox RCA lesion is 75% stenosed.   Dist RCA lesion is 100% stenosed.   Ost LAD to Prox LAD lesion is 100% stenosed.   Dist Graft to Insertion lesion is 100% stenosed.   LIMA graft was visualized by non-selective angiography and is large.   SVG graft was visualized by angiography and is normal in caliber.   SVG graft was visualized by angiography.   The graft exhibits no disease.   The graft exhibits no disease.   There is mild left ventricular systolic dysfunction.   LV end diastolic pressure is normal.   The left ventricular ejection fraction is 50-55% by visual estimate.   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 The native RCA is also occluded distally Mild LV dysfunction with akinesis of the mid inferior wall   Plan; patient is very late in presentation with evidence of  completed infarct. He is pain free. Recommend medical management.   Physical Exam .   Physical Exam Vitals and nursing note reviewed.  Constitutional:      General: He is not in acute distress. Neck:     Vascular: No JVD.  Cardiovascular:     Rate and Rhythm: Normal rate and regular rhythm.     Heart sounds: Normal heart sounds. No murmur heard. Pulmonary:     Effort: Pulmonary effort is normal.     Breath sounds: Normal breath sounds. No wheezing or rales.  Musculoskeletal:     Right lower leg: No edema.     Left lower leg: No edema.      Assessment & Plan .     73 year old male with hypertension, hyperlipidemia, CAD s/p CABG x 3 (LIMA-LAD, SVG-OM1, SVG-PDA), admitted with inferior STEMI.  Cardiac authorization showed occluded SVG-PDA, late presentation  STEMI: Culprit occluded SVG-PDA. Troponin HS 24,000> Late presentation, revascularization not indicated. Recommend DAPT with aspirin  and Brilinta  for 1 year, followed by Plavix  monotherapy. Lipids very well-controlled on Crestor  20 mg daily, continue the same. EF 45-50% with inferior akinesis. Stop lisinopril .  Add Jardiance  10 mg daily today Start Entresto  24-26 mg twice daily this morning Will monitor how he tolerates addition of above GDMT and watch 1 more day in the hospital. Transfer to unit today, discharge tomorrow  Hypertension: Blood pressure low normal.  Discontinue amlodipine  and hydrochlorothiazide .  Last dose lisinopril  on 03/22/2024 evening. Will start Entresto  24-26 mg twice daily this morning.  Hyponatremia: Likely secondary to hydrochlorothiazide , discontinued now.  Anticipated discharge date: 03/25/2024  For questions or updates, please contact [facility] Please consult [email or web address] for contact info under        Signed, Newman JINNY Lawrence, MD

## 2024-03-24 NOTE — TOC CM/SW Note (Signed)
 Transition of Care [facility]) - Inpatient Brief Assessment   Patient Details  Name: Jesus Cunningham MRN: [id number] Date of Birth: 21-Jan-1951  [facility]) CM/SW Contact:    Waddell Barnie Rama, RN Phone Number: 03/24/2024, 3:01 PM   Clinical Narrative: From home with spouse, has PCP and insurance on file, states has no HH services in place at this time or DME at home.  States family member (wife)  will transport them home at [employer] and family is support system, states gets medications from [facility] in [place].  Pta self ambulatory.   There are no ICM needs identified  at this time.  Please place consult for ICM needs.     Transition of Care Asessment: Insurance and Status: Insurance coverage has been reviewed Patient has primary care physician: Yes Home environment has been reviewed: home with wife Prior level of function:: indep Prior/Current Home Services: No current home services Social Drivers of Health Review: SDOH reviewed no interventions necessary Readmission risk has been reviewed: Yes Transition of care needs: no transition of care needs at this time

## 2024-03-24 NOTE — Plan of Care (Signed)

## 2024-03-24 NOTE — Telephone Encounter (Signed)
 Pt seen in ER and admitted

## 2024-03-24 NOTE — Telephone Encounter (Addendum)
 Patient Product/process Development Scientist completed.    The patient is insured through [employer]. Patient has [employer], may use a copay card, and/or apply for patient assistance if available.    Ran test claim for ticagrelor  (Brilinta ) 90 mg and the current 30 day co-pay is $25.00.  Ran test claim for sacubitril -valsartan  (Entresto ) 90 mg and the current 30 day co-pay is $25.00.  Ran test claim for Farxiga 10 mg and the current 30 day co-pay is $40.00.  Ran test claim for Jardiance  10 mg and the current 30 day co-pay is $25.00.   This test claim was processed through [facility]- copay amounts may vary at other pharmacies due to pharmacy/plan contracts, or as the patient moves through the different stages of their insurance plan.     Reyes Sharps, CPHT Pharmacy Technician Patient Advocate Specialist Lead [facility] Patient Advocate Team Direct Number: [phone]  Fax: [phone]

## 2024-03-24 NOTE — Progress Notes (Signed)
 CARDIAC REHAB PHASE I     Post MI education including restrictions, risk factors, exercise guidelines, MI booklet, NTG use, heart healthy diet and CRP2 reviewed. All questions and concerns addressed. Will refer to [facility] for CRP2.   [id number] Vaughn Asberry Hacking, RN BSN 03/24/2024 10:26 AM

## 2024-03-25 ENCOUNTER — Telehealth: Payer: Self-pay | Admitting: Cardiology

## 2024-03-25 ENCOUNTER — Other Ambulatory Visit (HOSPITAL_COMMUNITY): Payer: Self-pay

## 2024-03-25 DIAGNOSIS — E782 Mixed hyperlipidemia: Secondary | ICD-10-CM

## 2024-03-25 LAB — LIPOPROTEIN A (LPA): Lipoprotein (a): 22.8 nmol/L (ref <75.0)

## 2024-03-25 MED ORDER — LISINOPRIL 40 MG PO TABS: 20.0000 mg | ORAL_TABLET | Freq: Every day | ORAL | Status: AC

## 2024-03-25 MED ORDER — EZETIMIBE 10 MG PO TABS
10.0000 mg | ORAL_TABLET | Freq: Every day | ORAL | 0 refills | Status: AC
  Filled 2024-03-25: qty 30, 30d supply, fill #0

## 2024-03-25 MED ORDER — TICAGRELOR 90 MG PO TABS
90.0000 mg | ORAL_TABLET | Freq: Two times a day (BID) | ORAL | 11 refills | Status: AC
  Filled 2024-03-25: qty 60, 30d supply, fill #0

## 2024-03-25 MED ORDER — EMPAGLIFLOZIN 10 MG PO TABS
10.0000 mg | ORAL_TABLET | Freq: Every day | ORAL | 2 refills | Status: AC
  Filled 2024-03-25: qty 90, 90d supply, fill #0

## 2024-03-25 MED ORDER — NITROGLYCERIN 0.4 MG SL SUBL
0.4000 mg | SUBLINGUAL_TABLET | SUBLINGUAL | 3 refills | Status: AC | PRN
  Filled 2024-03-25: qty 25, 8d supply, fill #0

## 2024-03-25 NOTE — Plan of Care (Signed)
  Problem: Education: Goal: Knowledge of General Education information will improve Description: Including pain rating scale, medication(s)/side effects and non-pharmacologic comfort measures Outcome: Adequate for Discharge   Problem: Health Behavior/Discharge Planning: Goal: Ability to manage health-related needs will improve Outcome: Adequate for Discharge   Problem: Clinical Measurements: Goal: Ability to maintain clinical measurements within normal limits will improve Outcome: Adequate for Discharge Goal: Will remain free from infection Outcome: Adequate for Discharge Goal: Diagnostic test results will improve Outcome: Adequate for Discharge Goal: Respiratory complications will improve Outcome: Adequate for Discharge Goal: Cardiovascular complication will be avoided Outcome: Adequate for Discharge   Problem: Activity: Goal: Risk for activity intolerance will decrease Outcome: Adequate for Discharge   Problem: Nutrition: Goal: Adequate nutrition will be maintained Outcome: Adequate for Discharge   Problem: Coping: Goal: Level of anxiety will decrease Outcome: Adequate for Discharge   Problem: Elimination: Goal: Will not experience complications related to bowel motility Outcome: Adequate for Discharge Goal: Will not experience complications related to urinary retention Outcome: Adequate for Discharge   Problem: Pain Managment: Goal: General experience of comfort will improve and/or be controlled Outcome: Adequate for Discharge   Problem: Safety: Goal: Ability to remain free from injury will improve Outcome: Adequate for Discharge   Problem: Skin Integrity: Goal: Risk for impaired skin integrity will decrease Outcome: Adequate for Discharge   Problem: Education: Goal: Understanding of cardiac disease, CV risk reduction, and recovery process will improve Outcome: Adequate for Discharge Goal: Individualized Educational Video(s) Outcome: Adequate for Discharge    Problem: Activity: Goal: Ability to tolerate increased activity will improve Outcome: Adequate for Discharge   Problem: Cardiac: Goal: Ability to achieve and maintain adequate cardiovascular perfusion will improve Outcome: Adequate for Discharge   Problem: Health Behavior/Discharge Planning: Goal: Ability to safely manage health-related needs after discharge will improve Outcome: Adequate for Discharge   Problem: Education: Goal: Understanding of CV disease, CV risk reduction, and recovery process will improve Outcome: Adequate for Discharge Goal: Individualized Educational Video(s) Outcome: Adequate for Discharge   Problem: Activity: Goal: Ability to return to baseline activity level will improve Outcome: Adequate for Discharge   Problem: Cardiovascular: Goal: Ability to achieve and maintain adequate cardiovascular perfusion will improve Outcome: Adequate for Discharge Goal: Vascular access site(s) Level 0-1 will be maintained Outcome: Adequate for Discharge   Problem: Health Behavior/Discharge Planning: Goal: Ability to safely manage health-related needs after discharge will improve Outcome: Adequate for Discharge

## 2024-03-25 NOTE — Discharge Summary (Signed)
 Discharge Summary   Patient ID: Jesus Cunningham MRN: [id number]; DOB: 16-Oct-1950  Admit date: 03/23/2024 Discharge date: 03/25/2024  PCP:  Myra Geni ORN, FNP   [facility] Providers Cardiologist:  Lonni LITTIE Nanas, MD       Discharge Diagnoses  Principal Problem:   STEMI involving right coronary artery [facility])  Other diagnoses: Hypertension Mixed hyperlipidemia  Diagnostic Studies/Procedures  Echocardiogram 03/24/2024:  1. Left ventricular ejection fraction, by estimation, is 50 to 55%. The  left ventricle has low normal function. The left ventricle demonstrates  regional wall motion abnormalities (see scoring diagram/findings for  description). Left ventricular diastolic   parameters were normal. There is severe akinesis of the left ventricular,  mid-apical anteroseptal wall, inferoseptal wall and inferior wall. The  average left ventricular global longitudinal strain is -11.9 %. The global  longitudinal strain is abnormal.   2. Right ventricular systolic function is low normal. The right  ventricular size is normal. Tricuspid regurgitation signal is inadequate  for assessing PA pressure.   3. The mitral valve is grossly normal. Trivial mitral valve  regurgitation.   4. The aortic valve is tricuspid. Aortic valve regurgitation is not  visualized. No aortic stenosis is present.   5. The inferior vena cava is normal in size with greater than 50%  respiratory variability, suggesting right atrial pressure of 3 mmHg.   Comparison(s): Changes from prior study are noted. 12/19/2023: LVDF 55-60%.   Coronary and bypass graft angiography 03/23/2024:   Mid LM to Ost LAD lesion is 99% stenosed with 99% stenosed side branch in Ost Cx.   Prox RCA lesion is 75% stenosed.   Dist RCA lesion is 100% stenosed.   Ost LAD to Prox LAD lesion is 100% stenosed.   Dist Graft to Insertion lesion is 100% stenosed.   LIMA graft was visualized by non-selective angiography and is  large.   SVG graft was visualized by angiography and is normal in caliber.   SVG graft was visualized by angiography.   The graft exhibits no disease.   The graft exhibits no disease.   There is mild left ventricular systolic dysfunction.   LV end diastolic pressure is normal.   The left ventricular ejection fraction is 50-55% by visual estimate.   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 The native RCA is also occluded distally Mild LV dysfunction with akinesis of the mid inferior wall   Plan; patient is very late in presentation with evidence of completed infarct. He is pain free. Recommend medical management.    _____________   History of Present Illness   73 year old male with hypertension, hyperlipidemia, CAD s/p CABG x 3 (LIMA-LAD, SVG-OM1, SVG-PDA), admitted with inferior STEMI.  Cardiac authorization showed occluded SVG-PDA, late presentation   STEMI: Culprit occluded SVG-PDA. Troponin HS 24,000> Late presentation, revascularization not indicated. Recommend DAPT with aspirin  and Brilinta  for 1 year, followed by Plavix  monotherapy. Lipids very well-controlled on Crestor  20 mg daily, continue the same. EF 50-55% with inferior akinesis. Reasonable to continue lisinopril , instead of Entresto  as we had previously discussed during this hospitalization.  He can resume lisinopril  tomorrow night, at lower dose of 20 mg daily, given low-normal blood pressures. Continue Jardiance  10 mg daily today   Hypertension: Blood pressure low normal.  Discontinued amlodipine  and hydrochlorothiazide .  Okay to resume lisinopril  20 mg daily tomorrow night.    Hyponatremia: Likely secondary to hydrochlorothiazide , discontinued  now.    Physical Exam Vitals and nursing note reviewed.  Constitutional:      General: He is not in acute distress. Neck:     Vascular: No JVD.  Cardiovascular:     Rate and Rhythm: Normal rate  and regular rhythm.     Heart sounds: Normal heart sounds. No murmur heard. Pulmonary:     Effort: Pulmonary effort is normal.     Breath sounds: Normal breath sounds. No wheezing or rales.  Musculoskeletal:     Right lower leg: No edema.     Left lower leg: No edema.      Hospital Course   Consultants: Cardiac rehab      Did the patient have an acute coronary syndrome (MI, NSTEMI, STEMI, etc) this admission?:  Yes                               AHA/ACC ACS Clinical Performance & Quality Measures: Aspirin  prescribed? - Yes ADP Receptor Inhibitor (Plavix /Clopidogrel , Brilinta /Ticagrelor  or Effient/Prasugrel) prescribed (includes medically managed patients)? - Yes Beta Blocker prescribed? - No. The patient has an EF >/= 50%. Based upon the [facility] Study pub in Apr 2024, there is no benefit in patients with preserved EF post MI. High Intensity Statin (Lipitor  40-80mg  or Crestor  20-40mg ) prescribed? - Yes EF assessed during THIS hospitalization? - Yes For EF <40%, was ACEI/ARB prescribed? - Not Applicable (EF >/= 40%) For EF <40%, Aldosterone Antagonist (Spironolactone or Eplerenone) prescribed? - Not Applicable (EF >/= 40%) Cardiac Rehab Phase II ordered (including medically managed patients)? - Yes      The patient will be scheduled for a TOC follow up appointment in 14 days.  A message has been sent to the [facility] and Scheduling Pool at the office where the patient should be seen for follow up.  _____________  Discharge Vitals Blood pressure 132/87, pulse 65, temperature 98 F (36.7 C), temperature source Oral, resp. rate 18, height 6' (1.829 m), weight 95.9 kg, SpO2 95%.  Filed Weights   03/23/24 1043  Weight: 95.9 kg    Labs & Radiologic Studies  CBC Recent Labs    03/23/24 1045 03/23/24 1226  WBC 6.4 6.4  NEUTROABS  --  4.6  HGB 15.2 14.3  HCT 43.0 41.0  MCV 90.9 91.7  PLT 262 247   Basic Metabolic Panel Recent Labs    87/92/74 1226 03/24/24 0210  NA  133* 133*  K 4.1 3.6  CL 102 100  CO2 21* 23  GLUCOSE 107* 104*  BUN 13 15  CREATININE 0.81 0.75  CALCIUM  8.7* 8.7*  MG  --  1.9   Liver Function Tests Recent Labs    03/23/24 1226  AST 100*  ALT 32  ALKPHOS 64  BILITOT 1.1  PROT 6.8  ALBUMIN  3.7   No results for input(s): LIPASE, AMYLASE in the last 72 hours. High Sensitivity Troponin:   Recent Labs  Lab 03/23/24 1045 03/23/24 1226 03/23/24 1622  TROPONINIHS 7,522* 15,472* >24,000*     Hemoglobin A1C Recent Labs    03/23/24 1219  HGBA1C 5.3   Fasting Lipid Panel Recent Labs    03/23/24 1219  CHOL 131  HDL 74  LDLCALC 45  TRIG 59  CHOLHDL 1.8   Disposition Pt is being discharged home today in good condition.  Follow-up Plans & Appointments  Follow-up Information     Myra Geni ORN, FNP Follow up.   Specialty:  Family Medicine Contact information: [address] [place] [address] [phone]                Discharge Instructions     Amb Referral to Cardiac Rehabilitation   Complete by: As directed    [place]   Diagnosis: STEMI   After initial evaluation and assessments completed: Virtual Based Care may be provided alone or in conjunction with Phase 2 Cardiac Rehab based on patient barriers.: Yes   Intensive Cardiac Rehabilitation (ICR) [facility] location only OR Traditional Cardiac Rehabilitation (TCR) *If criteria for ICR are not met will enroll in TCR [facility] only): Yes       Discharge Medications Allergies as of 03/25/2024   No Known Allergies      Medication List     STOP taking these medications    acetaminophen  500 MG tablet Commonly known as: TYLENOL    amLODipine  10 MG tablet Commonly known as: NORVASC    diphenhydrAMINE 25 MG tablet Commonly known as: BENADRYL   fluticasone 50 MCG/ACT nasal spray Commonly known as: FLONASE   hydrochlorothiazide  12.5 MG capsule Commonly known as: MICROZIDE    ibuprofen 600 MG tablet Commonly known as: ADVIL   meloxicam 15 MG  tablet Commonly known as: MOBIC       TAKE these medications    aspirin  EC 81 MG tablet Take 81 mg by mouth daily.   cetirizine 10 MG tablet Commonly known as: ZYRTEC Take 10 mg by mouth daily as needed for allergies.   ezetimibe  10 MG tablet Commonly known as: ZETIA  Take 1 tablet (10 mg total) by mouth daily. Please keep scheduled appointment for future refills. Thank you.   Jardiance  10 MG Tabs tablet Generic drug: empagliflozin  Take 1 tablet (10 mg total) by mouth daily.   lisinopril  40 MG tablet Commonly known as: ZESTRIL  Take 0.5 tablets (20 mg total) by mouth daily. Please keep scheduled appointment for future refills. Thank  you. Start taking on: March 26, 2024 What changed: how much to take   nitroGLYCERIN  0.4 MG SL tablet Commonly known as: NITROSTAT  Place 1 tablet (0.4 mg total) under the tongue every 5 (five) minutes x 3 doses as needed for chest pain.   rosuvastatin  20 MG tablet Commonly known as: CRESTOR  TAKE 1 TABLET BY MOUTH EVERY DAY   ticagrelor  90 MG Tabs tablet Commonly known as: BRILINTA  Take 1 tablet (90 mg total) by mouth 2 (two) times daily.   Viberzi  100 MG Tabs Generic drug: Eluxadoline  Take 100 mg by mouth daily.   zolpidem 10 MG tablet Commonly known as: AMBIEN Take 10 mg by mouth at bedtime as needed.         Outstanding Labs/Studies Recommend checking BMP to evaluate creatinine and sodium outpatient  Duration of Discharge Encounter: MD Time: 30 minutes   Signed, Newman JINNY Lawrence, MD 03/25/2024, 9:33 AM

## 2024-03-25 NOTE — Telephone Encounter (Signed)
   Transition of Care Follow-up Phone Call Request    Patient Name: Jesus Cunningham Date of Birth: [birth year] Date of Encounter: 03/25/2024  Primary Care Provider:  Myra Geni ORN, FNP Primary Cardiologist:  Lonni LITTIE Nanas, MD  Jesus Cunningham has been scheduled for a transition of care follow up appointment with a [facility] provider:  Rollo Louder 12/30  Please reach out to Jesus Cunningham within 48 hours of discharge to confirm appointment and review transition of care protocol questionnaire. Anticipated discharge date: 12/9  Manuelita Rummer, NP  03/25/2024, 10:13 AM

## 2024-03-25 NOTE — TOC Transition Note (Signed)
 Transition of Care [facility]) - Discharge Note   Patient Details  Name: Jesus Cunningham MRN: [id number] Date of Birth: 11/21/1950  Transition of [facility]) CM/SW Contact:  Waddell Barnie Rama, RN Phone Number: 03/25/2024, 10:01 AM   Clinical Narrative:    For possible dc today, has no needs.         Patient Goals and CMS Choice            Discharge Placement                       Discharge Plan and Services Additional resources added to the After Visit Summary for                                       Social Drivers of Health (SDOH) Interventions SDOH Screenings   Food Insecurity: No Food Insecurity (03/23/2024)  Housing: Unknown (03/23/2024)  Transportation Needs: No Transportation Needs (03/23/2024)  Utilities: Not At Risk (03/23/2024)  Social Connections: Socially Integrated (03/23/2024)  Tobacco Use: Medium Risk (03/23/2024)     Readmission Risk Interventions    03/24/2024    3:00 PM  Readmission Risk Prevention Plan  Medication Screening Complete  Transportation Screening Complete

## 2024-03-25 NOTE — Progress Notes (Signed)
 Explained discharge instructions to patient. Reviewed follow up appointment and next medication administration times. Also reviewed education. Patient verbalized having an understanding for instructions given. All belongings are in the patient's possession. Will pick up TOC meds on the way out for discharge. IV and telemetry were removed by floor staff prior to explaining the discharge. No other needs verbalized. Awaiting Volunteer services to transport downstairs for discharge.

## 2024-04-01 NOTE — Progress Notes (Deleted)
 Cardiology Office Note   Date:  04/01/2024  ID:  Jesus Cunningham, DOB 18-Jun-1950, MRN [id number] PCP: Myra Geni ORN, FNP  [facility] Providers Cardiologist:  Lonni LITTIE Nanas, MD  History of Present Illness Jesus Cunningham is a 73 y.o. male with a past medical history of CAD s/p CABG (LIMA-LAD, SVG-PDA, SVG-OM2), recent STEMI, HTN, HLD. Presents today for a hospital follow up after recent STEMI   Patient previously admitted in 03/2021 with an NSTEMI. Echocardiogram at that time showed EF 60-65%. Cardiac catheterization showed severe multivessel CAD including severe heavily calcified distal left main stenosis. Underwent CABG with LIMA-LAD, SVG-PDA, and SVG-OM2.   Cardiac monitor in 07/2021 showed one 4-beat run of NSVT and 7 brief episodes of SVT.   Cardiac monitor in 10/2023 showed an 8 beat episodes of NSVT, occasional PVCs (3.5%) and PACs (1.8%). Echo in 12/2023 showed EF 55-60% with wall motion abnormalities, mild LVH, mildly reduced RV systolic function, mild dilatation of the ascending aorta   Patient presented on 03/23/24. Reported that the day prior, he had been loading feed into his truck when he developed chest pressure. His BP was elevated so he took an extra dose of his lisinopril . Pain eased off, but returned that evening. Persisted to the morning of 12/7 when he decided to seek treatment. EKG showed mild ST elevation inferiorly with new Q waves. Underwent emergent cath that showed left main and severe 3 vessel CAD, patent LIMA-LAD, SVG-first OM. The SVG-PDA was occluded distally. As patient had a very late presentation, recommended medical management. Echocardiogram 03/24/24 showed EF 50-55% with regional wall motion abnormalities, low normal RV systolic function.   Patient was discharged on ASA and brilinta  for 1 year followed by plavix  monotherapy. Also on crestor  20 mg daily, lisinopril  20 mg daily, jardiance  10 mg daily, zetia  10 mg daily.   Recent STEMI  CAD   Ischemic Cardiomyopathy  - Previously underwent CABG with LIMA-LAD, SVG-PDA, and SVG-OM in 2022 - Recently admitted 12/7-12/9 with a late-presenting STEMI. Cath showed left main and severe 3 vessel CAD, patent LIMA-LAD, SVG-first OM. The SVG-PDA was occluded distally. Due to late presentation, patient was managed medically  - Echocardiogram showed EF 50-55% with wall motion abnormalities, low normal RV systolic function  - Continue DAPT with ASA and Brilinta  for 1 year followed by plavix  monotherapy  - Continue crestor  20 mg daily and zetia  10 mg daily  - Continue lisinopril  20 mg daily and jardiance  10 mg daily   HTN  -  - Continue lisinopril  20 mg daily   HLD  - Lipid panel from 03/2024 showed LDL 45, HDL 74, triglycerides 59, total cholesterol 131  - Continue crestor  20 mg daily and zetia  10 mg daily   PVCs/PACs  - Cardiac monitor 10/2023 showed 3.5% PVC burden and 1.8% PAC burden  -   Hyponatremia  - Na 133 during recent admission. Thought to be secondary to hydrochlorothiazide , which was discontinued  - Ordered BMP today   ROS: ***  Studies Reviewed      *** Risk Assessment/Calculations {Does this patient have ATRIAL FIBRILLATION?:[phone]} No BP recorded.  {Refresh Note OR Click here to enter BP  :1}***       Physical Exam VS:  There were no vitals taken for this visit.       Wt Readings from Last 3 Encounters:  03/23/24 211 lb 6.7 oz (95.9 kg)  10/23/23 211 lb 6.4 oz (95.9 kg)  02/12/23 209 lb 6.4 oz (95  kg)    GEN: Well nourished, well developed in no acute distress NECK: No JVD; No carotid bruits CARDIAC: ***RRR, no murmurs, rubs, gallops RESPIRATORY:  Clear to auscultation without rales, wheezing or rhonchi  ABDOMEN: Soft, non-tender, non-distended EXTREMITIES:  No edema; No deformity   ASSESSMENT AND PLAN ***    {Are you ordering a CV Procedure (e.g. stress test, cath, DCCV, TEE, etc)?   Press F2        :[id number]}  Dispo: ***  Signed, Rollo FABIENE Louder, PA-C

## 2024-04-14 NOTE — Progress Notes (Unsigned)
 " Cardiology Office Note   Date: 04/15/2024  ID:  Jesus Cunningham [birth year] [id number] PCP: Myra Geni ORN, FNP  [facility] Providers Cardiologist: Lonni LITTIE Nanas, MD     Chief Complaint: Jesus Cunningham is a 73 y.o.male with PMH of CAD s/p CABGx3 (LIMA-LAD, SVG-PDA, SVG-OM2) 03/30/2021 with subsequent STEMI 03/23/2024, postoperative PAF, hypertension, hyperlipidemia who presents to the clinic for posthospital follow-up.    Jesus Cunningham establish cardiology care in 04/2021 after NSTEMI with LHC showing severe MVD and heavily calcified left main coronary artery.  Echo 03/28/2021 showed LVEF 60-65%, G1DD.  Ultimately underwent CABGx3 (LIMA-LAD, SVG-PDA, SVG-OM 2) on 03/30/2021. Intraoperative TEE showed LVEF 55 to 60%.  PAF postoperatively requiring amiodarone  and metoprolol  for rhythm and rate control. Ultimately discharged 04/03/2021 on aspirin  and Plavix .    Developed bradycardia after discharge and metoprolol  was stopped 04/2021.  Amiodarone  discontinued 06/2021.  Struggled with hypertension for which pharmacy team assisted in management. Had syncopal episode reported 07/2021, cardiac monitor showed one 4-beat run of NSVT and 7 brief episodes of SVT.  No further syncopal episodes.  Plavix  discontinued 03/2022.  Seen in clinic 10/2023, PVCs noted on EKG.  3-day monitor showed predominantly sinus rhythm with one 8-beat run of NSVT, 3.5% PVCs, 1.8% PACs.  Echo 12/19/2023 showed LVEF 55 to 60% with WMAs, mild LVH, RV function mildly reduced, ascending aorta 44 mm. Recommended to follow this annually.  Unfortunately presented to the ER 03/23/2024 with chest pressure that began the day prior to presentation that did persist.  EKG showed mild ST inferiorly and new Q waves. Cardiac catheterization showed patent LIMA-LAD and SVG-OM1.  SVG-PDA distally occluded.  Given the delayed timing of his presentation to the hospital, medical management was recommended with aspirin  and Brilinta  for 1 year  followed by Plavix  monotherapy.  Echo 03/24/2024 showed LVEF 50-55%, RWMAs, low normal RV function.  He was discharged 03/25/2024 on rosuvastatin , Zetia , aspirin , Brilinta , lisinopril , and Jardiance .     History of Present Illness: Today he is doing okay. Feels anxious around wondering if he is going to have a recurrent MI, though notes he feels much better than when he first presented to the ER. Has noticed a slight headache a couple of hours after taking Brilinta  that self-resolves. Is currently struggling with an URI and feels this may also be related. Had one episode of lightheadedness last night, denies syncope. BP was 118/71 which is normal for him. 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 Denies chest pain, DOE, palpitations, abnormal bleeding. Is interested in participating in cardiac rehab in [place].  ROS: Please see the history of present illness. All other systems reviewed and are negative.    Studies Reviewed: The following studies were personally reviewed today: EKG Interpretation Date/Time:  Tuesday April 15 2024 08:14:34 EST Ventricular Rate:  69 PR Interval:  166 QRS Duration:  86 QT Interval:  430 QTC Calculation: 460 R Axis:   35  Text Interpretation: Normal sinus rhythm Inferior infarct (cited on or before 24-Mar-2024) Confirmed by Nola Numbers ([id number]) on 04/15/2024 8:50:57 AM    Cardiac Studies & Procedures   ______________________________________________________________________________________________ CARDIAC CATHETERIZATION  CARDIAC CATHETERIZATION 03/23/2024  Conclusion   Mid LM to Ost LAD lesion is 99% stenosed with 99% stenosed side branch in Ost Cx.   Prox RCA lesion is 75% stenosed.   Dist RCA lesion is 100% stenosed.   Ost LAD to Prox LAD lesion is 100% stenosed.   Dist Graft to Insertion lesion is 100%  stenosed.   LIMA graft was visualized by non-selective angiography and is large.   SVG graft was visualized by angiography and is normal in  caliber.   SVG graft was visualized by angiography.   The graft exhibits no disease.   The graft exhibits no disease.   There is mild left ventricular systolic dysfunction.   LV end diastolic pressure is normal.   The left ventricular ejection fraction is 50-55% by visual estimate.  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 The native RCA is also occluded distally Mild LV dysfunction with akinesis of the mid inferior wall  Plan; patient is very late in presentation with evidence of completed infarct. He is pain free. Recommend medical management.  Findings Coronary Findings Diagnostic  Dominance: Right  Left Main Mid LM to Ost LAD lesion is 99% stenosed with 99% stenosed side branch in Ost Cx. The lesion is calcified.  Left Anterior Descending Vessel is large. The vessel is severely calcified. Ost LAD to Prox LAD lesion is 100% stenosed.  Left Circumflex Vessel is large.  Second Obtuse Marginal Branch Vessel is small in size.  Third Obtuse Marginal Branch Vessel is small in size.  Right Coronary Artery The vessel is severely calcified. Prox RCA lesion is 75% stenosed. The lesion is calcified. Dist RCA lesion is 100% stenosed. The lesion is calcified.  LIMA LIMA Graft To Mid LAD LIMA graft was visualized by non-selective angiography and is large.  The graft exhibits no disease.  Saphenous Graft To 1st Mrg SVG graft was visualized by angiography and is normal in caliber.  The graft exhibits no disease.  Saphenous Graft To RPDA SVG graft was visualized by angiography. Dist Graft to Insertion lesion is 100% stenosed.  Intervention  No interventions have been documented.   ECHOCARDIOGRAM  ECHOCARDIOGRAM COMPLETE 03/24/2024  Narrative ECHOCARDIOGRAM REPORT    Patient Name:   Jesus Cunningham Date of Exam: 03/24/2024 Medical Rec #:  [id number]        Height:       72.0 in Accession #:     [id number]       Weight:       211.4 lb Date of Birth:  Oct 26, 1950       BSA:          2.181 m Patient Age:    73 years         BP:           132/81 mmHg Patient Gender: M                HR:           66 bpm. Exam Location:  Inpatient  Procedure: 2D Echo, 3D Echo, Cardiac Doppler, Color Doppler, Strain Analysis and Intracardiac Opacification Agent (Both Spectral and Color Flow Doppler were utilized during procedure).  Indications:    Acute ischemic heart disease  History:        Patient has prior history of Echocardiogram examinations, most recent 12/19/2023. Prior CABG; Risk Factors:Hypertension and Dyslipidemia.  Sonographer:    Philomena Daring Referring Phys: MAUDE HERO JORDAN   Sonographer Comments: Global longitudinal strain was attempted. IMPRESSIONS   1. Left ventricular ejection fraction, by estimation, is 50 to 55%. The left ventricle has low normal function. The left ventricle demonstrates regional wall motion abnormalities (see scoring diagram/findings for description). Left ventricular diastolic parameters were normal. There is severe akinesis of  the left ventricular, mid-apical anteroseptal wall, inferoseptal wall and inferior wall. The average left ventricular global longitudinal strain is -11.9 %. The global longitudinal strain is abnormal. 2. Right ventricular systolic function is low normal. The right ventricular size is normal. Tricuspid regurgitation signal is inadequate for assessing PA pressure. 3. The mitral valve is grossly normal. Trivial mitral valve regurgitation. 4. The aortic valve is tricuspid. Aortic valve regurgitation is not visualized. No aortic stenosis is present. 5. The inferior vena cava is normal in size with greater than 50% respiratory variability, suggesting right atrial pressure of 3 mmHg.  Comparison(s): Changes from prior study are noted. 12/19/2023: LVDF 55-60%.  FINDINGS Left Ventricle: Left ventricular ejection fraction, by estimation, is 50 to  55%. The left ventricle has low normal function. The left ventricle demonstrates regional wall motion abnormalities. Severe akinesis of the left ventricular, mid-apical anteroseptal wall, inferoseptal wall and inferior wall. Definity  contrast agent was given IV to delineate the left ventricular endocardial borders. The average left ventricular global longitudinal strain is -11.9 %. Strain was performed and the global longitudinal strain is abnormal. The left ventricular internal cavity size was normal in size. There is no left ventricular hypertrophy. Left ventricular diastolic parameters were normal.   LV Wall Scoring: The inferior septum and entire inferior wall are akinetic.  Right Ventricle: The right ventricular size is normal. No increase in right ventricular wall thickness. Right ventricular systolic function is low normal. Tricuspid regurgitation signal is inadequate for assessing PA pressure.  Left Atrium: Left atrial size was normal in size.  Right Atrium: Right atrial size was normal in size.  Pericardium: There is no evidence of pericardial effusion.  Mitral Valve: The mitral valve is grossly normal. Trivial mitral valve regurgitation.  Tricuspid Valve: The tricuspid valve is grossly normal. Tricuspid valve regurgitation is trivial.  Aortic Valve: The aortic valve is tricuspid. Aortic valve regurgitation is not visualized. No aortic stenosis is present. Aortic valve mean gradient measures 4.0 mmHg. Aortic valve peak gradient measures 7.1 mmHg. Aortic valve area, by VTI measures 3.05 cm.  Pulmonic Valve: The pulmonic valve was normal in structure. Pulmonic valve regurgitation is not visualized.  Aorta: The aortic root and ascending aorta are structurally normal, with no evidence of dilitation.  Venous: The inferior vena cava is normal in size with greater than 50% respiratory variability, suggesting right atrial pressure of 3 mmHg.  IAS/Shunts: No atrial level shunt detected  by color flow Doppler.  Additional Comments: 3D was performed not requiring image post processing on an independent workstation and was normal.   LEFT VENTRICLE PLAX 2D LVIDd:         4.70 cm   Diastology LVIDs:         3.20 cm   LV e' medial:    6.96 cm/s LV PW:         0.80 cm   LV E/e' medial:  14.8 LV IVS:        0.80 cm   LV e' lateral:   10.20 cm/s LVOT diam:     2.20 cm   LV E/e' lateral: 10.1 LV SV:         84 LV SV Index:   38        2D Longitudinal Strain LVOT Area:     3.80 cm  2D Strain GLS (A4C):   -11.6 % LV IVRT:       74 msec   2D Strain GLS (A3C):   -13.0 % 2D Strain GLS (A2C):   -  11.0 % 2D Strain GLS Avg:     -11.9 %  3D Volume EF: 3D EF:        56 % LV EDV:       214 ml LV ESV:       95 ml LV SV:        119 ml  RIGHT VENTRICLE             IVC RV Basal diam:  3.30 cm     IVC diam: 1.30 cm RV Mid diam:    2.70 cm RV S prime:     10.00 cm/s TAPSE (M-mode): 1.1 cm  LEFT ATRIUM             Index        RIGHT ATRIUM           Index LA diam:        2.80 cm 1.28 cm/m   RA Area:     20.70 cm LA Vol (A2C):   58.6 ml 26.86 ml/m  RA Volume:   50.90 ml  23.33 ml/m LA Vol (A4C):   57.5 ml 26.36 ml/m LA Biplane Vol: 62.9 ml 28.83 ml/m AORTIC VALVE AV Area (Vmax):    3.00 cm AV Area (Vmean):   2.94 cm AV Area (VTI):     3.05 cm AV Vmax:           133.00 cm/s AV Vmean:          87.000 cm/s AV VTI:            0.274 m AV Peak Grad:      7.1 mmHg AV Mean Grad:      4.0 mmHg LVOT Vmax:         105.00 cm/s LVOT Vmean:        67.400 cm/s LVOT VTI:          0.220 m LVOT/AV VTI ratio: 0.80  AORTA Ao Root diam: 3.40 cm Ao Asc diam:  3.70 cm  MITRAL VALVE MV Area (PHT): 4.15 cm     SHUNTS MV Decel Time: 183 msec     Systemic VTI:  0.22 m MV E velocity: 103.00 cm/s  Systemic Diam: 2.20 cm MV A velocity: 74.50 cm/s MV E/A ratio:  1.38  Vinie Maxcy MD Electronically signed by Vinie Maxcy MD Signature Date/Time: 03/24/2024/1:09:05 PM    Final    TEE  ECHO INTRAOPERATIVE TEE 03/30/2021  Narrative *INTRAOPERATIVE TRANSESOPHAGEAL REPORT *    Patient Name:   Jesus Cunningham Date of Exam: 03/30/2021 Medical Rec #:  [id number]        Height:       72.0 in Accession #:    [id number]       Weight:       202.8 lb Date of Birth:  [birth year]       BSA:          2.14 m Patient Age:    70 years         BP:           133/78 mmHg Patient Gender: M                HR:           53 bpm. Exam Location:  Anesthesiology  Transesophogeal exam was perform intraoperatively during surgical procedure. Patient was closely monitored under general anesthesia during the entirety of examination.  Indications:     Coronary Artery Disease Performing Phys: [id number] HARRELL  O LIGHTFOOT Diagnosing Phys: Cordella Fix  PROCEDURE: Intraoperative Transesophogeal Poor acoustic windows with resultant dropout and limited exam possible. Complications: No known complications during this procedure. POST-OP IMPRESSIONS _ Left Ventricle: The left ventricle is unchanged from pre-bypass. _ Right Ventricle: The right ventricle appears unchanged from pre-bypass. _ Aorta: The aorta appears unchanged from pre-bypass. _ Left Atrial Appendage: The left atrial appendage appears unchanged from pre-bypass. _ Aortic Valve: The aortic valve appears unchanged from pre-bypass. _ Mitral Valve: The mitral valve appears unchanged from pre-bypass. _ Tricuspid Valve: The tricuspid valve appears unchanged from pre-bypass. _ Pulmonic Valve: The pulmonic valve appears unchanged from pre-bypass. _ Interatrial Septum: The interatrial septum appears unchanged from pre-bypass. _ Pericardium: The pericardium appears unchanged from pre-bypass. _ Comments: S/P CABG X 3. No new or worsening wall motion or valvular issues. Preserved EF.  PRE-OP FINDINGS Left Ventricle: The left ventricle has normal systolic function, with an ejection fraction of 55-60%. The cavity size was normal. No  evidence of left ventricular regional wall motion abnormalities. There is no left ventricular hypertrophy. Left ventricular diastolic function could not be evaluated.   Right Ventricle: The right ventricle has normal systolic function. The cavity was normal. There is no increase in right ventricular wall thickness.  Left Atrium: Left atrial size was normal in size. No left atrial/left atrial appendage thrombus was detected. Left atrial appendage velocity is normal at greater than 40 cm/s.  Right Atrium: Right atrial size was normal in size.  Interatrial Septum: No atrial level shunt detected by color flow Doppler. The interatrial septum appears to be lipomatous. There is no evidence of a patent foramen ovale.  Pericardium: There is no evidence of pericardial effusion.  Mitral Valve: The mitral valve is normal in structure. Mitral valve regurgitation is not visualized by color flow Doppler. There is no evidence of mitral valve vegetation. There is No evidence of mitral stenosis.  Tricuspid Valve: The tricuspid valve was normal in structure. Tricuspid valve regurgitation was not visualized by color flow Doppler. No evidence of tricuspid stenosis is present. There is no evidence of tricuspid valve vegetation.  Aortic Valve: The aortic valve is tricuspid Aortic valve regurgitation is trivial by color flow Doppler. There is no stenosis of the aortic valve, with a calculated valve area of 2.52 cm. There is no evidence of aortic valve vegetation.   Pulmonic Valve: The pulmonic valve was normal in structure. Pulmonic valve regurgitation is not visualized by color flow Doppler.   Aorta: The ascending aorta and aortic root are normal in size and structure.  Pulmonary Artery: The pulmonary artery is of normal size.  Venous: The inferior vena cava was not well visualized.  Shunts: There is no evidence of an atrial septal defect.  +--------------+--------++ LEFT VENTRICLE          +--------------+--------++ PLAX 2D                +--------------+--------++ LVOT diam:    2.30 cm  +--------------+--------++ LVOT Area:    4.15 cm +--------------+--------++                        +--------------+--------++  +------------------+-----------++ AORTIC VALVE                  +------------------+-----------++ AV Area (Vmax):   2.55 cm    +------------------+-----------++ AV Area (Vmean):  2.11 cm    +------------------+-----------++ AV Area (VTI):    2.52 cm    +------------------+-----------++ AV Vmax:  111.00 cm/s +------------------+-----------++ AV Vmean:         82.000 cm/s +------------------+-----------++ AV VTI:           0.280 m     +------------------+-----------++ AV Peak Grad:     4.9 mmHg    +------------------+-----------++ AV Mean Grad:     3.0 mmHg    +------------------+-----------++ LVOT Vmax:        68.00 cm/s  +------------------+-----------++ LVOT Vmean:       41.700 cm/s +------------------+-----------++ LVOT VTI:         0.170 m     +------------------+-----------++ LVOT/AV VTI ratio:0.61        +------------------+-----------++  +-------------+-------++ AORTA                +-------------+-------++ Ao Root diam:3.80 cm +-------------+-------++ Ao STJ diam: 3.3 cm  +-------------+-------++ Ao Asc diam: 3.80 cm +-------------+-------++  +--------------+----------++ MITRAL VALVE              +--------------+-------+ +--------------+----------++  SHUNTS                MV Area (PHT):4.89 cm    +--------------+-------+ +--------------+----------++  Systemic VTI: 0.17 m  MV Peak grad: 1.3 mmHg    +--------------+-------+ +--------------+----------++  Systemic Diam:2.30 cm MV Mean grad: 0.0 mmHg    +--------------+-------+ +--------------+----------++ MV Vmax:      0.58 m/s    +--------------+----------++ MV Vmean:     23.0 cm/s  +--------------+----------++ MV PHT:       44.95 msec +--------------+----------++ MV Decel Time:155 msec   +--------------+----------++ +--------------+----------++ MV E velocity:52.20 cm/s +--------------+----------++ MV A velocity:55.60 cm/s +--------------+----------++ MV E/A ratio: 0.94       +--------------+----------++   Cordella Fix Electronically signed by Cordella Fix Signature Date/Time: 03/30/2021/1:18:17 PM    Final  MONITORS  LONG TERM MONITOR (3-14 DAYS) 11/07/2023  Narrative   1 episode of NSVT lasting 8 beats   Occasional PVCs (3.5%)   Occasional PACs (1.8%)   Patch Wear Time:  3 days and 0 hours (2025-07-14T06:27:56-0400 to 2025-07-17T06:42:16-0400)  Patient had a min HR of 44 bpm, max HR of 154 bpm, and avg HR of 64 bpm. Predominant underlying rhythm was Sinus Rhythm. First Degree AV Block was present. 1 run of Ventricular Tachycardia occurred lasting 8 beats with a max rate of 154 bpm (avg 134 bpm). Isolated SVEs were occasional (1.8%, 5043), and no SVE Couplets or SVE Triplets were present. Isolated VEs were occasional (3.5%, 9572), VE Couplets were rare (<1.0%, 75), and VE Triplets were rare (<1.0%, 6). Ventricular Bigeminy and Trigeminy were present.  No patient triggered events       ______________________________________________________________________________________________                   Physical Exam: VS: BP 128/88 (BP Location: Right Arm, Patient Position: Sitting, Cuff Size: Normal)   Pulse 69   Ht 6' (1.829 m)   Wt 209 lb (94.8 kg)   BMI 28.35 kg/m   GEN: Well nourished, in NAD HEENT: Normal NECK: No JVD CARDIAC: RRR, no murmurs, rubs, gallops RESPIRATORY: Clear to auscultation without rales, wheezing or rhonchi  ABDOMEN: Soft, non-tender, non-distended MUSCULOSKELETAL: No edema SKIN: Warm and dry NEUROLOGIC:  Alert and  oriented x 3 PSYCHIATRIC:  Very pleasant, normal affect   Assessment & Plan: 1. CAD: S/p CABGx3 03/30/2021 and recent STEMI 03/23/2024 that is being medically managed. EKG today without acute ischemic changes. Denies persistent chest pain, DOE, abnormal bleeding since discharge. Discussed utilizing SL nitroglycerin  for chest  discomfort and presenting back to the ER if he has pain that does not cease. - Plan DAPT with aspirin  and Brilinta  for one year followed by Plavix  monotherapy thereafter per Dr. Elmira - Continue rosuvastatin  20 mg daily and ezetimibe  10 mg daily - Cleared to participate in cardiac rehab - prefers this to be done at [facility] - GDMT with beta blocker limited by bradycardia in the past  2. HFpEF: Echo 03/24/2024 LVEF 55-60%, RV normal, trivial MR. Appears euvolemic on exam. - Check CMP today since starting Jardiance  during hospitalization - Continue Jardiance  10 mg daily  3. Hypertension: Had intermittent hypotension during hospitalization and amlodipine  and hydrochlorothiazide  were discontinued. BP today 128/88, similar to home readings. One episode of lightheadedness last night likely related to his URI, BP was 118/71. Denies syncope.  - Check CMP today - Continue lisinopril  20 mg daily  4. Hyperlipidemia: 03/23/2024 LDL 45, HDL 74, TG 69, total 131, AST 100, ALT 32. 03/24/2024 LPA 22.8. - Check CMP today to ensure LFTs trending down from hospitalization - Continue rosuvastatin  20 mg daily and ezetimibe  10 mg daily    Dispo: Follow-up with Dr. Kate in 2-3 months or sooner if needed.  Signed, Saddie GORMAN Cleaves, NP 04/15/2024 8:54 AM [facility] "

## 2024-04-15 ENCOUNTER — Ambulatory Visit: Attending: Cardiology

## 2024-04-15 ENCOUNTER — Encounter: Payer: Self-pay | Admitting: Cardiology

## 2024-04-15 VITALS — BP 128/88 | HR 69 | Ht 72.0 in | Wt 209.0 lb

## 2024-04-15 DIAGNOSIS — I25810 Atherosclerosis of coronary artery bypass graft(s) without angina pectoris: Secondary | ICD-10-CM

## 2024-04-15 DIAGNOSIS — I5032 Chronic diastolic (congestive) heart failure: Secondary | ICD-10-CM

## 2024-04-15 DIAGNOSIS — I10 Essential (primary) hypertension: Secondary | ICD-10-CM | POA: Diagnosis not present

## 2024-04-15 DIAGNOSIS — E782 Mixed hyperlipidemia: Secondary | ICD-10-CM | POA: Diagnosis not present

## 2024-04-15 LAB — COMPREHENSIVE METABOLIC PANEL WITH GFR
ALT: 33 IU/L (ref 0–44)
AST: 33 IU/L (ref 0–40)
Albumin: 4.3 g/dL (ref 3.8–4.8)
Alkaline Phosphatase: 103 IU/L (ref 47–123)
BUN/Creatinine Ratio: 8 — ABNORMAL LOW (ref 10–24)
BUN: 8 mg/dL (ref 8–27)
Bilirubin Total: 0.6 mg/dL (ref 0.0–1.2)
CO2: 17 mmol/L — ABNORMAL LOW (ref 20–29)
Calcium: 9 mg/dL (ref 8.6–10.2)
Chloride: 99 mmol/L (ref 96–106)
Creatinine, Ser: 1.02 mg/dL (ref 0.76–1.27)
Globulin, Total: 2.7 g/dL (ref 1.5–4.5)
Glucose: 99 mg/dL (ref 70–99)
Potassium: 4.2 mmol/L (ref 3.5–5.2)
Sodium: 133 mmol/L — ABNORMAL LOW (ref 134–144)
Total Protein: 7 g/dL (ref 6.0–8.5)
eGFR: 78 mL/min/1.73

## 2024-04-15 NOTE — Patient Instructions (Addendum)
 Medication Instructions:  NO CHANGES.  Lab Work: CMET TO BE DONE TODAY.  Testing/Procedures: NONE  Follow-Up: At [facility], you and your health needs are our priority.  As part of our continuing mission to provide you with exceptional heart care, our providers are all part of one team.  This team includes your primary Cardiologist (physician) and Advanced Practice Providers or APPs (Physician Assistants and Nurse Practitioners) who all work together to provide you with the care you need, when you need it.  Your next appointment:   3 MONTHS  Provider:   Lonni LITTIE Nanas, MD     Other Instructions:  SOMEONE FROM CARDIAC REHAB WILL BE CONTACTING YOU TO GET YOU SCHEDULED.

## 2024-04-16 ENCOUNTER — Ambulatory Visit: Payer: Self-pay

## 2024-04-20 ENCOUNTER — Other Ambulatory Visit: Payer: Self-pay | Admitting: Cardiology

## 2024-04-20 DIAGNOSIS — I214 Non-ST elevation (NSTEMI) myocardial infarction: Secondary | ICD-10-CM

## 2024-04-23 ENCOUNTER — Other Ambulatory Visit (HOSPITAL_COMMUNITY): Payer: Self-pay

## 2024-05-02 NOTE — Telephone Encounter (Signed)
 Routing message. I saw an active referral in pts chart. I attempted to contact [facility] in [place] [phone]) but wasn't able to get an answer.

## 2024-05-14 ENCOUNTER — Telehealth: Payer: Self-pay | Admitting: *Deleted

## 2024-05-14 NOTE — Telephone Encounter (Signed)
 Paperwork faxed to [facility] and patient made aware.

## 2024-06-26 ENCOUNTER — Ambulatory Visit: Admitting: Cardiology
